# Patient Record
Sex: Female | Born: 1945
Health system: Southern US, Community
[De-identification: ages and names within clinical notes are randomized; demographics above are authoritative.]

## PROBLEM LIST (undated history)

## (undated) DIAGNOSIS — D75839 Thrombocytosis, unspecified: Secondary | ICD-10-CM

## (undated) DIAGNOSIS — D473 Essential (hemorrhagic) thrombocythemia: Secondary | ICD-10-CM

## (undated) DIAGNOSIS — F419 Anxiety disorder, unspecified: Secondary | ICD-10-CM

## (undated) DIAGNOSIS — G40909 Epilepsy, unspecified, not intractable, without status epilepticus: Secondary | ICD-10-CM

## (undated) DIAGNOSIS — I341 Nonrheumatic mitral (valve) prolapse: Secondary | ICD-10-CM

## (undated) DIAGNOSIS — M810 Age-related osteoporosis without current pathological fracture: Secondary | ICD-10-CM

## (undated) DIAGNOSIS — D126 Benign neoplasm of colon, unspecified: Secondary | ICD-10-CM

## (undated) DIAGNOSIS — D509 Iron deficiency anemia, unspecified: Secondary | ICD-10-CM

## (undated) DIAGNOSIS — K219 Gastro-esophageal reflux disease without esophagitis: Secondary | ICD-10-CM

## (undated) DIAGNOSIS — D472 Monoclonal gammopathy: Secondary | ICD-10-CM

## (undated) DIAGNOSIS — IMO0002 Reserved for concepts with insufficient information to code with codable children: Secondary | ICD-10-CM

## (undated) DIAGNOSIS — I10 Essential (primary) hypertension: Secondary | ICD-10-CM

## (undated) DIAGNOSIS — G40B09 Juvenile myoclonic epilepsy, not intractable, without status epilepticus: Secondary | ICD-10-CM

## (undated) DIAGNOSIS — C9000 Multiple myeloma not having achieved remission: Secondary | ICD-10-CM

## (undated) HISTORY — DX: Juvenile myoclonic epilepsy, not intractable, without status epilepticus: G40.B09

## (undated) HISTORY — DX: Gastro-esophageal reflux disease without esophagitis: K21.9

## (undated) HISTORY — DX: Thrombocytosis, unspecified: D75.839

## (undated) HISTORY — DX: Essential (primary) hypertension: I10

## (undated) HISTORY — DX: Reserved for concepts with insufficient information to code with codable children: IMO0002

## (undated) HISTORY — DX: Nonrheumatic mitral (valve) prolapse: I34.1

## (undated) HISTORY — DX: Multiple myeloma not having achieved remission: C90.00

## (undated) HISTORY — DX: Iron deficiency anemia, unspecified: D50.9

## (undated) HISTORY — DX: Benign neoplasm of colon, unspecified: D12.6

## (undated) HISTORY — DX: Age-related osteoporosis without current pathological fracture: M81.0

## (undated) HISTORY — DX: Anxiety disorder, unspecified: F41.9

## (undated) HISTORY — DX: Epilepsy, unspecified, not intractable, without status epilepticus: G40.909

## (undated) HISTORY — DX: Essential (hemorrhagic) thrombocythemia: D47.3

## (undated) HISTORY — DX: Monoclonal gammopathy: D47.2

---
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
## 1990-11-01 HISTORY — PX: BREAST BIOPSY: SHX20

## 1999-07-23 ENCOUNTER — Other Ambulatory Visit: Admission: RE | Admit: 1999-07-23 | Discharge: 1999-07-23 | Payer: Self-pay | Admitting: Obstetrics and Gynecology

## 2000-06-30 ENCOUNTER — Encounter: Payer: Self-pay | Admitting: Obstetrics and Gynecology

## 2000-06-30 ENCOUNTER — Encounter: Admission: RE | Admit: 2000-06-30 | Discharge: 2000-06-30 | Payer: Self-pay | Admitting: Obstetrics and Gynecology

## 2000-08-03 ENCOUNTER — Other Ambulatory Visit: Admission: RE | Admit: 2000-08-03 | Discharge: 2000-08-03 | Payer: Self-pay | Admitting: Obstetrics and Gynecology

## 2001-07-18 ENCOUNTER — Encounter: Admission: RE | Admit: 2001-07-18 | Discharge: 2001-07-18 | Payer: Self-pay | Admitting: Obstetrics and Gynecology

## 2001-07-18 ENCOUNTER — Encounter: Payer: Self-pay | Admitting: Obstetrics and Gynecology

## 2001-09-05 ENCOUNTER — Other Ambulatory Visit: Admission: RE | Admit: 2001-09-05 | Discharge: 2001-09-05 | Payer: Self-pay | Admitting: Obstetrics and Gynecology

## 2002-07-19 ENCOUNTER — Encounter: Payer: Self-pay | Admitting: Obstetrics and Gynecology

## 2002-07-19 ENCOUNTER — Encounter: Admission: RE | Admit: 2002-07-19 | Discharge: 2002-07-19 | Payer: Self-pay | Admitting: Obstetrics and Gynecology

## 2002-09-17 ENCOUNTER — Encounter: Payer: Self-pay | Admitting: Family Medicine

## 2002-09-17 ENCOUNTER — Ambulatory Visit (HOSPITAL_COMMUNITY): Admission: RE | Admit: 2002-09-17 | Discharge: 2002-09-17 | Payer: Self-pay | Admitting: Family Medicine

## 2002-11-07 ENCOUNTER — Other Ambulatory Visit: Admission: RE | Admit: 2002-11-07 | Discharge: 2002-11-07 | Payer: Self-pay | Admitting: Obstetrics and Gynecology

## 2003-01-15 ENCOUNTER — Ambulatory Visit (HOSPITAL_COMMUNITY)
Admission: RE | Admit: 2003-01-15 | Discharge: 2003-01-15 | Payer: Self-pay | Admitting: Thoracic Surgery (Cardiothoracic Vascular Surgery)

## 2003-01-15 ENCOUNTER — Encounter: Payer: Self-pay | Admitting: Thoracic Surgery (Cardiothoracic Vascular Surgery)

## 2003-01-15 ENCOUNTER — Encounter (INDEPENDENT_AMBULATORY_CARE_PROVIDER_SITE_OTHER): Payer: Self-pay | Admitting: *Deleted

## 2003-07-22 ENCOUNTER — Encounter: Payer: Self-pay | Admitting: Obstetrics and Gynecology

## 2003-07-22 ENCOUNTER — Encounter: Admission: RE | Admit: 2003-07-22 | Discharge: 2003-07-22 | Payer: Self-pay | Admitting: Obstetrics and Gynecology

## 2003-12-18 ENCOUNTER — Other Ambulatory Visit: Admission: RE | Admit: 2003-12-18 | Discharge: 2003-12-18 | Payer: Self-pay | Admitting: Obstetrics and Gynecology

## 2004-07-27 ENCOUNTER — Encounter: Admission: RE | Admit: 2004-07-27 | Discharge: 2004-07-27 | Payer: Self-pay | Admitting: Obstetrics and Gynecology

## 2004-12-14 ENCOUNTER — Ambulatory Visit (HOSPITAL_COMMUNITY): Admission: RE | Admit: 2004-12-14 | Discharge: 2004-12-14 | Payer: Self-pay | Admitting: Family Medicine

## 2005-03-10 ENCOUNTER — Ambulatory Visit: Payer: Self-pay | Admitting: Oncology

## 2005-07-14 ENCOUNTER — Other Ambulatory Visit: Admission: RE | Admit: 2005-07-14 | Discharge: 2005-07-14 | Payer: Self-pay | Admitting: Obstetrics and Gynecology

## 2005-08-05 ENCOUNTER — Encounter: Admission: RE | Admit: 2005-08-05 | Discharge: 2005-08-05 | Payer: Self-pay | Admitting: Obstetrics and Gynecology

## 2005-12-28 ENCOUNTER — Encounter: Admission: RE | Admit: 2005-12-28 | Discharge: 2005-12-28 | Payer: Self-pay | Admitting: Neurosurgery

## 2005-12-29 ENCOUNTER — Encounter: Admission: RE | Admit: 2005-12-29 | Discharge: 2005-12-29 | Payer: Self-pay | Admitting: Neurosurgery

## 2006-08-08 ENCOUNTER — Encounter: Admission: RE | Admit: 2006-08-08 | Discharge: 2006-08-08 | Payer: Self-pay | Admitting: Obstetrics and Gynecology

## 2007-08-10 ENCOUNTER — Encounter: Admission: RE | Admit: 2007-08-10 | Discharge: 2007-08-10 | Payer: Self-pay | Admitting: Obstetrics and Gynecology

## 2008-08-12 ENCOUNTER — Encounter: Admission: RE | Admit: 2008-08-12 | Discharge: 2008-08-12 | Payer: Self-pay | Admitting: Obstetrics and Gynecology

## 2008-11-25 ENCOUNTER — Ambulatory Visit: Payer: Self-pay | Admitting: Internal Medicine

## 2008-12-06 ENCOUNTER — Encounter: Payer: Self-pay | Admitting: Internal Medicine

## 2008-12-06 ENCOUNTER — Ambulatory Visit: Payer: Self-pay | Admitting: Internal Medicine

## 2008-12-09 ENCOUNTER — Encounter: Payer: Self-pay | Admitting: Internal Medicine

## 2009-08-11 ENCOUNTER — Encounter: Admission: RE | Admit: 2009-08-11 | Discharge: 2009-08-11 | Payer: Self-pay | Admitting: Neurosurgery

## 2009-08-13 ENCOUNTER — Encounter: Admission: RE | Admit: 2009-08-13 | Discharge: 2009-08-13 | Payer: Self-pay | Admitting: Obstetrics and Gynecology

## 2009-09-02 ENCOUNTER — Ambulatory Visit: Payer: Self-pay | Admitting: Oncology

## 2009-09-11 LAB — IRON AND TIBC
%SAT: 51 % (ref 20–55)
Iron: 161 ug/dL — ABNORMAL HIGH (ref 42–145)

## 2009-09-11 LAB — CBC WITH DIFFERENTIAL/PLATELET
BASO%: 0.9 % (ref 0.0–2.0)
Eosinophils Absolute: 0.1 10*3/uL (ref 0.0–0.5)
LYMPH%: 51 % — ABNORMAL HIGH (ref 14.0–49.7)
MCHC: 33.5 g/dL (ref 31.5–36.0)
MCV: 86.7 fL (ref 79.5–101.0)
MONO%: 9.7 % (ref 0.0–14.0)
Platelets: 107 10*3/uL — ABNORMAL LOW (ref 145–400)
RBC: 3.61 10*6/uL — ABNORMAL LOW (ref 3.70–5.45)
nRBC: 0 % (ref 0–0)

## 2009-09-11 LAB — RETICULOCYTES
Immature Retic Fract: 1.8 % (ref 0.00–10.70)
RBC: 3.63 10*6/uL — ABNORMAL LOW (ref 3.70–5.45)
Retic %: 0.95 % (ref 0.50–1.50)

## 2009-09-11 LAB — MORPHOLOGY: PLT EST: DECREASED

## 2009-09-16 LAB — COMPREHENSIVE METABOLIC PANEL
ALT: 32 U/L (ref 0–35)
BUN: 23 mg/dL (ref 6–23)
CO2: 21 mEq/L (ref 19–32)
Creatinine, Ser: 1.03 mg/dL (ref 0.40–1.20)
Total Bilirubin: 0.4 mg/dL (ref 0.3–1.2)

## 2009-09-16 LAB — SEDIMENTATION RATE: Sed Rate: 10 mm/hr (ref 0–22)

## 2009-09-16 LAB — JAK2 GENOTYPR: JAK2 GenotypR: NOT DETECTED

## 2009-09-16 LAB — LACTATE DEHYDROGENASE: LDH: 146 U/L (ref 94–250)

## 2009-09-17 LAB — CBC WITH DIFFERENTIAL/PLATELET
BASO%: 0.7 % (ref 0.0–2.0)
Eosinophils Absolute: 0.1 10*3/uL (ref 0.0–0.5)
MCHC: 33.3 g/dL (ref 31.5–36.0)
MONO#: 0.5 10*3/uL (ref 0.1–0.9)
NEUT#: 1.7 10*3/uL (ref 1.5–6.5)
RBC: 3.08 10*6/uL — ABNORMAL LOW (ref 3.70–5.45)
WBC: 4.9 10*3/uL (ref 3.9–10.3)
lymph#: 2.6 10*3/uL (ref 0.9–3.3)

## 2009-09-24 LAB — CBC WITH DIFFERENTIAL/PLATELET
BASO%: 1.1 % (ref 0.0–2.0)
Basophils Absolute: 0 10*3/uL (ref 0.0–0.1)
Eosinophils Absolute: 0.2 10*3/uL (ref 0.0–0.5)
HCT: 28.8 % — ABNORMAL LOW (ref 34.8–46.6)
HGB: 9.6 g/dL — ABNORMAL LOW (ref 11.6–15.9)
MONO#: 0.3 10*3/uL (ref 0.1–0.9)
NEUT#: 2.1 10*3/uL (ref 1.5–6.5)
NEUT%: 47.1 % (ref 38.4–76.8)
Platelets: 397 10*3/uL (ref 145–400)
WBC: 4.5 10*3/uL (ref 3.9–10.3)
lymph#: 1.8 10*3/uL (ref 0.9–3.3)

## 2009-10-01 LAB — CBC WITH DIFFERENTIAL/PLATELET
Basophils Absolute: 0 10*3/uL (ref 0.0–0.1)
HCT: 28.7 % — ABNORMAL LOW (ref 34.8–46.6)
HGB: 9.9 g/dL — ABNORMAL LOW (ref 11.6–15.9)
MONO#: 0.4 10*3/uL (ref 0.1–0.9)
NEUT#: 2.1 10*3/uL (ref 1.5–6.5)
NEUT%: 52.9 % (ref 38.4–76.8)
RDW: 15 % — ABNORMAL HIGH (ref 11.2–14.5)
WBC: 4 10*3/uL (ref 3.9–10.3)
lymph#: 1.3 10*3/uL (ref 0.9–3.3)

## 2009-10-03 ENCOUNTER — Ambulatory Visit: Payer: Self-pay | Admitting: Oncology

## 2009-10-07 LAB — COMPREHENSIVE METABOLIC PANEL
ALT: 32 U/L (ref 0–35)
Albumin: 3.2 g/dL — ABNORMAL LOW (ref 3.5–5.2)
CO2: 27 mEq/L (ref 19–32)
Calcium: 8.4 mg/dL (ref 8.4–10.5)
Chloride: 102 mEq/L (ref 96–112)
Creatinine, Ser: 1.19 mg/dL (ref 0.40–1.20)
Potassium: 4.7 mEq/L (ref 3.5–5.3)
Sodium: 135 mEq/L (ref 135–145)
Total Protein: 6 g/dL (ref 6.0–8.3)

## 2009-10-07 LAB — CBC WITH DIFFERENTIAL/PLATELET
BASO%: 0.8 % (ref 0.0–2.0)
HCT: 28.9 % — ABNORMAL LOW (ref 34.8–46.6)
MCHC: 33.9 g/dL (ref 31.5–36.0)
MONO#: 0.4 10*3/uL (ref 0.1–0.9)
NEUT%: 31.3 % — ABNORMAL LOW (ref 38.4–76.8)
RDW: 14.3 % (ref 11.2–14.5)
WBC: 4.3 10*3/uL (ref 3.9–10.3)
lymph#: 2.4 10*3/uL (ref 0.9–3.3)

## 2009-10-07 LAB — LACTATE DEHYDROGENASE: LDH: 136 U/L (ref 94–250)

## 2009-10-08 ENCOUNTER — Encounter: Admission: RE | Admit: 2009-10-08 | Discharge: 2009-10-08 | Payer: Self-pay | Admitting: Neurosurgery

## 2009-10-13 LAB — IMMUNOFIXATION ELECTROPHORESIS

## 2009-10-15 LAB — CREATININE CLEARANCE, URINE, 24 HOUR
Collection Interval-CRCL: 24 hours
Creatinine Clearance: 64 mL/min — ABNORMAL LOW (ref 75–115)
Creatinine, Urine: 47 mg/dL

## 2009-10-15 LAB — UIFE/LIGHT CHAINS/TP QN, 24-HR UR
Alpha 2, Urine: DETECTED — AB
Beta, Urine: DETECTED — AB
Free Kappa Lt Chains,Ur: 1.98 mg/dL — ABNORMAL HIGH (ref 0.04–1.51)
Free Kappa/Lambda Ratio: 6 ratio — ABNORMAL HIGH (ref 0.46–4.00)
Free Lt Chn Excr Rate: 46.53 mg/d
Total Protein, Urine: 2.8 mg/dL

## 2009-10-27 LAB — CBC WITH DIFFERENTIAL/PLATELET
BASO%: 1.4 % (ref 0.0–2.0)
MCHC: 33.1 g/dL (ref 31.5–36.0)
MONO#: 0.4 10*3/uL (ref 0.1–0.9)
RBC: 3.62 10*6/uL — ABNORMAL LOW (ref 3.70–5.45)
RDW: 13.7 % (ref 11.2–14.5)
WBC: 3.6 10*3/uL — ABNORMAL LOW (ref 3.9–10.3)
lymph#: 2.2 10*3/uL (ref 0.9–3.3)

## 2009-10-30 LAB — CBC WITH DIFFERENTIAL/PLATELET
BASO%: 0.9 % (ref 0.0–2.0)
Basophils Absolute: 0 10*3/uL (ref 0.0–0.1)
HCT: 30 % — ABNORMAL LOW (ref 34.8–46.6)
HGB: 10.2 g/dL — ABNORMAL LOW (ref 11.6–15.9)
MONO#: 0.4 10*3/uL (ref 0.1–0.9)
NEUT%: 42.2 % (ref 38.4–76.8)
WBC: 4.5 10*3/uL (ref 3.9–10.3)
lymph#: 2 10*3/uL (ref 0.9–3.3)

## 2009-10-30 LAB — MORPHOLOGY: PLT EST: ADEQUATE

## 2009-11-04 ENCOUNTER — Ambulatory Visit: Payer: Self-pay | Admitting: Oncology

## 2009-11-04 ENCOUNTER — Ambulatory Visit (HOSPITAL_COMMUNITY): Admission: RE | Admit: 2009-11-04 | Discharge: 2009-11-04 | Payer: Self-pay | Admitting: Oncology

## 2009-11-06 ENCOUNTER — Ambulatory Visit: Payer: Self-pay | Admitting: Oncology

## 2009-11-10 LAB — COMPREHENSIVE METABOLIC PANEL
ALT: 37 U/L — ABNORMAL HIGH (ref 0–35)
AST: 24 U/L (ref 0–37)
Calcium: 8.5 mg/dL (ref 8.4–10.5)
Chloride: 102 mEq/L (ref 96–112)
Creatinine, Ser: 1 mg/dL (ref 0.40–1.20)
Sodium: 134 mEq/L — ABNORMAL LOW (ref 135–145)

## 2009-11-10 LAB — CBC WITH DIFFERENTIAL/PLATELET
BASO%: 0.8 % (ref 0.0–2.0)
EOS%: 2.2 % (ref 0.0–7.0)
HCT: 31 % — ABNORMAL LOW (ref 34.8–46.6)
MCH: 31 pg (ref 25.1–34.0)
MCHC: 34 g/dL (ref 31.5–36.0)
NEUT%: 44.3 % (ref 38.4–76.8)
RBC: 3.39 10*6/uL — ABNORMAL LOW (ref 3.70–5.45)
RDW: 14.3 % (ref 11.2–14.5)
lymph#: 1.4 10*3/uL (ref 0.9–3.3)

## 2009-11-18 ENCOUNTER — Ambulatory Visit (HOSPITAL_COMMUNITY): Admission: RE | Admit: 2009-11-18 | Discharge: 2009-11-18 | Payer: Self-pay | Admitting: Oncology

## 2009-12-09 ENCOUNTER — Ambulatory Visit: Payer: Self-pay | Admitting: Oncology

## 2009-12-11 LAB — CBC WITH DIFFERENTIAL/PLATELET
BASO%: 1.1 % (ref 0.0–2.0)
Basophils Absolute: 0 10*3/uL (ref 0.0–0.1)
Eosinophils Absolute: 0.1 10*3/uL (ref 0.0–0.5)
HCT: 33 % — ABNORMAL LOW (ref 34.8–46.6)
HGB: 11.3 g/dL — ABNORMAL LOW (ref 11.6–15.9)
LYMPH%: 49.8 % — ABNORMAL HIGH (ref 14.0–49.7)
MONO#: 0.4 10*3/uL (ref 0.1–0.9)
NEUT#: 1.4 10*3/uL — ABNORMAL LOW (ref 1.5–6.5)
NEUT%: 37 % — ABNORMAL LOW (ref 38.4–76.8)
Platelets: 354 10*3/uL (ref 145–400)
WBC: 3.7 10*3/uL — ABNORMAL LOW (ref 3.9–10.3)
lymph#: 1.8 10*3/uL (ref 0.9–3.3)

## 2010-01-08 ENCOUNTER — Ambulatory Visit: Payer: Self-pay | Admitting: Oncology

## 2010-01-08 LAB — CBC WITH DIFFERENTIAL/PLATELET
BASO%: 1 % (ref 0.0–2.0)
Eosinophils Absolute: 0.1 10*3/uL (ref 0.0–0.5)
LYMPH%: 58.7 % — ABNORMAL HIGH (ref 14.0–49.7)
MCHC: 34.3 g/dL (ref 31.5–36.0)
MONO#: 0.4 10*3/uL (ref 0.1–0.9)
NEUT#: 1.3 10*3/uL — ABNORMAL LOW (ref 1.5–6.5)
Platelets: 332 10*3/uL (ref 145–400)
RBC: 3.46 10*6/uL — ABNORMAL LOW (ref 3.70–5.45)
WBC: 4.6 10*3/uL (ref 3.9–10.3)
lymph#: 2.7 10*3/uL (ref 0.9–3.3)

## 2010-02-12 ENCOUNTER — Ambulatory Visit: Payer: Self-pay | Admitting: Oncology

## 2010-02-16 LAB — CBC WITH DIFFERENTIAL/PLATELET
BASO%: 1 % (ref 0.0–2.0)
Basophils Absolute: 0 10*3/uL (ref 0.0–0.1)
EOS%: 1.8 % (ref 0.0–7.0)
Eosinophils Absolute: 0.1 10*3/uL (ref 0.0–0.5)
HCT: 32.1 % — ABNORMAL LOW (ref 34.8–46.6)
HGB: 11.1 g/dL — ABNORMAL LOW (ref 11.6–15.9)
LYMPH%: 40.2 % (ref 14.0–49.7)
MCH: 31.6 pg (ref 25.1–34.0)
MCHC: 34.6 g/dL (ref 31.5–36.0)
MCV: 91.3 fL (ref 79.5–101.0)
MONO%: 10.4 % (ref 0.0–14.0)
NEUT#: 1.7 10*3/uL (ref 1.5–6.5)
NEUT%: 46.6 % (ref 38.4–76.8)
Platelets: 332 10*3/uL (ref 145–400)
RDW: 14.1 % (ref 11.2–14.5)

## 2010-02-17 LAB — PROTEIN / CREATININE RATIO, URINE
Creatinine, Urine: 147.5 mg/dL
Protein Creatinine Ratio: 0.05 (ref <0.15)

## 2010-02-25 LAB — COMPREHENSIVE METABOLIC PANEL
AST: 22 U/L (ref 0–37)
Albumin: 4.2 g/dL (ref 3.5–5.2)
Alkaline Phosphatase: 108 U/L (ref 39–117)
BUN: 33 mg/dL — ABNORMAL HIGH (ref 6–23)
Calcium: 8.7 mg/dL (ref 8.4–10.5)
Creatinine, Ser: 1.16 mg/dL (ref 0.40–1.20)
Glucose, Bld: 111 mg/dL — ABNORMAL HIGH (ref 70–99)

## 2010-02-25 LAB — IRON AND TIBC
TIBC: 315 ug/dL (ref 250–470)
UIBC: 149 ug/dL

## 2010-02-25 LAB — IGG, IGA, IGM
IgA: 100 mg/dL (ref 68–378)
IgG (Immunoglobin G), Serum: 629 mg/dL — ABNORMAL LOW (ref 694–1618)
IgM, Serum: 47 mg/dL — ABNORMAL LOW (ref 60–263)

## 2010-02-25 LAB — TRANSFERRIN RECEPTOR, SOLUABLE: Transferrin Receptor, Soluble: 8.9 nmol/L

## 2010-03-18 ENCOUNTER — Ambulatory Visit: Payer: Self-pay | Admitting: Oncology

## 2010-03-19 LAB — CBC WITH DIFFERENTIAL/PLATELET
Basophils Absolute: 0 10*3/uL (ref 0.0–0.1)
EOS%: 2.3 % (ref 0.0–7.0)
Eosinophils Absolute: 0.1 10*3/uL (ref 0.0–0.5)
HCT: 32.5 % — ABNORMAL LOW (ref 34.8–46.6)
HGB: 11.3 g/dL — ABNORMAL LOW (ref 11.6–15.9)
MCH: 31.6 pg (ref 25.1–34.0)
MONO#: 0.6 10*3/uL (ref 0.1–0.9)
NEUT#: 1.6 10*3/uL (ref 1.5–6.5)
NEUT%: 33.1 % — ABNORMAL LOW (ref 38.4–76.8)
lymph#: 2.6 10*3/uL (ref 0.9–3.3)

## 2010-04-16 LAB — CBC WITH DIFFERENTIAL/PLATELET
BASO%: 0.8 % (ref 0.0–2.0)
HCT: 32.1 % — ABNORMAL LOW (ref 34.8–46.6)
HGB: 11.2 g/dL — ABNORMAL LOW (ref 11.6–15.9)
MCHC: 34.9 g/dL (ref 31.5–36.0)
MONO#: 0.5 10*3/uL (ref 0.1–0.9)
NEUT%: 37.1 % — ABNORMAL LOW (ref 38.4–76.8)
RDW: 13.8 % (ref 11.2–14.5)
WBC: 4.6 10*3/uL (ref 3.9–10.3)
lymph#: 2.2 10*3/uL (ref 0.9–3.3)

## 2010-05-19 ENCOUNTER — Ambulatory Visit: Payer: Self-pay | Admitting: Oncology

## 2010-05-21 LAB — CBC WITH DIFFERENTIAL/PLATELET
Basophils Absolute: 0 10*3/uL (ref 0.0–0.1)
Eosinophils Absolute: 0.1 10*3/uL (ref 0.0–0.5)
HGB: 11.3 g/dL — ABNORMAL LOW (ref 11.6–15.9)
LYMPH%: 51.9 % — ABNORMAL HIGH (ref 14.0–49.7)
MCV: 90.7 fL (ref 79.5–101.0)
MONO%: 14.2 % — ABNORMAL HIGH (ref 0.0–14.0)
NEUT#: 1.2 10*3/uL — ABNORMAL LOW (ref 1.5–6.5)
Platelets: 388 10*3/uL (ref 145–400)
RBC: 3.64 10*6/uL — ABNORMAL LOW (ref 3.70–5.45)

## 2010-05-22 LAB — COMPREHENSIVE METABOLIC PANEL
Alkaline Phosphatase: 105 U/L (ref 39–117)
BUN: 29 mg/dL — ABNORMAL HIGH (ref 6–23)
Creatinine, Ser: 1.09 mg/dL (ref 0.40–1.20)
Glucose, Bld: 111 mg/dL — ABNORMAL HIGH (ref 70–99)
Total Bilirubin: 0.3 mg/dL (ref 0.3–1.2)

## 2010-05-22 LAB — IRON AND TIBC: Iron: 127 ug/dL (ref 42–145)

## 2010-05-22 LAB — FERRITIN: Ferritin: 129 ng/mL (ref 10–291)

## 2010-05-22 LAB — PROTEIN / CREATININE RATIO, URINE: Protein Creatinine Ratio: 0.06 (ref <0.15)

## 2010-05-22 LAB — IGG, IGA, IGM
IgA: 91 mg/dL (ref 68–378)
IgG (Immunoglobin G), Serum: 650 mg/dL — ABNORMAL LOW (ref 694–1618)

## 2010-05-22 LAB — TRANSFERRIN RECEPTOR, SOLUABLE: Transferrin Receptor, Soluble: 7.8 nmol/L

## 2010-08-18 ENCOUNTER — Encounter: Admission: RE | Admit: 2010-08-18 | Discharge: 2010-08-18 | Payer: Self-pay | Admitting: Obstetrics and Gynecology

## 2010-09-17 ENCOUNTER — Ambulatory Visit: Payer: Self-pay | Admitting: Oncology

## 2010-09-21 LAB — PROTEIN / CREATININE RATIO, URINE
Creatinine, Urine: 83.9 mg/dL
Protein Creatinine Ratio: 0.07 (ref <0.15)

## 2010-09-21 LAB — CBC WITH DIFFERENTIAL/PLATELET
Basophils Absolute: 0 10*3/uL (ref 0.0–0.1)
Eosinophils Absolute: 0.1 10*3/uL (ref 0.0–0.5)
HGB: 11.5 g/dL — ABNORMAL LOW (ref 11.6–15.9)
MCV: 90.7 fL (ref 79.5–101.0)
MONO#: 0.4 10*3/uL (ref 0.1–0.9)
MONO%: 12.4 % (ref 0.0–14.0)
NEUT#: 1.1 10*3/uL — ABNORMAL LOW (ref 1.5–6.5)
RDW: 14.2 % (ref 11.2–14.5)
WBC: 3.2 10*3/uL — ABNORMAL LOW (ref 3.9–10.3)
lymph#: 1.6 10*3/uL (ref 0.9–3.3)

## 2010-09-22 LAB — IGG, IGA, IGM
IgA: 95 mg/dL (ref 68–378)
IgG (Immunoglobin G), Serum: 625 mg/dL — ABNORMAL LOW (ref 694–1618)

## 2010-09-22 LAB — COMPREHENSIVE METABOLIC PANEL
ALT: 30 U/L (ref 0–35)
Albumin: 3.9 g/dL (ref 3.5–5.2)
CO2: 23 mEq/L (ref 19–32)
Calcium: 8.9 mg/dL (ref 8.4–10.5)
Chloride: 98 mEq/L (ref 96–112)
Glucose, Bld: 142 mg/dL — ABNORMAL HIGH (ref 70–99)
Potassium: 4.5 mEq/L (ref 3.5–5.3)
Sodium: 131 mEq/L — ABNORMAL LOW (ref 135–145)
Total Protein: 6.3 g/dL (ref 6.0–8.3)

## 2010-09-22 LAB — IRON AND TIBC: %SAT: 54 % (ref 20–55)

## 2010-09-22 LAB — TRANSFERRIN RECEPTOR, SOLUABLE: Transferrin Receptor, Soluble: 8 nmol/L

## 2010-10-19 ENCOUNTER — Ambulatory Visit: Payer: Self-pay | Admitting: Oncology

## 2010-10-19 LAB — CBC WITH DIFFERENTIAL/PLATELET
Basophils Absolute: 0 10*3/uL (ref 0.0–0.1)
EOS%: 2.8 % (ref 0.0–7.0)
HCT: 33.5 % — ABNORMAL LOW (ref 34.8–46.6)
HGB: 11.4 g/dL — ABNORMAL LOW (ref 11.6–15.9)
LYMPH%: 57.6 % — ABNORMAL HIGH (ref 14.0–49.7)
MCH: 30.9 pg (ref 25.1–34.0)
MCHC: 34.2 g/dL (ref 31.5–36.0)
MCV: 90.6 fL (ref 79.5–101.0)
NEUT%: 29.5 % — ABNORMAL LOW (ref 38.4–76.8)
Platelets: 390 10*3/uL (ref 145–400)
lymph#: 2.9 10*3/uL (ref 0.9–3.3)

## 2011-01-17 LAB — CBC
MCHC: 33.1 g/dL (ref 30.0–36.0)
RBC: 3.48 MIL/uL — ABNORMAL LOW (ref 3.87–5.11)

## 2011-01-17 LAB — BONE MARROW EXAM: Bone Marrow Exam: 2

## 2011-01-17 LAB — DIFFERENTIAL
Basophils Absolute: 0 10*3/uL (ref 0.0–0.1)
Lymphs Abs: 2.1 10*3/uL (ref 0.7–4.0)
Monocytes Absolute: 0.3 10*3/uL (ref 0.1–1.0)
Monocytes Relative: 9 % (ref 3–12)
Neutrophils Relative %: 24 % — ABNORMAL LOW (ref 43–77)

## 2011-01-17 LAB — TISSUE HYBRIDIZATION (BONE MARROW)-NCBH

## 2011-01-17 LAB — CHROMOSOME ANALYSIS, BONE MARROW

## 2011-01-22 ENCOUNTER — Other Ambulatory Visit (HOSPITAL_COMMUNITY): Payer: Self-pay | Admitting: Oncology

## 2011-01-22 ENCOUNTER — Encounter (HOSPITAL_BASED_OUTPATIENT_CLINIC_OR_DEPARTMENT_OTHER): Payer: Managed Care, Other (non HMO) | Admitting: Oncology

## 2011-01-22 DIAGNOSIS — D473 Essential (hemorrhagic) thrombocythemia: Secondary | ICD-10-CM

## 2011-01-22 DIAGNOSIS — D649 Anemia, unspecified: Secondary | ICD-10-CM

## 2011-01-22 DIAGNOSIS — D472 Monoclonal gammopathy: Secondary | ICD-10-CM

## 2011-01-22 LAB — IRON AND TIBC
%SAT: 55 % (ref 20–55)
Iron: 156 ug/dL — ABNORMAL HIGH (ref 42–145)
TIBC: 285 ug/dL (ref 250–470)

## 2011-01-22 LAB — CBC WITH DIFFERENTIAL/PLATELET
BASO%: 0.8 % (ref 0.0–2.0)
LYMPH%: 50.8 % — ABNORMAL HIGH (ref 14.0–49.7)
MCHC: 34.1 g/dL (ref 31.5–36.0)
MCV: 91.9 fL (ref 79.5–101.0)
MONO%: 9.9 % (ref 0.0–14.0)
Platelets: 329 10*3/uL (ref 145–400)
RBC: 3.49 10*6/uL — ABNORMAL LOW (ref 3.70–5.45)
WBC: 4.4 10*3/uL (ref 3.9–10.3)

## 2011-01-22 LAB — FERRITIN: Ferritin: 199 ng/mL (ref 10–291)

## 2011-01-22 LAB — COMPREHENSIVE METABOLIC PANEL
ALT: 41 U/L — ABNORMAL HIGH (ref 0–35)
AST: 27 U/L (ref 0–37)
Alkaline Phosphatase: 105 U/L (ref 39–117)
Sodium: 131 mEq/L — ABNORMAL LOW (ref 135–145)
Total Bilirubin: 0.6 mg/dL (ref 0.3–1.2)
Total Protein: 6.1 g/dL (ref 6.0–8.3)

## 2011-06-04 ENCOUNTER — Other Ambulatory Visit (HOSPITAL_COMMUNITY): Payer: Self-pay | Admitting: Oncology

## 2011-06-04 ENCOUNTER — Encounter (HOSPITAL_BASED_OUTPATIENT_CLINIC_OR_DEPARTMENT_OTHER): Payer: Managed Care, Other (non HMO) | Admitting: Oncology

## 2011-06-04 DIAGNOSIS — D649 Anemia, unspecified: Secondary | ICD-10-CM

## 2011-06-04 DIAGNOSIS — D472 Monoclonal gammopathy: Secondary | ICD-10-CM

## 2011-06-04 DIAGNOSIS — D473 Essential (hemorrhagic) thrombocythemia: Secondary | ICD-10-CM

## 2011-06-04 LAB — CBC WITH DIFFERENTIAL/PLATELET
BASO%: 1.3 % (ref 0.0–2.0)
HCT: 35.1 % (ref 34.8–46.6)
LYMPH%: 52.2 % — ABNORMAL HIGH (ref 14.0–49.7)
MCH: 31.3 pg (ref 25.1–34.0)
MCHC: 33.9 g/dL (ref 31.5–36.0)
MONO#: 0.4 10*3/uL (ref 0.1–0.9)
NEUT%: 32.2 % — ABNORMAL LOW (ref 38.4–76.8)
Platelets: 309 10*3/uL (ref 145–400)
WBC: 3.3 10*3/uL — ABNORMAL LOW (ref 3.9–10.3)

## 2011-06-05 LAB — IRON AND TIBC: %SAT: 53 % (ref 20–55)

## 2011-06-05 LAB — COMPREHENSIVE METABOLIC PANEL
ALT: 34 U/L (ref 0–35)
Albumin: 4 g/dL (ref 3.5–5.2)
CO2: 22 mEq/L (ref 19–32)
Calcium: 8.8 mg/dL (ref 8.4–10.5)
Chloride: 103 mEq/L (ref 96–112)
Creatinine, Ser: 0.94 mg/dL (ref 0.50–1.10)
Potassium: 4.8 mEq/L (ref 3.5–5.3)
Sodium: 135 mEq/L (ref 135–145)
Total Protein: 6.3 g/dL (ref 6.0–8.3)

## 2011-06-05 LAB — IGG, IGA, IGM: IgG (Immunoglobin G), Serum: 616 mg/dL — ABNORMAL LOW (ref 690–1700)

## 2011-06-05 LAB — LACTATE DEHYDROGENASE: LDH: 141 U/L (ref 94–250)

## 2011-07-22 ENCOUNTER — Other Ambulatory Visit: Payer: Self-pay | Admitting: Obstetrics and Gynecology

## 2011-07-22 DIAGNOSIS — Z1231 Encounter for screening mammogram for malignant neoplasm of breast: Secondary | ICD-10-CM

## 2011-08-20 ENCOUNTER — Ambulatory Visit
Admission: RE | Admit: 2011-08-20 | Discharge: 2011-08-20 | Disposition: A | Discharge status: Home / Self Care | Payer: Managed Care, Other (non HMO) | Source: Ambulatory Visit | Attending: Obstetrics and Gynecology | Admitting: Obstetrics and Gynecology

## 2011-08-20 DIAGNOSIS — Z1231 Encounter for screening mammogram for malignant neoplasm of breast: Secondary | ICD-10-CM

## 2011-08-25 ENCOUNTER — Other Ambulatory Visit: Payer: Self-pay | Admitting: Obstetrics and Gynecology

## 2011-08-25 DIAGNOSIS — R928 Other abnormal and inconclusive findings on diagnostic imaging of breast: Secondary | ICD-10-CM

## 2011-09-07 ENCOUNTER — Ambulatory Visit
Admission: RE | Admit: 2011-09-07 | Discharge: 2011-09-07 | Disposition: A | Discharge status: Home / Self Care | Payer: Managed Care, Other (non HMO) | Source: Ambulatory Visit | Attending: Obstetrics and Gynecology | Admitting: Obstetrics and Gynecology

## 2011-09-07 DIAGNOSIS — R928 Other abnormal and inconclusive findings on diagnostic imaging of breast: Secondary | ICD-10-CM

## 2011-10-13 ENCOUNTER — Telehealth: Payer: Self-pay | Admitting: Oncology

## 2011-10-13 NOTE — Telephone Encounter (Signed)
S/w pt, gave appt 12/14/11 @ 8.30am r/s'd from 12/4 appt cx'd due to epic.

## 2011-12-14 ENCOUNTER — Telehealth: Payer: Self-pay | Admitting: Oncology

## 2011-12-14 ENCOUNTER — Encounter: Payer: Self-pay | Admitting: Oncology

## 2011-12-14 ENCOUNTER — Other Ambulatory Visit (HOSPITAL_BASED_OUTPATIENT_CLINIC_OR_DEPARTMENT_OTHER): Payer: Managed Care, Other (non HMO) | Admitting: Lab

## 2011-12-14 ENCOUNTER — Ambulatory Visit (HOSPITAL_BASED_OUTPATIENT_CLINIC_OR_DEPARTMENT_OTHER): Payer: Managed Care, Other (non HMO) | Admitting: Oncology

## 2011-12-14 DIAGNOSIS — D473 Essential (hemorrhagic) thrombocythemia: Secondary | ICD-10-CM

## 2011-12-14 DIAGNOSIS — D72819 Decreased white blood cell count, unspecified: Secondary | ICD-10-CM

## 2011-12-14 DIAGNOSIS — D509 Iron deficiency anemia, unspecified: Secondary | ICD-10-CM

## 2011-12-14 DIAGNOSIS — D649 Anemia, unspecified: Secondary | ICD-10-CM

## 2011-12-14 DIAGNOSIS — D472 Monoclonal gammopathy: Secondary | ICD-10-CM

## 2011-12-14 HISTORY — DX: Monoclonal gammopathy: D47.2

## 2011-12-14 HISTORY — DX: Iron deficiency anemia, unspecified: D50.9

## 2011-12-14 HISTORY — DX: Decreased white blood cell count, unspecified: D72.819

## 2011-12-14 LAB — CBC WITH DIFFERENTIAL/PLATELET
BASO%: 1 % (ref 0.0–2.0)
EOS%: 3.9 % (ref 0.0–7.0)
Eosinophils Absolute: 0.2 10*3/uL (ref 0.0–0.5)
LYMPH%: 52.3 % — ABNORMAL HIGH (ref 14.0–49.7)
MCHC: 34.1 g/dL (ref 31.5–36.0)
MCV: 92.7 fL (ref 79.5–101.0)
MONO%: 10.6 % (ref 0.0–14.0)
NEUT#: 1.4 10*3/uL — ABNORMAL LOW (ref 1.5–6.5)
RBC: 3.79 10*6/uL (ref 3.70–5.45)
RDW: 14.1 % (ref 11.2–14.5)
WBC: 4.2 10*3/uL (ref 3.9–10.3)

## 2011-12-14 LAB — PROTEIN / CREATININE RATIO, URINE: Creatinine, Urine: 110 mg/dL

## 2011-12-14 NOTE — Progress Notes (Signed)
This office note has been dictated.  #[id number]

## 2011-12-14 NOTE — Telephone Encounter (Signed)
gv pt appt schedule for aug °

## 2011-12-14 NOTE — Progress Notes (Signed)
CC:   Dario Guardian, M.D.  PROBLEM LIST: 1. Mild leukopenia and neutropenia dating back, at least, to early     2011. 2. History of the iron-deficiency anemia. 3. History of thrombocytosis dating back to 1999, JAK2 mutation     negative. 4. IgG lambda monoclonal gammopathy.  The bone marrow on 11/04/2009     showed 13% plasma cells.  Metastatic bone survey in January 2011     was negative. 5. Hypertension. 6. History of epilepsy.  The patient has not had a seizure since 1987. 7. Degenerative disk disease with herniated disk at L5-S1. 8. Osteoporosis. 9. History of mild hyponatremia. 10.Tubular adenoma of the colon on 12/06/2008. 11.History of anxiety.  MEDICATIONS: 1. Caltrate 600 plus D 1 twice a day. 2. Vitamin D 1,000 units daily. 3. Lexapro 20 mg daily. 4. Ferrous sulfate 325 mg daily. 5. Multivitamins 1 tab daily. 6. Benicar 20 mg daily. 7. Dilantin 200 mg in the morning and 100 mg in the evening.  HISTORY:  Catherine Bullock is here today with her husband.  She is now 66 years old.  She was last seen by [place] on 06/04/2011.  She continues to do well with no significant problems.  She is having some neck discomfort, which she attributes to her degenerative disk disease and osteoporosis. She has had no infections or fever.  No significant medical changes other than having vitamin D added to her medicines a few months ago because of a bone density scan carried out by Dr. Marcelle Overlie showing osteoporosis, particularly in neck region.  The patient had a Pap test and mammogram in the last few months, all of which were negative.  She is without other complaints today.  PHYSICAL EXAMINATION:  General Appearance:  She is thin.  She looks well.  Weight is stable at 126 pounds and 6.4 ounces.  Height is 5 feet and 2-1/2 inches.  Body surface area is 1.59 m2.  Vital Signs:  Blood pressure 146/80.  Other vital signs are normal.  HEENT:  There is no scleral icterus.  She wears glasses.   Mouth and pharynx are benign. There is no peripheral adenopathy palpable.  Lungs:  Clear to percussion and auscultation.  Cardiac Exam:  I believe she has a very soft systolic ejection murmur, which we have heard in the past.  Breasts:  Not examined.  Abdomen:  Soft, scaphoid, and nontender with no organomegaly or masses palpable.  Extremities:  No peripheral edema or clubbing. Neurologic Exam:  Grossly normal.  Skin:  Without lesions.  LABORATORY DATA TODAY:  White count 4.2, ANC 1.4, hemoglobin 12, hematocrit 35.1, platelets 341,000.  There are 32% neutrophils, 52% lymphocytes.  Chemistries, iron studies, quantitative immunoglobulins, and urine protein-to-creatinine ratio are pending.  Chemistries from 06/04/2011 were normal, except for a glucose of 132.  Sodium, which has been low in the past, was 135,.  Iron saturation 53%, ferritin 174.  IgG level 616, which is slightly low with normal being 690-1,700.  IgA level 98, which is normal.  IgM 40 with normal being 52-322.  IMAGING STUDIES: 1. Metastatic bone survey on 11/18/2009 was negative. 2. Bilateral screening mammograms from 08/20/2011 showed a possible     mass in right breast.  Additional evaluation was indicated. 3. Digital diagnostic right limited mammogram with CAD on 09/07/2011     showed no persistent worrisome abnormality upon additional imaging     of the right breast. 4. The patient had a bone density scan in the office  of Dr. Marcelle Overlie.     The patient will try to obtain that report for [place].  IMPRESSION AND PLAN:  [name] continues to do well.  She has some minor abnormalities of her blood work, as noted above, none of which seems to be clinically significant.  The patient was reassured.  Of note is the fact that the patient had a tubular adenoma of the colon on 12/06/2008.  I suggested she may want to check with Dr. Juanda Chance as to see whether she needs another colonoscopy since it has been 3 years, apparently, since  her prior colonoscopy.  I have asked Mrs. Munyan to return in 6 months at which time we will check CBC, chemistries, iron studies, and quantitative immunoglobulins.    ______________________________ Samul Dada, M.D. DSM/MEDQ  D:  12/14/2011  T:  12/14/2011  Job:  [id number]

## 2011-12-15 LAB — FERRITIN: Ferritin: 187 ng/mL (ref 10–291)

## 2011-12-15 LAB — IGG, IGA, IGM: IgG (Immunoglobin G), Serum: 578 mg/dL — ABNORMAL LOW (ref 690–1700)

## 2011-12-15 LAB — IRON AND TIBC
%SAT: 61 % — ABNORMAL HIGH (ref 20–55)
Iron: 175 ug/dL — ABNORMAL HIGH (ref 42–145)
TIBC: 289 ug/dL (ref 250–470)
UIBC: 114 ug/dL — ABNORMAL LOW (ref 125–400)

## 2011-12-15 LAB — COMPREHENSIVE METABOLIC PANEL
BUN: 23 mg/dL (ref 6–23)
CO2: 23 mEq/L (ref 19–32)
Calcium: 9 mg/dL (ref 8.4–10.5)
Chloride: 103 mEq/L (ref 96–112)
Creatinine, Ser: 1.05 mg/dL (ref 0.50–1.10)
Glucose, Bld: 105 mg/dL — ABNORMAL HIGH (ref 70–99)
Total Bilirubin: 0.3 mg/dL (ref 0.3–1.2)

## 2011-12-15 LAB — LACTATE DEHYDROGENASE: LDH: 146 U/L (ref 94–250)

## 2012-05-15 ENCOUNTER — Telehealth: Payer: Self-pay | Admitting: Oncology

## 2012-05-15 NOTE — Telephone Encounter (Signed)
called pt with appts,r/s  aom

## 2012-06-12 ENCOUNTER — Other Ambulatory Visit: Payer: Managed Care, Other (non HMO) | Admitting: Lab

## 2012-06-12 ENCOUNTER — Ambulatory Visit: Payer: Managed Care, Other (non HMO) | Admitting: Family

## 2012-06-20 ENCOUNTER — Encounter: Payer: Self-pay | Admitting: Family

## 2012-06-20 ENCOUNTER — Telehealth: Payer: Self-pay | Admitting: Oncology

## 2012-06-20 ENCOUNTER — Ambulatory Visit (HOSPITAL_BASED_OUTPATIENT_CLINIC_OR_DEPARTMENT_OTHER): Payer: Managed Care, Other (non HMO) | Admitting: Family

## 2012-06-20 ENCOUNTER — Other Ambulatory Visit (HOSPITAL_BASED_OUTPATIENT_CLINIC_OR_DEPARTMENT_OTHER): Payer: Managed Care, Other (non HMO) | Admitting: Lab

## 2012-06-20 VITALS — BP 136/65 | HR 68 | Temp 97.1°F | Resp 20 | Ht 62.5 in | Wt 127.6 lb

## 2012-06-20 DIAGNOSIS — M810 Age-related osteoporosis without current pathological fracture: Secondary | ICD-10-CM

## 2012-06-20 DIAGNOSIS — D472 Monoclonal gammopathy: Secondary | ICD-10-CM

## 2012-06-20 DIAGNOSIS — D509 Iron deficiency anemia, unspecified: Secondary | ICD-10-CM

## 2012-06-20 DIAGNOSIS — D72819 Decreased white blood cell count, unspecified: Secondary | ICD-10-CM

## 2012-06-20 LAB — CBC WITH DIFFERENTIAL/PLATELET
BASO%: 1.7 % (ref 0.0–2.0)
HCT: 35 % (ref 34.8–46.6)
LYMPH%: 51.3 % — ABNORMAL HIGH (ref 14.0–49.7)
MCHC: 33.8 g/dL (ref 31.5–36.0)
MCV: 92.7 fL (ref 79.5–101.0)
MONO#: 0.4 10*3/uL (ref 0.1–0.9)
MONO%: 9.6 % (ref 0.0–14.0)
NEUT%: 33.5 % — ABNORMAL LOW (ref 38.4–76.8)
Platelets: 318 10*3/uL (ref 145–400)
RBC: 3.77 10*6/uL (ref 3.70–5.45)
WBC: 4 10*3/uL (ref 3.9–10.3)

## 2012-06-20 NOTE — Telephone Encounter (Signed)
appts made and printed for pt aom °

## 2012-06-20 NOTE — Patient Instructions (Addendum)
Patient ID: Catherine Bullock,   DOB: 1946-09-19,  MRN: [id number]   [facility] Discharge Instructions  RECOMMENDATIONS MAD BY THE CONSULTANT AND ANY TEST RESULT(S) WILL BE FORWARDED TO YOU REFERRING DOCTOR   EXAM FINDINGS BY NURSE PRACTITIONER TODAY TO REPORT TO THE CLINIC OR PRIMARY PROVIDER: N/A   Your Current Medications Are: Current Outpatient Prescriptions  Medication Sig Dispense Refill  . Calcium Carbonate-Vitamin D (CALTRATE 600+D PO) Take by mouth 2 (two) times daily.      . cholecalciferol (VITAMIN D) 1000 UNITS tablet Take 1,000 Units by mouth daily.      Marland Kitchen escitalopram (LEXAPRO) 20 MG tablet Take 20 mg by mouth daily.      . ferrous sulfate 325 (65 FE) MG tablet Take 325 mg by mouth daily with breakfast.      . Multiple Vitamin (MULTIVITAMIN) tablet Take 1 tablet by mouth daily.      Marland Kitchen olmesartan (BENICAR) 20 MG tablet Take 20 mg by mouth daily.      . phenytoin (DILANTIN) 100 MG ER capsule Take by mouth 2 (two) times daily. 2 capsules in the am and 1 capsule in the pm         INSTRUCTIONS GIVEN, DISCUSSED AND FOLLOW-UP: Colace (twice daily), Miralax (as needed), prunes (daily) should improve your constipation  I acknowledge that I have been informed and understand all the instructions given to me and have received a copy.  I do not have any further questions at this time, but I understand that I may call the [facility] at [phone] during business hours should I have any further questions or need assistance in obtaining follow-up care.   06/20/2012, 9:15 AM

## 2012-06-20 NOTE — Progress Notes (Signed)
Patient ID: Catherine Bullock, female   DOB: 1946-06-22, 66 y.o.   MRN: [id number] CSN: [id number]  CC: Dario Guardian, M.D.  Problem List: Catherine Bullock is a 66 y.o. Caucasian female with a problem list consisting of:  1. Mild leukopenia and neutropenia dating back, at least, to early 2011.  2. History of the iron-deficiency anemia.  3. History of thrombocytosis dating back to 1999, JAK2 mutation negative.  4. IgG lambda monoclonal gammopathy. The bone marrow on 11/04/2009 showed 13% plasma cells. Metastatic bone survey in January 2011 was negative.  5. Hypertension.  6. History of epilepsy. The patient has not had a seizure since 1987.  7. Degenerative disk disease with herniated disk at L5-S1.  8. Osteoporosis.  9. History of mild hyponatremia.  10.Tubular adenoma of the colon on 12/06/2008.  11.History of anxiety.  Catherine Bullock is here today with her husband.  She was last seen by [place] on 12/14/2011. She continues to do well with no significant problems. She has had no infections or fever. No significant medical changes and she is without significan complaint today.  The only complaint that the patient stated today was that of occasional constipation.  We discussed a high fiber diet, prunes, taking Colace BID and using Miralax as needed.  The patient continues to do well and denies any unusual bruising, bleeding, fatigue or any other symptomatology or concerns.  The patient is scheduled for a mammogram in October, 2013,  GYN visit in November, 2013 and she is not due for another colonoscopy until March, 2015.  The patient declines the influenza vaccination annually.   Past Medical History: Past Medical History  Diagnosis Date  . Iron deficiency anemia   . Thrombocytosis   . Hypertension   . Epilepsy   . MVP (mitral valve prolapse)   . Anxiety   . Osteoporosis   . Degenerative disk disease   . IgG monoclonal gammopathy   . Colon adenoma     Surgical History: No past surgical history  on file.  Current Medications: Current Outpatient Prescriptions  Medication Sig Dispense Refill  . Calcium Carbonate-Vitamin D (CALTRATE 600+D PO) Take by mouth 2 (two) times daily.      . cholecalciferol (VITAMIN D) 1000 UNITS tablet Take 1,000 Units by mouth daily.      Marland Kitchen escitalopram (LEXAPRO) 20 MG tablet Take 20 mg by mouth daily.      . ferrous sulfate 325 (65 FE) MG tablet Take 325 mg by mouth daily with breakfast.      . Multiple Vitamin (MULTIVITAMIN) tablet Take 1 tablet by mouth daily.      Marland Kitchen olmesartan (BENICAR) 20 MG tablet Take 20 mg by mouth daily.      . phenytoin (DILANTIN) 100 MG ER capsule Take by mouth 2 (two) times daily. 2 capsules in the am and 1 capsule in the pm        Allergies: No Known Allergies   Family History: Family History  Problem Relation Age of Onset  . Nephrolithiasis Mother   . Alzheimer's disease Mother   . Ulcers Mother   . Prostate cancer Father   . Multiple myeloma Father     Social History: History  Substance Use Topics  . Smoking status: Former Smoker    Quit date: 06/21/1979  . Smokeless tobacco: Not on file  . Alcohol Use: 1.2 oz/week    2 Glasses of wine per week    Review of Systems: 10 Point review of systems  was completed and is negative except as noted above.   Physical Exam:   Blood pressure 136/65, pulse 68, temperature 97.1 F (36.2 C), temperature source Oral, resp. rate 20, height 5' 2.5" (1.588 m), weight 127 lb 9.6 oz (57.879 kg).  General appearance: Alert, cooperative, well nourished, well developed, no apparent distress Head: Normocephalic, without obvious abnormality, atraumatic Eyes: Conjunctivae/corneas are clear, PERRLA, EOMI Nose: Nares, septum and mucosa are normal, no drainage or sinus tenderness Throat: Lips, mucosa, tongue, teeth and gums are normal Resp: Clear to auscultation bilaterally Cardio: Regular rate and rhythm, S1, S2 normal, 1/6 murmur, no click/ub/gallop GI: soft, non-tender; bowel  sounds normal; no masses,  no organomegaly Extremities: Extremities normal, atraumatic, no cyanosis or edema Lymph nodes: Cervical, supraclavicular, and axillary nodes normal Neurologic: Grossly normal   Laboratory Data: Results for orders placed in visit on 06/20/12 (from the past 48 hour(s))  CBC WITH DIFFERENTIAL     Status: Abnormal   Collection Time   06/20/12  8:33 AM      Component Value Range Comment   WBC 4.0  3.9 - 10.3 10e3/uL    NEUT# 1.3 (*) 1.5 - 6.5 10e3/uL    HGB 11.8  11.6 - 15.9 g/dL    HCT 16.1  09.6 - 04.5 %    Platelets 318  145 - 400 10e3/uL    MCV 92.7  79.5 - 101.0 fL    MCH 31.3  25.1 - 34.0 pg    MCHC 33.8  31.5 - 36.0 g/dL    RBC 4.09  8.11 - 9.14 10e6/uL    RDW 14.1  11.2 - 14.5 %    lymph# 2.0  0.9 - 3.3 10e3/uL    MONO# 0.4  0.1 - 0.9 10e3/uL    Eosinophils Absolute 0.2  0.0 - 0.5 10e3/uL    Basophils Absolute 0.1  0.0 - 0.1 10e3/uL    NEUT% 33.5 (*) 38.4 - 76.8 %    LYMPH% 51.3 (*) 14.0 - 49.7 %    MONO% 9.6  0.0 - 14.0 %    EOS% 3.9  0.0 - 7.0 %    BASO% 1.7  0.0 - 2.0 %      Imaging Studies: 1. Metastatic bone survey on 11/18/2009 was negative.  2. Bilateral screening mammograms from 08/20/2011 showed a possible mass in right breast.  Additional evaluation was indicated.  3. Digital diagnostic right limited mammogram with CAD on 09/07/2011 showed no persistent worrisome abnormality upon additional imaging of the right breast.  4. The patient had a bone density scan in the office of Dr. Marcelle Overlie. We will request the bone density records.   Impression/Plan: I have asked Catherine Bullock to return in 6 months at which time we will check CBC, chemistries, LDH,  iron studies, and quantitative immunoglobulins.   Mithcell Schumpert  NP-C 06/20/2012, 9:19 AM

## 2012-06-21 LAB — COMPREHENSIVE METABOLIC PANEL
ALT: 40 U/L — ABNORMAL HIGH (ref 0–35)
Alkaline Phosphatase: 83 U/L (ref 39–117)
CO2: 25 mEq/L (ref 19–32)
Creatinine, Ser: 0.94 mg/dL (ref 0.50–1.10)
Glucose, Bld: 98 mg/dL (ref 70–99)
Sodium: 138 mEq/L (ref 135–145)
Total Bilirubin: 0.3 mg/dL (ref 0.3–1.2)

## 2012-06-21 LAB — IRON AND TIBC
%SAT: 60 % — ABNORMAL HIGH (ref 20–55)
TIBC: 269 ug/dL (ref 250–470)
UIBC: 108 ug/dL — ABNORMAL LOW (ref 125–400)

## 2012-06-21 LAB — FERRITIN: Ferritin: 201 ng/mL (ref 10–291)

## 2012-06-21 LAB — LACTATE DEHYDROGENASE: LDH: 138 U/L (ref 94–250)

## 2012-08-08 ENCOUNTER — Other Ambulatory Visit: Payer: Self-pay | Admitting: Obstetrics and Gynecology

## 2012-08-08 DIAGNOSIS — Z1231 Encounter for screening mammogram for malignant neoplasm of breast: Secondary | ICD-10-CM

## 2012-08-24 ENCOUNTER — Ambulatory Visit: Payer: Managed Care, Other (non HMO)

## 2012-08-30 ENCOUNTER — Ambulatory Visit
Admission: RE | Admit: 2012-08-30 | Discharge: 2012-08-30 | Disposition: A | Discharge status: Home / Self Care | Payer: Managed Care, Other (non HMO) | Source: Ambulatory Visit | Attending: Obstetrics and Gynecology | Admitting: Obstetrics and Gynecology

## 2012-08-30 DIAGNOSIS — Z1231 Encounter for screening mammogram for malignant neoplasm of breast: Secondary | ICD-10-CM

## 2012-11-20 ENCOUNTER — Telehealth: Payer: Self-pay | Admitting: Oncology

## 2012-11-20 NOTE — Telephone Encounter (Signed)
called and l/m with new appt time(for chemo pt)      anne

## 2012-12-11 ENCOUNTER — Other Ambulatory Visit: Payer: Managed Care, Other (non HMO) | Admitting: Lab

## 2012-12-11 ENCOUNTER — Ambulatory Visit: Payer: Managed Care, Other (non HMO) | Admitting: Oncology

## 2012-12-25 ENCOUNTER — Ambulatory Visit (HOSPITAL_BASED_OUTPATIENT_CLINIC_OR_DEPARTMENT_OTHER): Payer: Managed Care, Other (non HMO) | Admitting: Oncology

## 2012-12-25 ENCOUNTER — Other Ambulatory Visit (HOSPITAL_BASED_OUTPATIENT_CLINIC_OR_DEPARTMENT_OTHER): Payer: Managed Care, Other (non HMO)

## 2012-12-25 ENCOUNTER — Telehealth: Payer: Self-pay | Admitting: Oncology

## 2012-12-25 ENCOUNTER — Encounter: Payer: Self-pay | Admitting: Oncology

## 2012-12-25 VITALS — BP 144/66 | HR 60 | Temp 97.0°F | Resp 18 | Wt 123.1 lb

## 2012-12-25 DIAGNOSIS — D509 Iron deficiency anemia, unspecified: Secondary | ICD-10-CM

## 2012-12-25 DIAGNOSIS — D72819 Decreased white blood cell count, unspecified: Secondary | ICD-10-CM

## 2012-12-25 LAB — CBC WITH DIFFERENTIAL/PLATELET
Basophils Absolute: 0 10*3/uL (ref 0.0–0.1)
EOS%: 1 % (ref 0.0–7.0)
HCT: 35.2 % (ref 34.8–46.6)
HGB: 12.1 g/dL (ref 11.6–15.9)
LYMPH%: 45 % (ref 14.0–49.7)
MCH: 31.5 pg (ref 25.1–34.0)
MCHC: 34.4 g/dL (ref 31.5–36.0)
MCV: 91.5 fL (ref 79.5–101.0)
MONO%: 9.4 % (ref 0.0–14.0)
NEUT%: 43.6 % (ref 38.4–76.8)

## 2012-12-25 LAB — IGG, IGA, IGM
IgA: 91 mg/dL (ref 69–380)
IgG (Immunoglobin G), Serum: 655 mg/dL — ABNORMAL LOW (ref 690–1700)
IgM, Serum: 34 mg/dL — ABNORMAL LOW (ref 52–322)

## 2012-12-25 LAB — COMPREHENSIVE METABOLIC PANEL (CC13)
ALT: 42 U/L (ref 0–55)
AST: 25 U/L (ref 5–34)
Albumin: 3.6 g/dL (ref 3.5–5.0)
Alkaline Phosphatase: 82 U/L (ref 40–150)
BUN: 17.4 mg/dL (ref 7.0–26.0)
Potassium: 4.6 mEq/L (ref 3.5–5.1)
Sodium: 133 mEq/L — ABNORMAL LOW (ref 136–145)

## 2012-12-25 LAB — IRON AND TIBC: TIBC: 296 ug/dL (ref 250–470)

## 2012-12-25 NOTE — Progress Notes (Signed)
This office note has been dictated.  #[id number]

## 2012-12-25 NOTE — Progress Notes (Signed)
CC:   Dario Guardian, M.D.  PROBLEM LIST:  1. Mild leukopenia and neutropenia dating back, at least, to early  2011.  2. History of the iron-deficiency anemia.  3. History of thrombocytosis dating back to 1999, JAK2 mutation  negative.  4. IgG lambda monoclonal gammopathy. The bone marrow on 11/04/2009  showed 13% plasma cells. Metastatic bone survey in January 2011  was negative.  5. Hypertension.  6. History of epilepsy. The patient has not had a seizure since 1987.  7. Degenerative disk disease with herniated disk at L5-S1.  8. Osteoporosis.  9. History of mild hyponatremia.  10.Tubular adenoma of the colon on 12/06/2008.  11.History of anxiety.  MEDICATIONS:  Reviewed and recorded. Current Outpatient Prescriptions  Medication Sig Dispense Refill  . Calcium Carbonate-Vitamin D (CALTRATE 600+D PO) Take by mouth 2 (two) times daily.      . cholecalciferol (VITAMIN D) 1000 UNITS tablet Take 1,000 Units by mouth daily.      Marland Kitchen escitalopram (LEXAPRO) 20 MG tablet Take 20 mg by mouth daily.      . ferrous sulfate 325 (65 FE) MG tablet Take 325 mg by mouth daily with breakfast.      . Multiple Vitamin (MULTIVITAMIN) tablet Take 1 tablet by mouth daily.      Marland Kitchen olmesartan (BENICAR) 20 MG tablet Take 20 mg by mouth daily.      . phenytoin (DILANTIN) 100 MG ER capsule Take by mouth 2 (two) times daily. 2 capsules in the am and 1 capsule in the pm       No current facility-administered medications for this visit.    IMMUNIZATIONS:  None on record.  SMOKING HISTORY:  The patient smoked a half a pack of cigarettes a day for about 20 years.  She stopped smoking in 1980.    HISTORY:  I saw Catherine Bullock today for followup of previous leukopenia, neutropenia, iron deficiency anemia, thrombocytosis and an IgG lambda monoclonal gammopathy associated with a bone marrow from 11/04/2009 showing 13% plasma cells.  Catherine Bullock was last seen by [place] on 06/20/2012 and prior to that on 12/14/2011.   She is accompanied by her husband today.  She is now 67 years old.  The patient is without any complaints, feels generally well.   PHYSICAL EXAM:  Catherine Bullock looks well.  Weight is 123 pounds 1 ounce. Height 5 feet 2-1/2 inches.  Body surface area 1.57 sq m.  Blood pressure 144/66.  Other vital signs are normal.  There was no scleral icterus.  Mouth and pharynx are benign.  There was no peripheral adenopathy palpable.  Lungs were clear to percussion and auscultation. Cardiac exam regular rhythm with soft systolic ejection murmur.  Breasts were not examined.  No axillary adenopathy.  Abdomen was soft, nontender, with no organomegaly or masses palpable.  Extremities no peripheral edema or clubbing.  Neurologic exam was normal.  LABORATORY DATA:  Today, white count 4.5, ANC 1.9, hemoglobin 12.1, hematocrit 35.2, platelets 310,000.  Chemistries today are pending. Chemistries from 06/20/2012 were notable for an ALT of 40 with normal being 0-35, otherwise normal.  Iron studies and quantitative immunoglobulins are pending.  Ferritin was 201 and iron saturation 60% on 06/20/2012.  On 06/20/2012 IgG level was 622 which is low, IgA 92 which is normal and IgM 38 which is low.  IMAGING STUDIES:  1. Metastatic bone survey on 11/18/2009 was negative.  2. Bilateral screening mammograms from 08/20/2011 showed a possible  mass in right breast. Additional  evaluation was indicated.  3. Digital diagnostic right limited mammogram with CAD on 09/07/2011  showed no persistent worrisome abnormality upon additional imaging  of the right breast.  4. The patient had a bone density scan in the office of Dr. Marcelle Overlie.  The patient will try to obtain that report for [place]. 5. Digital bilateral screening mammogram and digital breast tomosynthesis carried out on 08/30/2012 showed no mammographic evidence of malignancy.    IMPRESSION AND PLAN:  Catherine Bullock continues to do well.  Labs have been stable.  The patient  apparently will be due for another colonoscopy in March 2015.  Her last colonoscopy was on 12/06/2008.  She had a tubular adenoma.  Catherine Bullock will return in 6 months, at which time we will again check CBC, chemistries, iron studies and quantitative immunoglobulins.    ______________________________ Samul Dada, M.D. DSM/MEDQ  D:  12/25/2012  T:  12/25/2012  Job:  [id number]

## 2012-12-25 NOTE — Telephone Encounter (Signed)
Gave pt appt for August 2014 lab and MD °

## 2013-06-19 ENCOUNTER — Telehealth: Payer: Self-pay | Admitting: Hematology and Oncology

## 2013-06-19 NOTE — Telephone Encounter (Signed)
, °

## 2013-06-25 ENCOUNTER — Ambulatory Visit: Payer: Managed Care, Other (non HMO)

## 2013-06-25 ENCOUNTER — Other Ambulatory Visit: Payer: Managed Care, Other (non HMO) | Admitting: Lab

## 2013-07-05 ENCOUNTER — Telehealth: Payer: Self-pay | Admitting: Internal Medicine

## 2013-07-05 NOTE — Telephone Encounter (Signed)
Moved 9/10 appt for lb/CP2 to 10/13 lb/CP. S/w pt she is aware.

## 2013-07-11 ENCOUNTER — Ambulatory Visit: Payer: Managed Care, Other (non HMO)

## 2013-07-11 ENCOUNTER — Other Ambulatory Visit: Payer: Managed Care, Other (non HMO) | Admitting: Lab

## 2013-07-31 ENCOUNTER — Other Ambulatory Visit: Payer: Self-pay

## 2013-07-31 DIAGNOSIS — Z1231 Encounter for screening mammogram for malignant neoplasm of breast: Secondary | ICD-10-CM

## 2013-08-12 ENCOUNTER — Other Ambulatory Visit: Payer: Self-pay | Admitting: Internal Medicine

## 2013-08-12 DIAGNOSIS — D472 Monoclonal gammopathy: Secondary | ICD-10-CM

## 2013-08-12 DIAGNOSIS — D72819 Decreased white blood cell count, unspecified: Secondary | ICD-10-CM

## 2013-08-12 DIAGNOSIS — D509 Iron deficiency anemia, unspecified: Secondary | ICD-10-CM

## 2013-08-13 ENCOUNTER — Telehealth: Payer: Self-pay | Admitting: Internal Medicine

## 2013-08-13 ENCOUNTER — Other Ambulatory Visit (HOSPITAL_BASED_OUTPATIENT_CLINIC_OR_DEPARTMENT_OTHER): Payer: Managed Care, Other (non HMO) | Admitting: Lab

## 2013-08-13 ENCOUNTER — Encounter (INDEPENDENT_AMBULATORY_CARE_PROVIDER_SITE_OTHER): Payer: Self-pay

## 2013-08-13 ENCOUNTER — Telehealth: Payer: Self-pay | Admitting: Hematology and Oncology

## 2013-08-13 ENCOUNTER — Ambulatory Visit (HOSPITAL_BASED_OUTPATIENT_CLINIC_OR_DEPARTMENT_OTHER): Payer: Managed Care, Other (non HMO) | Admitting: Internal Medicine

## 2013-08-13 VITALS — BP 139/59 | HR 63 | Temp 97.9°F | Resp 20 | Ht 62.5 in | Wt 119.9 lb

## 2013-08-13 DIAGNOSIS — D72819 Decreased white blood cell count, unspecified: Secondary | ICD-10-CM

## 2013-08-13 DIAGNOSIS — D509 Iron deficiency anemia, unspecified: Secondary | ICD-10-CM

## 2013-08-13 DIAGNOSIS — D472 Monoclonal gammopathy: Secondary | ICD-10-CM

## 2013-08-13 LAB — CBC WITH DIFFERENTIAL/PLATELET
Basophils Absolute: 0 10*3/uL (ref 0.0–0.1)
Eosinophils Absolute: 0.1 10*3/uL (ref 0.0–0.5)
LYMPH%: 46.3 % (ref 14.0–49.7)
MCV: 88.7 fL (ref 79.5–101.0)
MONO%: 13.1 % (ref 0.0–14.0)
NEUT#: 1.3 10*3/uL — ABNORMAL LOW (ref 1.5–6.5)
Platelets: 327 10*3/uL (ref 145–400)
RBC: 3.9 10*6/uL (ref 3.70–5.45)

## 2013-08-13 LAB — IRON AND TIBC CHCC
%SAT: 71 % — ABNORMAL HIGH (ref 21–57)
Iron: 183 ug/dL — ABNORMAL HIGH (ref 41–142)
UIBC: 77 ug/dL — ABNORMAL LOW (ref 120–384)

## 2013-08-13 LAB — COMPREHENSIVE METABOLIC PANEL (CC13)
Anion Gap: 8 mEq/L (ref 3–11)
CO2: 25 mEq/L (ref 22–29)
Creatinine: 0.8 mg/dL (ref 0.6–1.1)
Glucose: 114 mg/dl (ref 70–140)
Total Bilirubin: 0.27 mg/dL (ref 0.20–1.20)

## 2013-08-13 LAB — FERRITIN CHCC: Ferritin: 178 ng/ml (ref 9–269)

## 2013-08-13 NOTE — Telephone Encounter (Signed)
Opened in error - dr Rosie Fate pt.

## 2013-08-13 NOTE — Progress Notes (Signed)
[facility] OFFICE PROGRESS NOTE  Catherine Found, MD [phone] [address], [address] [place] [address]  DIAGNOSIS: MGUS (monoclonal gammopathy of unknown significance) - Plan: CBC with Differential, Comprehensive metabolic panel, Protein / creatinine ratio, urine, Kappa/lambda light chains, IgG, IgA, IgM  Leukopenia - Plan: CBC with Differential, Comprehensive metabolic panel, Protein / creatinine ratio, urine, Kappa/lambda light chains, IgG, IgA, IgM  Anemia, iron deficiency - Plan: CBC with Differential, Comprehensive metabolic panel, Protein / creatinine ratio, urine, Kappa/lambda light chains, IgG, IgA, IgM  Chief Complaint  Patient presents with  . Leukopenia  . MGUS  . Iron deficiency anemia    CURRENT THERAPY: Observation; ferrous sulfate 325 mg daily.  INTERVAL HISTORY: Catherine Bullock 67 y.o. female with a history of IgG G. lambda monoclonal gammopathy of undetermined significance, mild leukopenia/neutropenia, and a history of iron deficiency anemia presents for followup. She was last seen by Dr. Arline Bullock on 12/25/2012.  Today, she is accompanied by her husband Catherine Bullock. She reports no recent hospitalizations or emergency room visits. Also she reports that she is still feeling overall very well. He continues to take one iron sulfate tablet daily. She awaits repeat colonoscopy in March 2015. Her last colonoscopy in February it fifth 2003 and revealed a tubular adenoma of the colon. He is also scheduled for mammogram in November of this year her last on 08/30/2012 Catherine Bullock no mammographic evidence of malignancy. She reports that her weight is stable with a good appetite. She also denies fever, chills, acute shortness of breath.  She denies bone pain or palpitations or dyspnea on exertion.  MEDICAL HISTORY: Past Medical History  Diagnosis Date  . Iron deficiency anemia   . Thrombocytosis   . Hypertension   . Epilepsy   . MVP (mitral valve prolapse)    . Anxiety   . Osteoporosis   . Degenerative disk disease   . IgG monoclonal gammopathy   . Colon adenoma     INTERIM HISTORY: has Leukopenia; MGUS (monoclonal gammopathy of unknown significance); and Anemia, iron deficiency on her problem list.    ALLERGIES:  has No Known Allergies.  MEDICATIONS: has a current medication list which includes the following prescription(s): calcium carbonate-vitamin d, cholecalciferol, escitalopram, ferrous sulfate, fish oil-omega-3 fatty acids, multivitamin, olmesartan, phenytoin, and probiotic product.  SURGICAL HISTORY: No past surgical history on file.  REVIEW OF SYSTEMS:   Constitutional: Denies fevers, chills or abnormal weight loss Eyes: Denies blurriness of vision Ears, nose, mouth, throat, and face: Denies mucositis or sore throat Respiratory: Denies cough, dyspnea or wheezes Cardiovascular: Denies palpitation, chest discomfort or lower extremity swelling Gastrointestinal:  Denies nausea, heartburn or change in bowel habits Skin: Denies abnormal skin rashes Lymphatics: Denies new lymphadenopathy or easy bruising Neurological:Denies numbness, tingling or new weaknesses Behavioral/Psych: Mood is stable, no new changes  All other systems were reviewed with the patient and are negative.  PHYSICAL EXAMINATION: ECOG PERFORMANCE STATUS: 0 - Asymptomatic  Blood pressure 139/59, pulse 63, temperature 97.9 F (36.6 C), temperature source Oral, resp. rate 20, height 5' 2.5" (1.588 m), weight 119 lb 14.4 oz (54.386 kg).  GENERAL:alert, no distress and comfortable SKIN: skin color, texture, turgor are normal, no rashes or significant lesions EYES: normal, Conjunctiva are pink and non-injected, sclera clear OROPHARYNX:no exudate, no erythema and lips, buccal mucosa, and tongue normal  NECK: supple, thyroid normal size, non-tender, without nodularity LYMPH:  no palpable lymphadenopathy in the cervical, axillary or supraclavicular LUNGS: clear to  auscultation and  percussion with normal breathing effort HEART: regular rate & rhythm and no murmurs and no lower extremity edema ABDOMEN:abdomen soft, non-tender and normal bowel sounds Musculoskeletal:no cyanosis of digits and no clubbing; arthritic changes of the hands bilaterally. NEURO: alert & oriented x 3 with fluent speech, no focal motor/sensory deficits   LABORATORY DATA: Results for orders placed in visit on 08/13/13 (from the past 48 hour(s))  CBC WITH DIFFERENTIAL     Status: Abnormal   Collection Time    08/13/13 10:46 AM      Result Value Range   WBC 3.5 (*) 3.9 - 10.3 10e3/uL   NEUT# 1.3 (*) 1.5 - 6.5 10e3/uL   HGB 12.0  11.6 - 15.9 g/dL   HCT 16.1 (*) 09.6 - 04.5 %   Platelets 327  145 - 400 10e3/uL   MCV 88.7  79.5 - 101.0 fL   MCH 30.8  25.1 - 34.0 pg   MCHC 34.7  31.5 - 36.0 g/dL   RBC 4.09  8.11 - 9.14 10e6/uL   RDW 13.7  11.2 - 14.5 %   lymph# 1.6  0.9 - 3.3 10e3/uL   MONO# 0.5  0.1 - 0.9 10e3/uL   Eosinophils Absolute 0.1  0.0 - 0.5 10e3/uL   Basophils Absolute 0.0  0.0 - 0.1 10e3/uL   NEUT% 38.3 (*) 38.4 - 76.8 %   LYMPH% 46.3  14.0 - 49.7 %   MONO% 13.1  0.0 - 14.0 %   EOS% 1.4  0.0 - 7.0 %   BASO% 0.9  0.0 - 2.0 %   nRBC 0  0 - 0 %  IRON AND TIBC [facility]     Status: Abnormal   Collection Time    08/13/13 10:46 AM      Result Value Range   Iron 183 (*) 41 - 142 ug/dL   TIBC 782  956 - 213 ug/dL   UIBC 77 (*) 086 - 578 ug/dL   %SAT 71 (*) 21 - 57 %  FERRITIN [facility]     Status: None   Collection Time    08/13/13 10:46 AM      Result Value Range   Ferritin 178  9 - 269 ng/ml  COMPREHENSIVE METABOLIC PANEL ([facility]13)     Status: Abnormal   Collection Time    08/13/13 10:46 AM      Result Value Range   Sodium 132 (*) 136 - 145 mEq/L   Potassium 4.7  3.5 - 5.1 mEq/L   Chloride 100  98 - 109 mEq/L   CO2 25  22 - 29 mEq/L   Glucose 114  70 - 140 mg/dl   BUN 46.9  7.0 - 62.9 mg/dL   Creatinine 0.8  0.6 - 1.1 mg/dL   Total Bilirubin 5.28  0.20 - 1.20  mg/dL   Alkaline Phosphatase 99  40 - 150 U/L   AST 30  5 - 34 U/L   ALT 50  0 - 55 U/L   Total Protein 6.5  6.4 - 8.3 g/dL   Albumin 3.5  3.5 - 5.0 g/dL   Calcium 8.8  8.4 - 41.3 mg/dL   Anion Gap 8  3 - 11 mEq/L    Labs:  Lab Results  Component Value Date   WBC 3.5* 08/13/2013   HGB 12.0 08/13/2013   HCT 34.6* 08/13/2013   MCV 88.7 08/13/2013   PLT 327 08/13/2013   NEUTROABS 1.3* 08/13/2013      Chemistry  Component Value Date/Time   NA 132* 08/13/2013 1046   NA 138 06/20/2012 0833   K 4.7 08/13/2013 1046   K 4.2 06/20/2012 0833   CL 99 12/25/2012 1323   CL 104 06/20/2012 0833   CO2 25 08/13/2013 1046   CO2 25 06/20/2012 0833   BUN 18.7 08/13/2013 1046   BUN 20 06/20/2012 0833   CREATININE 0.8 08/13/2013 1046   CREATININE 0.94 06/20/2012 0833   CREATININE 1.19 10/13/2009 0850      Component Value Date/Time   CALCIUM 8.8 08/13/2013 1046   CALCIUM 8.7 06/20/2012 0833   ALKPHOS 99 08/13/2013 1046   ALKPHOS 83 06/20/2012 0833   AST 30 08/13/2013 1046   AST 25 06/20/2012 0833   ALT 50 08/13/2013 1046   ALT 40* 06/20/2012 0833   BILITOT 0.27 08/13/2013 1046   BILITOT 0.3 06/20/2012 0833      Basic Metabolic Panel:  Recent Labs Lab 08/13/13 1046  NA 132*  K 4.7  CO2 25  GLUCOSE 114  BUN 18.7  CREATININE 0.8  CALCIUM 8.8   GFR Estimated Creatinine Clearance: 57.5 ml/min (by C-G formula based on Cr of 0.8). Liver Function Tests:  Recent Labs Lab 08/13/13 1046  AST 30  ALT 50  ALKPHOS 99  BILITOT 0.27  PROT 6.5  ALBUMIN 3.5   No results Bullock for this basename: LIPASE, AMYLASE,  in the last 168 hours No results Bullock for this basename: AMMONIA,  in the last 168 hours Coagulation profile No results Bullock for this basename: INR, PROTIME,  in the last 168 hours  CBC:  Recent Labs Lab 08/13/13 1046  WBC 3.5*  NEUTROABS 1.3*  HGB 12.0  HCT 34.6*  MCV 88.7  PLT 327   Anemia work up  Recent Labs  08/13/13 1046  FERRITIN 178  TIBC 260   IRON 183*   Results for KATELAND, LEISINGER (MRN [id number]) as of 08/13/2013 14:31  Ref. Range 06/20/2012 08:33 08/30/2012 08:20 12/25/2012 13:23  IgG (Immunoglobin G), Serum Latest Range: [phone] mg/dL 782 (L)  956 (L)  IgA Latest Range: 69-380 mg/dL 92  91  IgM, Serum Latest Range: 52-322 mg/dL 38 (L)  34 (L)    RADIOGRAPHIC STUDIES: No results Bullock.  ASSESSMENT: Catherine Bullock 67 y.o. female with a history of MGUS (monoclonal gammopathy of unknown significance) - Plan: CBC with Differential, Comprehensive metabolic panel, Protein / creatinine ratio, urine, Kappa/lambda light chains, IgG, IgA, IgM  Leukopenia - Plan: CBC with Differential, Comprehensive metabolic panel, Protein / creatinine ratio, urine, Kappa/lambda light chains, IgG, IgA, IgM  Anemia, iron deficiency - Plan: CBC with Differential, Comprehensive metabolic panel, Protein / creatinine ratio, urine, Kappa/lambda light chains, IgG, IgA, IgM   PLAN:  1. IgG lambda MGUS. --We reviewed with the patient extensively what the diagnosis of MGUS involved including the risks of progression to multiple myeloma at 1% per year. This also includes the workup and the current treatment recommendation is observation. As noted above her metastatic bone survey in January 2011 was negative. Her bone marrow biopsy on 11/04/2009 showed 13% plasma cells. Her IgG levels on 12/25/2012 was 655 compared to her IgG levels on 06/20/2012 of 622. We will await repeat protein electrophoresis measurements. Patient can requests continued surveillance every 6 months. We will repeat her CBC, CMP, immunoglobulins (quantitative) in 6 months.   2. History of iron deficiency anemia --Today she denies any symptoms of anemia. He will continue to take ferrous sulfate 325 mg daily. Her ferritin  level is 178, iron of 183, and TIBC of 260. We'll repeat her iron studies on her next visit.  3. History of Thrombocytosis (JAK2 negative). --Today her platelets reveal a count  of 327,000 which is within normal limits. We will continue close observation with treatment of #2 which is likely to be a factor in her elevated platelet count.  4. History of Leukopenia (early 2011). --Continues to have mild leukopenia with a white blood cell count of 3.5 and a absolute neutrophil count of 1.3. She was counseled on if she should have recurrent infections or fevers greater than 100.5 that she needs to call the M.D. on call. She denied any history of dysuria, cough, fever or chills.  5. History of tubular adenoma of the colon (12/06/2008) --She is scheduled for repeat colonoscopy in March 2015.  All questions were answered. The patient knows to call the clinic with any problems, questions or concerns. We can certainly see the patient much sooner if necessary. Patient was provided after visit summary  I spent 15 minutes counseling the patient face to face. The total time spent in the appointment was 25 minutes.    Whitfield Dulay, MD 08/13/2013 2:17 PM

## 2013-08-13 NOTE — Telephone Encounter (Signed)
gv pt appt schedule for April 2015.  °

## 2013-08-14 LAB — IGG, IGA, IGM
IgA: 79 mg/dL (ref 69–380)
IgG (Immunoglobin G), Serum: 541 mg/dL — ABNORMAL LOW (ref 690–1700)

## 2013-09-04 ENCOUNTER — Ambulatory Visit: Payer: Managed Care, Other (non HMO)

## 2013-09-21 ENCOUNTER — Ambulatory Visit
Admission: RE | Admit: 2013-09-21 | Discharge: 2013-09-21 | Disposition: A | Discharge status: Home / Self Care | Payer: Medicare Other | Source: Ambulatory Visit

## 2013-09-21 DIAGNOSIS — Z1231 Encounter for screening mammogram for malignant neoplasm of breast: Secondary | ICD-10-CM | POA: Diagnosis not present

## 2013-10-08 DIAGNOSIS — E7800 Pure hypercholesterolemia, unspecified: Secondary | ICD-10-CM | POA: Diagnosis not present

## 2013-10-08 DIAGNOSIS — M899 Disorder of bone, unspecified: Secondary | ICD-10-CM | POA: Diagnosis not present

## 2013-10-08 DIAGNOSIS — I10 Essential (primary) hypertension: Secondary | ICD-10-CM | POA: Diagnosis not present

## 2013-10-08 DIAGNOSIS — R569 Unspecified convulsions: Secondary | ICD-10-CM | POA: Diagnosis not present

## 2013-10-08 DIAGNOSIS — D473 Essential (hemorrhagic) thrombocythemia: Secondary | ICD-10-CM | POA: Diagnosis not present

## 2013-10-08 DIAGNOSIS — F411 Generalized anxiety disorder: Secondary | ICD-10-CM | POA: Diagnosis not present

## 2013-10-08 DIAGNOSIS — Z1331 Encounter for screening for depression: Secondary | ICD-10-CM | POA: Diagnosis not present

## 2013-11-07 ENCOUNTER — Encounter: Payer: Self-pay | Admitting: Internal Medicine

## 2013-11-09 DIAGNOSIS — Z124 Encounter for screening for malignant neoplasm of cervix: Secondary | ICD-10-CM | POA: Diagnosis not present

## 2013-11-09 DIAGNOSIS — Z130 Encounter for screening for diseases of the blood and blood-forming organs and certain disorders involving the immune mechanism: Secondary | ICD-10-CM | POA: Diagnosis not present

## 2013-11-09 DIAGNOSIS — M810 Age-related osteoporosis without current pathological fracture: Secondary | ICD-10-CM | POA: Diagnosis not present

## 2013-12-13 DIAGNOSIS — D239 Other benign neoplasm of skin, unspecified: Secondary | ICD-10-CM | POA: Diagnosis not present

## 2013-12-13 DIAGNOSIS — L821 Other seborrheic keratosis: Secondary | ICD-10-CM | POA: Diagnosis not present

## 2013-12-13 DIAGNOSIS — L819 Disorder of pigmentation, unspecified: Secondary | ICD-10-CM | POA: Diagnosis not present

## 2013-12-13 DIAGNOSIS — L570 Actinic keratosis: Secondary | ICD-10-CM | POA: Diagnosis not present

## 2014-02-11 ENCOUNTER — Other Ambulatory Visit (HOSPITAL_BASED_OUTPATIENT_CLINIC_OR_DEPARTMENT_OTHER): Payer: Medicare Other

## 2014-02-11 ENCOUNTER — Telehealth: Payer: Self-pay | Admitting: Internal Medicine

## 2014-02-11 ENCOUNTER — Ambulatory Visit (HOSPITAL_BASED_OUTPATIENT_CLINIC_OR_DEPARTMENT_OTHER): Payer: Medicare Other | Admitting: Internal Medicine

## 2014-02-11 VITALS — BP 144/63 | HR 60 | Temp 97.8°F | Resp 18 | Ht 62.0 in | Wt 120.5 lb

## 2014-02-11 DIAGNOSIS — D72819 Decreased white blood cell count, unspecified: Secondary | ICD-10-CM

## 2014-02-11 DIAGNOSIS — D472 Monoclonal gammopathy: Secondary | ICD-10-CM

## 2014-02-11 DIAGNOSIS — D509 Iron deficiency anemia, unspecified: Secondary | ICD-10-CM

## 2014-02-11 DIAGNOSIS — D72829 Elevated white blood cell count, unspecified: Secondary | ICD-10-CM | POA: Diagnosis not present

## 2014-02-11 DIAGNOSIS — D649 Anemia, unspecified: Secondary | ICD-10-CM | POA: Diagnosis not present

## 2014-02-11 LAB — CBC WITH DIFFERENTIAL/PLATELET
BASO%: 1.9 % (ref 0.0–2.0)
Basophils Absolute: 0.1 10*3/uL (ref 0.0–0.1)
EOS%: 1.9 % (ref 0.0–7.0)
Eosinophils Absolute: 0.1 10*3/uL (ref 0.0–0.5)
HEMATOCRIT: 37.2 % (ref 34.8–46.6)
HGB: 12.3 g/dL (ref 11.6–15.9)
LYMPH#: 1.7 10*3/uL (ref 0.9–3.3)
LYMPH%: 43.1 % (ref 14.0–49.7)
MCH: 31 pg (ref 25.1–34.0)
MCHC: 33.1 g/dL (ref 31.5–36.0)
MCV: 93.7 fL (ref 79.5–101.0)
MONO#: 0.5 10*3/uL (ref 0.1–0.9)
MONO%: 11.9 % (ref 0.0–14.0)
NEUT#: 1.6 10*3/uL (ref 1.5–6.5)
NEUT%: 41.2 % (ref 38.4–76.8)
Platelets: 364 10*3/uL (ref 145–400)
RBC: 3.97 10*6/uL (ref 3.70–5.45)
RDW: 14.6 % — ABNORMAL HIGH (ref 11.2–14.5)
WBC: 3.9 10*3/uL (ref 3.9–10.3)

## 2014-02-11 LAB — PROTEIN / CREATININE RATIO, URINE
Creatinine, Urine: 72.6 mg/dL
PROTEIN CREATININE RATIO: 0.11 (ref <0.15)
TOTAL PROTEIN, URINE: 8 mg/dL

## 2014-02-11 LAB — COMPREHENSIVE METABOLIC PANEL (CC13)
ALK PHOS: 98 U/L (ref 40–150)
ALT: 41 U/L (ref 0–55)
AST: 31 U/L (ref 5–34)
Albumin: 3.4 g/dL — ABNORMAL LOW (ref 3.5–5.0)
Anion Gap: 8 mEq/L (ref 3–11)
BILIRUBIN TOTAL: 0.25 mg/dL (ref 0.20–1.20)
BUN: 16.8 mg/dL (ref 7.0–26.0)
CO2: 25 mEq/L (ref 22–29)
CREATININE: 0.9 mg/dL (ref 0.6–1.1)
Calcium: 9 mg/dL (ref 8.4–10.4)
Chloride: 104 mEq/L (ref 98–109)
Glucose: 114 mg/dl (ref 70–140)
Potassium: 4.5 mEq/L (ref 3.5–5.1)
Sodium: 137 mEq/L (ref 136–145)
Total Protein: 6.5 g/dL (ref 6.4–8.3)

## 2014-02-11 NOTE — Patient Instructions (Signed)
  Multiple myeloma is the most common cancer of bone. It is caused by the uncontrolled multiplication of a type of white blood cell in the marrow. This white blood cell is called a plasma cell. This means the bone marrow is overworking producing plasma cells. Soon these overproduced cells begin to take up room in the marrow that is needed by other cells. This means that there are soon not enough red or white blood cells or platelets. Not enough red cells mean that the person is anemic. There are not enough red blood cells to carry oxygen around the body. There are not enough white blood cells to fight disease. This causes the person with multiple myeloma to not feel well. There is also bone pain through much of the body. SYMPTOMS  Anemia causes fatigue (tiredness) and weakness.  Back pain is common. This is from fractures (break in bones) caused by damage to the bones of the back.  Lack of white blood cells makes infection more likely.  Bleeding is a common problem from lack of the cells (platelets). Platelets help blood clots form. This may show up as bleeding from any place. Commonly this shows up as bleeding from the nose or gums.  Fractures (bone breaks) are more common anywhere. The back and ribs are the most commonly fractured areas. DIAGNOSIS  This tumor is often suggested by blood tests. Often doing a bone marrow sample makes the diagnosis (learning what is wrong). This is a test performed by taking a small sample of bone with a small needle. This bone often comes from the sternum (breast bone). This sample is sent to a pathologist (a specialist in looking at tissue under a microscope). After looking at the sample under the microscope, the pathologist is able to make a diagnosis of the problem. X-rays may also show boney changes. TREATMENT   Occasionally, anti-cancer medications may be used with multiple myeloma. Your caregiver can discuss this with you.  Medications can also be given to  help with the bone pain.  There is no cure for multiple myeloma. Lifestyle changes can add years of quality living. HOME CARE INSTRUCTIONS  Often there is no specific treatment for multiple myeloma. Most of the treatment consists of adjustments in dietary and living activities. Some of these changes include:  Your dietitian or caregiver helping you with your dietary questions.  Taking iron and vitamins as prescribed by your caregiver.  Eating a well balanced diet.  Staying active, but follow restrictions suggested by your caregiver. Avoiding heavy lifting (more than 10 pounds) and activities that cause increased pain.  Drinking plenty of water.  Using back braces and a cane may help with some of the boney pain. SEEK IMMEDIATE MEDICAL CARE IF:  You develop severe, uncontrolled boney pain.  You or your family notices confusion, problems with decision-making or inability to stay awake.  You notice increased urination or constipation.  You notice problems holding your water or stool.  You have numbness or loss of control of your extremities (arms/hands or legs/feet). Document Released: 07/13/2001 Document Revised: 01/10/2012 Document Reviewed: 10/13/2008 [facility] Patient Information 2014 [employer].

## 2014-02-11 NOTE — Telephone Encounter (Signed)
[facility] PT APPT SCHEDULE FOR OCT °

## 2014-02-11 NOTE — Progress Notes (Signed)
[place], MD [place], [place] [address]  DIAGNOSIS: Smothering MM/ MGUS (monoclonal gammopathy of unknown significance) - Plan: SPEP & IFE with QIG, CBC with Differential, Comprehensive metabolic panel (Cmet) - [facility], Kappa/lambda light chains  Leukopenia  Anemia, iron deficiency  Chief Complaint  Patient presents with  . MGUS (monoclonal gammopathy of unknown significance)    CURRENT THERAPY: Observation; ferrous sulfate 325 mg daily.  INTERVAL HISTORY: Catherine Bullock 68 y.o. female with a history of IgG lambda monoclonal gammopathy of undetermined significance/ Smothering MM, mild leukopenia/neutropenia, and a history of iron deficiency anemia presents for followup. She was last seen by me on 08/13/2013.  Today, she is accompanied by her husband Mr. [employer]. She reports no recent hospitalizations or emergency room visits. Also she reports that she is still feeling overall very well. He continues to take one iron sulfate tablet daily. Repeat colonoscopy is scheduled for May 2015. Her last colonoscopy in February 2010 and revealed a tubular adenoma of the colon. He is also scheduled for mammogram in November without mammographic evidence of malignancy. Bone density test done with a little bit worst but observing for now.  This is being followed by Dr. Matthew Saras, her Ob/Gyn.  She is scheduled to see Dr. Tamala Julian in May for her bi-annual exam. She reports that her weight is stable with a good appetite. She also denies fever, chills, acute shortness of breath.  She denies bone pain or palpitations or dyspnea on exertion.  She is preparing for a 5 K walk for her church.   MEDICAL HISTORY: Past Medical History  Diagnosis Date  . Iron deficiency anemia   . Thrombocytosis   . Hypertension   . Epilepsy   . MVP (mitral valve prolapse)   . Anxiety   . Osteoporosis   . Degenerative disk disease   . IgG  monoclonal gammopathy   . Colon adenoma     INTERIM HISTORY: has Leukopenia; MGUS (monoclonal gammopathy of unknown significance); and Anemia, iron deficiency on her problem list.    ALLERGIES:  has No Known Allergies.  MEDICATIONS: has a current medication list which includes the following prescription(s): escitalopram, ferrous sulfate, fish oil-omega-3 fatty acids, multivitamin, phenytoin, probiotic product, and valsartan.  SURGICAL HISTORY: No past surgical history on file.  REVIEW OF SYSTEMS:   Constitutional: Denies fevers, chills or abnormal weight loss Eyes: Denies blurriness of vision Ears, nose, mouth, throat, and face: Denies mucositis or sore throat Respiratory: Denies cough, dyspnea or wheezes Cardiovascular: Denies palpitation, chest discomfort or lower extremity swelling Gastrointestinal:  Denies nausea, heartburn or change in bowel habits Skin: Denies abnormal skin rashes Lymphatics: Denies new lymphadenopathy or easy bruising Neurological:Denies numbness, tingling or new weaknesses Behavioral/Psych: Mood is stable, no new changes  All other systems were reviewed with the patient and are negative.  PHYSICAL EXAMINATION: ECOG PERFORMANCE STATUS: 0 - Asymptomatic  Blood pressure 144/63, pulse 60, temperature 97.8 F (36.6 C), temperature source Oral, resp. rate 18, height 5' 2"  (1.575 m), weight 120 lb 8 oz (54.658 kg), SpO2 100.00%.  GENERAL:alert, no distress and comfortable; well developed and well nourished.  SKIN: skin color, texture, turgor are normal, no rashes or significant lesions EYES: normal, Conjunctiva are pink and non-injected, sclera clear OROPHARYNX:no exudate, no erythema and lips, buccal mucosa, and tongue normal  NECK: supple, thyroid normal size, non-tender, without nodularity LYMPH:  no palpable lymphadenopathy in the cervical, axillary or supraclavicular LUNGS: clear  to auscultation and percussion with normal breathing effort HEART: regular  rate & rhythm and no murmurs and no lower extremity edema ABDOMEN:abdomen soft, non-tender and normal bowel sounds Musculoskeletal:no cyanosis of digits and no clubbing; arthritic changes of the hands bilaterally. NEURO: alert & oriented x 3 with fluent speech, no focal motor/sensory deficits  LABORATORY DATA: Results for orders placed in visit on 02/11/14 (from the past 48 hour(s))  CBC WITH DIFFERENTIAL     Status: Abnormal   Collection Time    02/11/14 10:24 AM      Result Value Ref Range   WBC 3.9  3.9 - 10.3 10e3/uL   NEUT# 1.6  1.5 - 6.5 10e3/uL   HGB 12.3  11.6 - 15.9 g/dL   HCT 37.2  34.8 - 46.6 %   Platelets 364  145 - 400 10e3/uL   MCV 93.7  79.5 - 101.0 fL   MCH 31.0  25.1 - 34.0 pg   MCHC 33.1  31.5 - 36.0 g/dL   RBC 3.97  3.70 - 5.45 10e6/uL   RDW 14.6 (*) 11.2 - 14.5 %   lymph# 1.7  0.9 - 3.3 10e3/uL   MONO# 0.5  0.1 - 0.9 10e3/uL   Eosinophils Absolute 0.1  0.0 - 0.5 10e3/uL   Basophils Absolute 0.1  0.0 - 0.1 10e3/uL   NEUT% 41.2  38.4 - 76.8 %   LYMPH% 43.1  14.0 - 49.7 %   MONO% 11.9  0.0 - 14.0 %   EOS% 1.9  0.0 - 7.0 %   BASO% 1.9  0.0 - 2.0 %  COMPREHENSIVE METABOLIC PANEL ([facility])     Status: Abnormal   Collection Time    02/11/14 10:24 AM      Result Value Ref Range   Sodium 137  136 - 145 mEq/L   Potassium 4.5  3.5 - 5.1 mEq/L   Chloride 104  98 - 109 mEq/L   CO2 25  22 - 29 mEq/L   Glucose 114  70 - 140 mg/dl   BUN 16.8  7.0 - 26.0 mg/dL   Creatinine 0.9  0.6 - 1.1 mg/dL   Total Bilirubin 0.25  0.20 - 1.20 mg/dL   Alkaline Phosphatase 98  40 - 150 U/L   AST 31  5 - 34 U/L   ALT 41  0 - 55 U/L   Total Protein 6.5  6.4 - 8.3 g/dL   Albumin 3.4 (*) 3.5 - 5.0 g/dL   Calcium 9.0  8.4 - 10.4 mg/dL   Anion Gap 8  3 - 11 mEq/L    Labs:  Lab Results  Component Value Date   WBC 3.9 02/11/2014   HGB 12.3 02/11/2014   HCT 37.2 02/11/2014   MCV 93.7 02/11/2014   PLT 364 02/11/2014   NEUTROABS 1.6 02/11/2014      Chemistry      Component Value  Date/Time   NA 137 02/11/2014 1024   NA 138 06/20/2012 0833   K 4.5 02/11/2014 1024   K 4.2 06/20/2012 0833   CL 99 12/25/2012 1323   CL 104 06/20/2012 0833   CO2 25 02/11/2014 1024   CO2 25 06/20/2012 0833   BUN 16.8 02/11/2014 1024   BUN 20 06/20/2012 0833   CREATININE 0.9 02/11/2014 1024   CREATININE 0.94 06/20/2012 0833   CREATININE 1.19 10/13/2009 0850      Component Value Date/Time   CALCIUM 9.0 02/11/2014 1024   CALCIUM 8.7 06/20/2012 0833   ALKPHOS 98  02/11/2014 1024   ALKPHOS 83 06/20/2012 0833   AST 31 02/11/2014 1024   AST 25 06/20/2012 0833   ALT 41 02/11/2014 1024   ALT 40* 06/20/2012 0833   BILITOT 0.25 02/11/2014 1024   BILITOT 0.3 06/20/2012 0833     Iron/TIBC/Ferritin    Component Value Date/Time   IRON 183* 08/13/2013 1046   IRON 178* 12/25/2012 1323   TIBC 260 08/13/2013 1046   TIBC 296 12/25/2012 1323   FERRITIN 178 08/13/2013 1046   FERRITIN 193 12/25/2012 1323   CBC:  Recent Labs Lab 02/11/14 1024  WBC 3.9  NEUTROABS 1.6  HGB 12.3  HCT 37.2  MCV 93.7  PLT 364    RADIOGRAPHIC STUDIES: METASTATIC BONE SURVEY 11/18/2009 Comparison: None  Findings: Lateral skull view is negative. There are few small oval  lucencies of the skull which may be due to venous lakes. No  cervical spine lytic lesions. Spondylotic changes are noted.  Minimal disc space narrowing at C5-6. C5-6 and C6-7 osteophytic  formation. Facet joint degenerative changes at multiple levels.  Moderate degenerative spondylotic changes of the T-spine. Small  osteophytes are noted. Lumbar spine is unremarkable. Bony pelvis  and hips unremarkable for bony abnormality as well. Calcification  adjacent to the left greater trochanter may be due to calcific  bursitis. Negative humeri and femurs. Degenerative changes of the  AC joints with osteophytic formation.  IMPRESSION:  Unremarkable metastatic bone survey. See above comments.   ASSESSMENT: Catherine Bullock 68 y.o. female with a history of MGUS  (monoclonal gammopathy of unknown significance) - Plan: SPEP & IFE with QIG, CBC with Differential, Comprehensive metabolic panel (Cmet) - [facility], Kappa/lambda light chains  Leukopenia  Anemia, iron deficiency   PLAN:  1. IgG lambda MGUS / Smothering MM. --We reviewed with the patient extensively what the diagnosis of Smothering MM involved including the risks of progression to multiple myeloma at 5% per year for first 10 years. This also includes the workup and the current treatment recommendation is observation. As noted above her metastatic bone survey in January 2011 was negative. Her bone marrow biopsy on 11/04/2009 showed 13% plasma cells. Her IgG levels on 08/13/2013 was 541, on 12/25/2012 was 655 compared to her IgG levels on 06/20/2012 of 622. We will await repeat protein electrophoresis measurements. Patient can requests continued surveillance every 6 months. We will repeat her CBC, CMP, immunoglobulins (quantitative) in 6 months.   2. History of iron deficiency anemia --Today she denies any symptoms of anemia. He will continue to take ferrous sulfate 325 mg daily.She is scheduled for colonoscopy in May of this year.   3. History of Thrombocytosis (JAK2 negative), resolved. --Today her platelets reveal a count of 364,000 which is within normal limits.   4. History of Leukopenia (early 2011). --Her WBC are 3.9 (up from 3.5) today.  ANC of 1.6 (up from 1.3). This is low normal today.  She was counseled on if she should have recurrent infections or fevers greater than 100.5 that she needs to call the M.D. on call. She denied any history of dysuria, cough, fever or chills.  5. History of tubular adenoma of the colon (12/06/2008) --She is scheduled for repeat colonoscopy in May 2015.  All questions were answered. The patient knows to call the clinic with any problems, questions or concerns. We can certainly see the patient much sooner if necessary. Patient was provided after visit  summary  I spent 15 minutes counseling the patient face to face. The  total time spent in the appointment was 25 minutes.    Concha Norway, MD 02/11/2014 11:20 AM

## 2014-02-12 LAB — KAPPA/LAMBDA LIGHT CHAINS
KAPPA FREE LGHT CHN: 0.58 mg/dL (ref 0.33–1.94)
KAPPA LAMBDA RATIO: 0.01 — AB (ref 0.26–1.65)
LAMBDA FREE LGHT CHN: 99 mg/dL — AB (ref 0.57–2.63)

## 2014-02-12 LAB — IGG, IGA, IGM
IGG (IMMUNOGLOBIN G), SERUM: 577 mg/dL — AB (ref 690–1700)
IgA: 73 mg/dL (ref 69–380)
IgM, Serum: 28 mg/dL — ABNORMAL LOW (ref 52–322)

## 2014-03-12 ENCOUNTER — Other Ambulatory Visit: Payer: Self-pay | Admitting: Internal Medicine

## 2014-03-12 ENCOUNTER — Other Ambulatory Visit: Payer: Self-pay | Admitting: Medical Oncology

## 2014-03-12 DIAGNOSIS — D472 Monoclonal gammopathy: Secondary | ICD-10-CM

## 2014-03-13 ENCOUNTER — Telehealth: Payer: Self-pay | Admitting: Internal Medicine

## 2014-03-13 NOTE — Telephone Encounter (Signed)
xray already on schedule 10/9. central to contact pt. no other orders per 5/12 pof.

## 2014-05-01 DIAGNOSIS — B9789 Other viral agents as the cause of diseases classified elsewhere: Secondary | ICD-10-CM | POA: Diagnosis not present

## 2014-05-16 ENCOUNTER — Encounter: Payer: Self-pay | Admitting: Internal Medicine

## 2014-07-01 ENCOUNTER — Encounter: Payer: Self-pay | Admitting: Internal Medicine

## 2014-07-18 DIAGNOSIS — R569 Unspecified convulsions: Secondary | ICD-10-CM | POA: Diagnosis not present

## 2014-07-18 DIAGNOSIS — M899 Disorder of bone, unspecified: Secondary | ICD-10-CM | POA: Diagnosis not present

## 2014-07-18 DIAGNOSIS — F411 Generalized anxiety disorder: Secondary | ICD-10-CM | POA: Diagnosis not present

## 2014-07-18 DIAGNOSIS — I10 Essential (primary) hypertension: Secondary | ICD-10-CM | POA: Diagnosis not present

## 2014-07-18 DIAGNOSIS — M949 Disorder of cartilage, unspecified: Secondary | ICD-10-CM | POA: Diagnosis not present

## 2014-07-25 DIAGNOSIS — Z23 Encounter for immunization: Secondary | ICD-10-CM | POA: Diagnosis not present

## 2014-07-25 DIAGNOSIS — S6390XA Sprain of unspecified part of unspecified wrist and hand, initial encounter: Secondary | ICD-10-CM | POA: Diagnosis not present

## 2014-07-25 DIAGNOSIS — W4909XA Other specified item causing external constriction, initial encounter: Secondary | ICD-10-CM | POA: Diagnosis not present

## 2014-08-08 ENCOUNTER — Ambulatory Visit (AMBULATORY_SURGERY_CENTER): Payer: Self-pay | Admitting: *Deleted

## 2014-08-08 VITALS — Ht 63.0 in | Wt 120.2 lb

## 2014-08-08 DIAGNOSIS — Z86010 Personal history of colonic polyps: Secondary | ICD-10-CM

## 2014-08-08 MED ORDER — MOVIPREP 100 G PO SOLR: ORAL | Status: DC

## 2014-08-08 NOTE — Progress Notes (Signed)
No allergies to eggs or soy. No problems with anesthesia.  Pt given Emmi instructions for colonoscopy  No oxygen use  No diet drug use  

## 2014-08-09 ENCOUNTER — Ambulatory Visit (HOSPITAL_COMMUNITY)
Admission: RE | Admit: 2014-08-09 | Discharge: 2014-08-09 | Disposition: A | Discharge status: Home / Self Care | Payer: Medicare Other | Source: Ambulatory Visit | Attending: Internal Medicine | Admitting: Internal Medicine

## 2014-08-09 DIAGNOSIS — D472 Monoclonal gammopathy: Secondary | ICD-10-CM | POA: Insufficient documentation

## 2014-08-12 ENCOUNTER — Ambulatory Visit (HOSPITAL_BASED_OUTPATIENT_CLINIC_OR_DEPARTMENT_OTHER): Payer: Medicare Other | Admitting: Hematology

## 2014-08-12 ENCOUNTER — Encounter: Payer: Self-pay | Admitting: Hematology

## 2014-08-12 ENCOUNTER — Telehealth: Payer: Self-pay | Admitting: Hematology

## 2014-08-12 ENCOUNTER — Other Ambulatory Visit (HOSPITAL_BASED_OUTPATIENT_CLINIC_OR_DEPARTMENT_OTHER): Payer: Medicare Other

## 2014-08-12 VITALS — BP 141/77 | HR 62 | Temp 98.3°F | Resp 18 | Ht 63.0 in | Wt 121.1 lb

## 2014-08-12 DIAGNOSIS — D72819 Decreased white blood cell count, unspecified: Secondary | ICD-10-CM | POA: Diagnosis not present

## 2014-08-12 DIAGNOSIS — D509 Iron deficiency anemia, unspecified: Secondary | ICD-10-CM | POA: Diagnosis not present

## 2014-08-12 DIAGNOSIS — D472 Monoclonal gammopathy: Secondary | ICD-10-CM

## 2014-08-12 DIAGNOSIS — D126 Benign neoplasm of colon, unspecified: Secondary | ICD-10-CM

## 2014-08-12 LAB — COMPREHENSIVE METABOLIC PANEL (CC13)
ALK PHOS: 108 U/L (ref 40–150)
ALT: 42 U/L (ref 0–55)
AST: 25 U/L (ref 5–34)
Albumin: 3.4 g/dL — ABNORMAL LOW (ref 3.5–5.0)
Anion Gap: 8 mEq/L (ref 3–11)
BILIRUBIN TOTAL: 0.23 mg/dL (ref 0.20–1.20)
BUN: 14.8 mg/dL (ref 7.0–26.0)
CALCIUM: 9.3 mg/dL (ref 8.4–10.4)
CHLORIDE: 103 meq/L (ref 98–109)
CO2: 24 mEq/L (ref 22–29)
CREATININE: 0.9 mg/dL (ref 0.6–1.1)
Glucose: 111 mg/dl (ref 70–140)
Potassium: 4.6 mEq/L (ref 3.5–5.1)
Sodium: 135 mEq/L — ABNORMAL LOW (ref 136–145)
Total Protein: 6.5 g/dL (ref 6.4–8.3)

## 2014-08-12 LAB — CBC WITH DIFFERENTIAL/PLATELET
BASO%: 2 % (ref 0.0–2.0)
Basophils Absolute: 0.1 10*3/uL (ref 0.0–0.1)
EOS%: 2.9 % (ref 0.0–7.0)
Eosinophils Absolute: 0.1 10*3/uL (ref 0.0–0.5)
HCT: 37.9 % (ref 34.8–46.6)
HGB: 12.5 g/dL (ref 11.6–15.9)
LYMPH#: 1.9 10*3/uL (ref 0.9–3.3)
LYMPH%: 49.2 % (ref 14.0–49.7)
MCH: 30.6 pg (ref 25.1–34.0)
MCHC: 33.1 g/dL (ref 31.5–36.0)
MCV: 92.4 fL (ref 79.5–101.0)
MONO#: 0.5 10*3/uL (ref 0.1–0.9)
MONO%: 13.2 % (ref 0.0–14.0)
NEUT#: 1.3 10*3/uL — ABNORMAL LOW (ref 1.5–6.5)
NEUT%: 32.7 % — AB (ref 38.4–76.8)
Platelets: 372 10*3/uL (ref 145–400)
RBC: 4.1 10*6/uL (ref 3.70–5.45)
RDW: 14.8 % — ABNORMAL HIGH (ref 11.2–14.5)
WBC: 3.9 10*3/uL (ref 3.9–10.3)

## 2014-08-12 NOTE — Progress Notes (Signed)
[facility] PROGRESS NOTE DATE OF VISIT: 08/12/2014  Reginia Naas, MD [place], [place] [address]  DIAGNOSIS: Smothering MM/ MGUS (monoclonal gammopathy of unknown significance) - Plan: CBC with Differential, Comprehensive metabolic panel (Cmet) - [facility], SPEP & IFE with QIG, Kappa/lambda light chains  Chief Complaint  Patient presents with  . Follow-up    CURRENT THERAPY: Observation; ferrous sulfate 325 mg daily.  INTERVAL HISTORY:  Catherine Bullock 68 y.o. female with a history of IgG lambda monoclonal gammopathy of undetermined significance/ Smothering MM, mild leukopenia/neutropenia, and a history of iron deficiency anemia presents for followup. She was last seen by Dr Juliann Mule on 02/11/2014.   Today, she is accompanied by her husband Mr. [employer]. She reports no recent hospitalizations or emergency room visits. Also she reports that she is still feeling overall very well. She continues to take one iron sulfate tablet daily. Repeat colonoscopy is scheduled for 08/23/2014. Her last colonoscopy in February 2010 and revealed a tubular adenoma of the colon. She is also scheduled for mammogram in November without mammographic evidence of malignancy. Bone density test done with a little bit worst but observing for now.  This is being followed by Dr. Matthew Saras, her Ob/Gyn.  She is scheduled to see Dr. Tamala Julian in January 2016 for her bi-annual exam. She reports that her weight is stable with a good appetite. She also denies fever, chills, acute shortness of breath.  She denies bone pain or palpitations or dyspnea on exertion.  She did get a flu and a pneumonia shot this year. She did have a bone survey done 08/09/2014 which showed no suspicious bone lesions. I reviewed that. Her white count and hemoglobin is better today.  MEDICAL HISTORY: Past Medical History  Diagnosis Date  . Iron deficiency anemia   . Thrombocytosis   . Hypertension    . MVP (mitral valve prolapse)   . Anxiety   . Osteoporosis   . Degenerative disk disease   . IgG monoclonal gammopathy   . Colon adenoma   . GERD (gastroesophageal reflux disease)   . Epilepsy     last seizure 1987    INTERIM HISTORY: has Leukopenia; MGUS (monoclonal gammopathy of unknown significance); and Anemia, iron deficiency on her problem list.    ALLERGIES:  has No Known Allergies.  MEDICATIONS: has a current medication list which includes the following prescription(s): calcium-vitamin d-vitamin k, coenzyme q10 liposomal, docusate sodium, escitalopram, ferrous sulfate, fish oil-omega-3 fatty acids, omeprazole magnesium, phenytoin, probiotic product, valsartan, moviprep, and multivitamin.  SURGICAL HISTORY:  Past Surgical History  Procedure Laterality Date  . Breast biopsy  1992    benign  . Tonsillectomy  1961    REVIEW OF SYSTEMS:   Constitutional: Denies fevers, chills or abnormal weight loss Eyes: Denies blurriness of vision Ears, nose, mouth, throat, and face: Denies mucositis or sore throat Respiratory: Denies cough, dyspnea or wheezes Cardiovascular: Denies palpitation, chest discomfort or lower extremity swelling Gastrointestinal:  Denies nausea, heartburn or change in bowel habits Skin: Denies abnormal skin rashes Lymphatics: Denies new lymphadenopathy or easy bruising Neurological:Denies numbness, tingling or new weaknesses Behavioral/Psych: Mood is stable, no new changes  All other systems were reviewed with the patient and are negative.  PHYSICAL EXAMINATION: ECOG PERFORMANCE STATUS: 0  Blood pressure 141/77, pulse 62, temperature 98.3 F (36.8 C), temperature source Oral, resp. rate 18, height 5' 3"  (1.6 m), weight 121 lb 1.6 oz (54.931 kg).  GENERAL:alert, no distress and comfortable;  well developed and well nourished.  SKIN: skin color, texture, turgor are normal, no rashes or significant lesions EYES: normal, Conjunctiva are pink and  non-injected, sclera clear OROPHARYNX:no exudate, no erythema and lips, buccal mucosa, and tongue normal  NECK: supple, thyroid normal size, non-tender, without nodularity LYMPH:  no palpable lymphadenopathy in the cervical, axillary or supraclavicular LUNGS: clear to auscultation and percussion with normal breathing effort HEART: regular rate & rhythm and no murmurs and no lower extremity edema ABDOMEN:abdomen soft, non-tender and normal bowel sounds Musculoskeletal:no cyanosis of digits and no clubbing; arthritic changes of the hands bilaterally. NEURO: alert & oriented x 3 with fluent speech, no focal motor/sensory deficits  LABORATORY DATA: Results for orders placed in visit on 08/12/14 (from the past 48 hour(s))  CBC WITH DIFFERENTIAL     Status: Abnormal   Collection Time    08/12/14  8:27 AM      Result Value Ref Range   WBC 3.9  3.9 - 10.3 10e3/uL   NEUT# 1.3 (*) 1.5 - 6.5 10e3/uL   HGB 12.5  11.6 - 15.9 g/dL   HCT 37.9  34.8 - 46.6 %   Platelets 372  145 - 400 10e3/uL   MCV 92.4  79.5 - 101.0 fL   MCH 30.6  25.1 - 34.0 pg   MCHC 33.1  31.5 - 36.0 g/dL   RBC 4.10  3.70 - 5.45 10e6/uL   RDW 14.8 (*) 11.2 - 14.5 %   lymph# 1.9  0.9 - 3.3 10e3/uL   MONO# 0.5  0.1 - 0.9 10e3/uL   Eosinophils Absolute 0.1  0.0 - 0.5 10e3/uL   Basophils Absolute 0.1  0.0 - 0.1 10e3/uL   NEUT% 32.7 (*) 38.4 - 76.8 %   LYMPH% 49.2  14.0 - 49.7 %   MONO% 13.2  0.0 - 14.0 %   EOS% 2.9  0.0 - 7.0 %   BASO% 2.0  0.0 - 2.0 %  COMPREHENSIVE METABOLIC PANEL (FW26)     Status: Abnormal   Collection Time    08/12/14  8:27 AM      Result Value Ref Range   Sodium 135 (*) 136 - 145 mEq/L   Potassium 4.6  3.5 - 5.1 mEq/L   Chloride 103  98 - 109 mEq/L   CO2 24  22 - 29 mEq/L   Glucose 111  70 - 140 mg/dl   BUN 14.8  7.0 - 26.0 mg/dL   Creatinine 0.9  0.6 - 1.1 mg/dL   Total Bilirubin 0.23  0.20 - 1.20 mg/dL   Alkaline Phosphatase 108  40 - 150 U/L   AST 25  5 - 34 U/L   ALT 42  0 - 55 U/L   Total  Protein 6.5  6.4 - 8.3 g/dL   Albumin 3.4 (*) 3.5 - 5.0 g/dL   Calcium 9.3  8.4 - 10.4 mg/dL   Anion Gap 8  3 - 11 mEq/L    Iron/TIBC/Ferritin    Component Value Date/Time   IRON 183* 08/13/2013 1046   IRON 178* 12/25/2012 1323   TIBC 260 08/13/2013 1046   TIBC 296 12/25/2012 1323   FERRITIN 178 08/13/2013 1046   FERRITIN 193 12/25/2012 1323    RADIOGRAPHIC STUDIES: METASTATIC BONE SURVEY 08/09/2014  EXAM:  METASTATIC BONE SURVEY  COMPARISON: 11/18/2009  FINDINGS:  No lytic or blastic osseous lesions are identified throughout the  imaged portion of the skeleton. Mild-to-moderate spondylosis is  again seen at C5-6 and C6-7. Cervical facet  arthrosis is again  noted. Multilevel osteophytosis is present in the thoracic and  lumbar spine. Cardiomediastinal silhouette is within normal limits.  The lungs are clear. Small, linear soft tissue calcifications are  noted at the ulnar aspect of the left wrist and may reflect  hydroxyapatite deposition.  IMPRESSION:  No suspicious bone lesions identified.      ASSESSMENT: Catherine Bullock 69 y.o. female with a history of MGUS (monoclonal gammopathy of unknown significance) - Plan: CBC with Differential, Comprehensive metabolic panel (Cmet) - [facility], SPEP & IFE with QIG, Kappa/lambda light chains   PLAN:   1. IgG lambda MGUS / Smothering MM. --We reviewed with the patient extensively what the diagnosis of Smothering MM involved including the risks of progression to multiple myeloma at 5% per year for first 10 years. This also includes the workup and the current treatment recommendation is observation. As noted above her metastatic bone survey in January 2011 was negative and a repeat one was negative also on few days ago . Her bone marrow biopsy on 11/04/2009 showed 13% plasma cells. Her IgG levels on 08/13/2013 was 541, on 12/25/2012 was 655 compared to her IgG levels on 06/20/2012 of 622. We will await repeat protein electrophoresis  measurements. Patient can requests continued surveillance every 6 months. We will repeat her CBC, CMP, immunoglobulins (quantitative) in 6 months. Her myeloma markers are pending and we will call her with results.   2. History of iron deficiency anemia --Today she denies any symptoms of anemia. He will continue to take ferrous sulfate 325 mg daily.She is scheduled for colonoscopy later this month.    3. History of Thrombocytosis (JAK2 negative), resolved. --Today her platelets reveal a count of 364,000 which is within normal limits. Could be from iron deficiency.  4. History of Leukopenia (early 2011). --Her WBC are 3.9 (up from 3.5) today.  ANC of 1.6 (up from 1.3). This is low normal today.  She was counseled on if she should have recurrent infections or fevers greater than 100.5 that she needs to call the M.D. on call. She denied any history of dysuria, cough, fever or chills.  5. History of tubular adenoma of the colon (12/06/2008) --She is scheduled for repeat colonoscopy in 08/23/2014 2015.  All questions were answered. The patient knows to call the clinic with any problems, questions or concerns. We can certainly see the patient much sooner if necessary. Patient was provided after visit summary  I spent 15 minutes counseling the patient face to face. The total time spent in the appointment was 25 minutes.    Bernadene Bell, MD Medical Hematologist/Oncologist [place] Pager: [phone] Office No: [phone]

## 2014-08-12 NOTE — Telephone Encounter (Signed)
gv pt appt schedule for april 2016.  °

## 2014-08-15 LAB — SPEP & IFE WITH QIG
Albumin ELP: 64.2 % (ref 55.8–66.1)
Alpha-1-Globulin: 4.5 % (ref 2.9–4.9)
Alpha-2-Globulin: 12.1 % — ABNORMAL HIGH (ref 7.1–11.8)
BETA 2: 2.9 % — AB (ref 3.2–6.5)
Beta Globulin: 5.9 % (ref 4.7–7.2)
Gamma Globulin: 10.4 % — ABNORMAL LOW (ref 11.1–18.8)
IGA: 73 mg/dL (ref 69–380)
IGM, SERUM: 44 mg/dL — AB (ref 52–322)
IgG (Immunoglobin G), Serum: 531 mg/dL — ABNORMAL LOW (ref 690–1700)
M-Spike, %: 0.24 g/dL
Total Protein, Serum Electrophoresis: 6.2 g/dL (ref 6.0–8.3)

## 2014-08-15 LAB — KAPPA/LAMBDA LIGHT CHAINS
KAPPA FREE LGHT CHN: 1.42 mg/dL (ref 0.33–1.94)
Kappa:Lambda Ratio: 0.02 — ABNORMAL LOW (ref 0.26–1.65)
LAMBDA FREE LGHT CHN: 91.6 mg/dL — AB (ref 0.57–2.63)

## 2014-08-23 ENCOUNTER — Other Ambulatory Visit: Payer: Self-pay

## 2014-08-23 ENCOUNTER — Ambulatory Visit (AMBULATORY_SURGERY_CENTER): Payer: Medicare Other | Admitting: Internal Medicine

## 2014-08-23 ENCOUNTER — Encounter: Payer: Self-pay | Admitting: Internal Medicine

## 2014-08-23 VITALS — BP 107/57 | HR 56 | Temp 96.4°F | Resp 22 | Ht 63.0 in | Wt 120.0 lb

## 2014-08-23 DIAGNOSIS — D509 Iron deficiency anemia, unspecified: Secondary | ICD-10-CM | POA: Diagnosis not present

## 2014-08-23 DIAGNOSIS — D120 Benign neoplasm of cecum: Secondary | ICD-10-CM | POA: Diagnosis not present

## 2014-08-23 DIAGNOSIS — Z86010 Personal history of colonic polyps: Secondary | ICD-10-CM | POA: Diagnosis not present

## 2014-08-23 DIAGNOSIS — D122 Benign neoplasm of ascending colon: Secondary | ICD-10-CM

## 2014-08-23 DIAGNOSIS — I10 Essential (primary) hypertension: Secondary | ICD-10-CM | POA: Diagnosis not present

## 2014-08-23 DIAGNOSIS — Z1231 Encounter for screening mammogram for malignant neoplasm of breast: Secondary | ICD-10-CM

## 2014-08-23 MED ORDER — SODIUM CHLORIDE 0.9 % IV SOLN: 500.0000 mL | INTRAVENOUS | Status: DC

## 2014-08-23 NOTE — Progress Notes (Signed)
Called to room to assist during endoscopic procedure.  Patient ID and intended procedure confirmed with present staff. Received instructions for my participation in the procedure from the performing physician.  

## 2014-08-23 NOTE — Patient Instructions (Signed)
YOU HAD AN ENDOSCOPIC PROCEDURE TODAY AT THE [facility]: Refer to the procedure report that was given to you for any specific questions about what was found during the examination.  If the procedure report does not answer your questions, please call your gastroenterologist to clarify.  If you requested that your care partner not be given the details of your procedure findings, then the procedure report has been included in a sealed envelope for you to review at your convenience later.  YOU SHOULD EXPECT: Some feelings of bloating in the abdomen. Passage of more gas than usual.  Walking can help get rid of the air that was put into your GI tract during the procedure and reduce the bloating. If you had a lower endoscopy (such as a colonoscopy or flexible sigmoidoscopy) you may notice spotting of blood in your stool or on the toilet paper. If you underwent a bowel prep for your procedure, then you may not have a normal bowel movement for a few days.  DIET: Your first meal following the procedure should be a light meal and then it is ok to progress to your normal diet.  A half-sandwich or bowl of soup is an example of a good first meal.  Heavy or fried foods are harder to digest and may make you feel nauseous or bloated.  Likewise meals heavy in dairy and vegetables can cause extra gas to form and this can also increase the bloating.  Drink plenty of fluids but you should avoid alcoholic beverages for 24 hours.  ACTIVITY: Your care partner should take you home directly after the procedure.  You should plan to take it easy, moving slowly for the rest of the day.  You can resume normal activity the day after the procedure however you should NOT DRIVE or use heavy machinery for 24 hours (because of the sedation medicines used during the test).    SYMPTOMS TO REPORT IMMEDIATELY: A gastroenterologist can be reached at any hour.  During normal business hours, 8:30 AM to 5:00 PM Monday through Friday,  call [phone].  After hours and on weekends, please call the GI answering service at [phone] who will take a message and have the physician on call contact you.   Following lower endoscopy (colonoscopy or flexible sigmoidoscopy):  Excessive amounts of blood in the stool  Significant tenderness or worsening of abdominal pains  Swelling of the abdomen that is new, acute  Fever of 100F or higher    FOLLOW UP: If any biopsies were taken you will be contacted by phone or by letter within the next 1-3 weeks.  Call your gastroenterologist if you have not heard about the biopsies in 3 weeks.  Our staff will call the home number listed on your records the next business day following your procedure to check on you and address any questions or concerns that you may have at that time regarding the information given to you following your procedure. This is a courtesy call and so if there is no answer at the home number and we have not heard from you through the emergency physician on call, we will assume that you have returned to your regular daily activities without incident.  SIGNATURES/CONFIDENTIALITY: You and/or your care partner have signed paperwork which will be entered into your electronic medical record.  These signatures attest to the fact that that the information above on your After Visit Summary has been reviewed and is understood.  Full responsibility of the confidentiality   of this discharge information lies with you and/or your care-partner.  Polyp and high fiber diet information given.

## 2014-08-23 NOTE — Op Note (Signed)
[facility]  [address] [place], [address]   COLONOSCOPY PROCEDURE REPORT  PATIENT: Catherine Bullock, Catherine Bullock  MR#: [id number] BIRTHDATE: 30-Dec-1945 , 67  yrs. old GENDER: female ENDOSCOPIST: Lafayette Dragon, MD REFERRED ZM:[id number] Tamala Julian, M.D. PROCEDURE DATE:  08/23/2014 PROCEDURE:   Colonoscopy with cold biopsy polypectomy First Screening Colonoscopy - Avg.  risk and is 50 yrs.  old or older - No.  Prior Negative Screening - Now for repeat screening. N/A  History of Adenoma - Now for follow-up colonoscopy & has been > or = to 3 yrs.  Yes hx of adenoma.  Has been 3 or more years since last colonoscopy.  Polyps Removed Today? Yes. ASA CLASS:   Class II INDICATIONS:adenomatous polyp removed on colonoscopy in February 2010. MEDICATIONS: Monitored anesthesia care and Propofol 250 mg IV  DESCRIPTION OF PROCEDURE:   After the risks benefits and alternatives of the procedure were thoroughly explained, informed consent was obtained.  The digital rectal exam revealed no abnormalities of the rectum.   The LB [id number]  endoscope was introduced through the anus and advanced to the cecum, which was identified by both the appendix and ileocecal valve. No adverse events experienced.   The quality of the prep was good, using MoviPrep  The instrument was then slowly withdrawn as the colon was fully examined.      COLON FINDINGS: Two sessile polyps measuring 5 mm in size were found in the ascending colon and at the cecum.  A polypectomy was performed with cold forceps.  The resection was complete, the polyp tissue was completely retrieved and sent to histology.   Mild melanosis coli.  Retroflexed views revealed no abnormalities. The time to cecum=8 minutes 31 seconds.  Withdrawal time=9 minutes 48 seconds.  The scope was withdrawn and the procedure completed. COMPLICATIONS: There were no immediate complications.  ENDOSCOPIC IMPRESSION: 1.   Two sessile polyps were found in  the ascending colon and at the cecum; polypectomy was performed with cold forceps 2.   Mild melanosis coli  RECOMMENDATIONS: 1.  Await pathology results 2.  High fiber diet recall colonoscopy pending path report  eSigned:  Lafayette Dragon, MD 08/23/2014 8:35 AM   cc:   PATIENT NAME:  [name], [name] MR#: [id number]

## 2014-08-23 NOTE — Progress Notes (Signed)
Procedure ends, to recovery, report given and VSS. 

## 2014-08-26 ENCOUNTER — Telehealth: Payer: Self-pay

## 2014-08-26 NOTE — Telephone Encounter (Signed)
  Follow up Call-  Call back number 08/23/2014  Post procedure Call Back phone  # [phone]  Permission to leave phone message Yes     Patient questions:  Do you have a fever, pain , or abdominal swelling? No. Pain Score  0 *  Have you tolerated food without any problems? Yes.    Have you been able to return to your normal activities? Yes.    Do you have any questions about your discharge instructions: Diet   No. Medications  No. Follow up visit  No.  Do you have questions or concerns about your Care? No.  Actions: * If pain score is 4 or above: No action needed, pain <4.   No problems per the pt. maw

## 2014-08-27 ENCOUNTER — Encounter: Payer: Self-pay | Admitting: Internal Medicine

## 2014-08-31 DIAGNOSIS — S0181XA Laceration without foreign body of other part of head, initial encounter: Secondary | ICD-10-CM | POA: Diagnosis not present

## 2014-09-03 DIAGNOSIS — H43811 Vitreous degeneration, right eye: Secondary | ICD-10-CM | POA: Diagnosis not present

## 2014-09-03 DIAGNOSIS — H2513 Age-related nuclear cataract, bilateral: Secondary | ICD-10-CM | POA: Diagnosis not present

## 2014-09-24 ENCOUNTER — Ambulatory Visit
Admission: RE | Admit: 2014-09-24 | Discharge: 2014-09-24 | Disposition: A | Discharge status: Home / Self Care | Payer: Medicare Other | Source: Ambulatory Visit

## 2014-09-24 DIAGNOSIS — Z1231 Encounter for screening mammogram for malignant neoplasm of breast: Secondary | ICD-10-CM | POA: Diagnosis not present

## 2014-11-11 ENCOUNTER — Other Ambulatory Visit: Payer: Self-pay | Admitting: Obstetrics and Gynecology

## 2014-11-11 DIAGNOSIS — Z124 Encounter for screening for malignant neoplasm of cervix: Secondary | ICD-10-CM | POA: Diagnosis not present

## 2014-11-12 LAB — CYTOLOGY - PAP

## 2014-12-13 DIAGNOSIS — L570 Actinic keratosis: Secondary | ICD-10-CM | POA: Diagnosis not present

## 2014-12-13 DIAGNOSIS — L814 Other melanin hyperpigmentation: Secondary | ICD-10-CM | POA: Diagnosis not present

## 2014-12-13 DIAGNOSIS — L821 Other seborrheic keratosis: Secondary | ICD-10-CM | POA: Diagnosis not present

## 2014-12-13 DIAGNOSIS — D225 Melanocytic nevi of trunk: Secondary | ICD-10-CM | POA: Diagnosis not present

## 2015-01-01 ENCOUNTER — Telehealth: Payer: Self-pay | Admitting: Internal Medicine

## 2015-01-01 NOTE — Telephone Encounter (Signed)
Called and lm with new dr Catherine Bullock appt  Catherine Bullock

## 2015-02-19 ENCOUNTER — Other Ambulatory Visit: Payer: Medicare Other

## 2015-02-19 ENCOUNTER — Other Ambulatory Visit (HOSPITAL_BASED_OUTPATIENT_CLINIC_OR_DEPARTMENT_OTHER): Payer: Medicare Other

## 2015-02-19 ENCOUNTER — Telehealth: Payer: Self-pay | Admitting: Internal Medicine

## 2015-02-19 ENCOUNTER — Other Ambulatory Visit: Payer: Self-pay | Admitting: *Deleted

## 2015-02-19 ENCOUNTER — Encounter: Payer: Self-pay | Admitting: Internal Medicine

## 2015-02-19 ENCOUNTER — Ambulatory Visit (HOSPITAL_BASED_OUTPATIENT_CLINIC_OR_DEPARTMENT_OTHER): Payer: Medicare Other | Admitting: Internal Medicine

## 2015-02-19 ENCOUNTER — Ambulatory Visit: Payer: Medicare Other

## 2015-02-19 VITALS — BP 146/82 | HR 55 | Temp 98.4°F | Resp 18 | Ht 63.0 in | Wt 122.8 lb

## 2015-02-19 DIAGNOSIS — E8809 Other disorders of plasma-protein metabolism, not elsewhere classified: Secondary | ICD-10-CM | POA: Diagnosis not present

## 2015-02-19 DIAGNOSIS — D473 Essential (hemorrhagic) thrombocythemia: Secondary | ICD-10-CM | POA: Diagnosis not present

## 2015-02-19 DIAGNOSIS — D72819 Decreased white blood cell count, unspecified: Secondary | ICD-10-CM

## 2015-02-19 DIAGNOSIS — D472 Monoclonal gammopathy: Secondary | ICD-10-CM | POA: Diagnosis not present

## 2015-02-19 DIAGNOSIS — D509 Iron deficiency anemia, unspecified: Secondary | ICD-10-CM

## 2015-02-19 LAB — CBC WITH DIFFERENTIAL/PLATELET
BASO%: 2.2 % — ABNORMAL HIGH (ref 0.0–2.0)
Basophils Absolute: 0.1 10*3/uL (ref 0.0–0.1)
EOS ABS: 0.1 10*3/uL (ref 0.0–0.5)
EOS%: 1.8 % (ref 0.0–7.0)
HCT: 37.8 % (ref 34.8–46.6)
HEMOGLOBIN: 12.7 g/dL (ref 11.6–15.9)
LYMPH#: 1.9 10*3/uL (ref 0.9–3.3)
LYMPH%: 46.5 % (ref 14.0–49.7)
MCH: 30.5 pg (ref 25.1–34.0)
MCHC: 33.7 g/dL (ref 31.5–36.0)
MCV: 90.6 fL (ref 79.5–101.0)
MONO#: 0.5 10*3/uL (ref 0.1–0.9)
MONO%: 12 % (ref 0.0–14.0)
NEUT%: 37.5 % — ABNORMAL LOW (ref 38.4–76.8)
NEUTROS ABS: 1.6 10*3/uL (ref 1.5–6.5)
Platelets: 422 10*3/uL — ABNORMAL HIGH (ref 145–400)
RBC: 4.17 10*6/uL (ref 3.70–5.45)
RDW: 14.8 % — AB (ref 11.2–14.5)
WBC: 4.2 10*3/uL (ref 3.9–10.3)

## 2015-02-19 LAB — COMPREHENSIVE METABOLIC PANEL (CC13)
ALBUMIN: 3.5 g/dL (ref 3.5–5.0)
ALT: 40 U/L (ref 0–55)
AST: 25 U/L (ref 5–34)
Alkaline Phosphatase: 84 U/L (ref 40–150)
Anion Gap: 13 mEq/L — ABNORMAL HIGH (ref 3–11)
BUN: 16.3 mg/dL (ref 7.0–26.0)
CALCIUM: 9.1 mg/dL (ref 8.4–10.4)
CHLORIDE: 102 meq/L (ref 98–109)
CO2: 22 mEq/L (ref 22–29)
CREATININE: 0.9 mg/dL (ref 0.6–1.1)
EGFR: 66 mL/min/{1.73_m2} — ABNORMAL LOW (ref >90)
Glucose: 113 mg/dl (ref 70–140)
POTASSIUM: 4.9 meq/L (ref 3.5–5.1)
Sodium: 137 mEq/L (ref 136–145)
Total Bilirubin: <0.2 mg/dL (ref 0.20–1.20)
Total Protein: 6.3 g/dL — ABNORMAL LOW (ref 6.4–8.3)

## 2015-02-19 NOTE — Progress Notes (Signed)
[facility] Telephone:([phone]   Fax:(336) [place], [place] [address]  DIAGNOSIS:  1) plasma cell dyscrasia diagnosed in January 2011. 2) history of essential thrombocythemia  PRIOR THERAPY: The patient was treated in the past with anagrelide for essential thrombocythemia under the care of Dr. Jana Hakim.  CURRENT THERAPY: Observation.  INTERVAL HISTORY: Catherine Bullock 69 y.o. female returns to the clinic today for follow-up visit accompanied by her husband. She is a former patient of Dr. Ralene Ok and Dr. Juliann Mule. She is here today to establish care with me after these physicians left the practice. The patient mentions that she was diagnosed 11 years ago with essential thrombocythemia treated with anagrelide under the care of Dr. Jana Hakim and in 2011 she was diagnosed with plasma cell dyscrasia after bone marrow biopsy and aspirate showed 13% plasma cells with the elevated monoclonal gammopathy and free lambda light chain. The patient has been followed by observation since that time. She has not received any active treatment for her condition. She is feeling fine today was no specific complaints. She denied having any significant weight loss or night sweats. She has no nausea or vomiting, no fever or chills. She denied having any significant weight loss or night sweats.  MEDICAL HISTORY: Past Medical History  Diagnosis Date  . Iron deficiency anemia   . Thrombocytosis   . Hypertension   . MVP (mitral valve prolapse)   . Anxiety   . Osteoporosis   . Degenerative disk disease   . IgG monoclonal gammopathy   . Colon adenoma   . GERD (gastroesophageal reflux disease)   . Epilepsy     last seizure 1987    ALLERGIES:  has No Known Allergies.  MEDICATIONS:  Current Outpatient Prescriptions  Medication Sig Dispense Refill  . Calcium-Vitamin D-Vitamin K 500-100-40 MG-UNT-MCG CHEW Chew  2 capsules by mouth daily.    . Coenzyme Q10 Liposomal 100 MG/ML LIQD Take by mouth daily.    Marland Kitchen escitalopram (LEXAPRO) 20 MG tablet Take 20 mg by mouth daily.    . ferrous sulfate 325 (65 FE) MG tablet Take 325 mg by mouth daily with breakfast.    . fish oil-omega-3 fatty acids 1000 MG capsule Take 1 g by mouth daily.    . Multiple Vitamin (MULTIVITAMIN) tablet Take 1 tablet by mouth daily.    . Omeprazole Magnesium (PRILOSEC OTC PO) Take by mouth daily.    . Probiotic Product (PROBIOTIC DAILY PO) Take by mouth daily.    . valsartan (DIOVAN) 160 MG tablet Take 160 mg by mouth daily.    Marland Kitchen docusate sodium (COLACE) 100 MG capsule Take 100 mg by mouth 2 (two) times daily.    . phenytoin (DILANTIN) 100 MG ER capsule Take by mouth 2 (two) times daily. 2 capsules in the am and 1 capsule in the pm     No current facility-administered medications for this visit.    SURGICAL HISTORY:  Past Surgical History  Procedure Laterality Date  . Breast biopsy  1992    benign  . Tonsillectomy  1961    REVIEW OF SYSTEMS:  Constitutional: negative Eyes: negative Ears, nose, mouth, throat, and face: negative Respiratory: negative Cardiovascular: negative Gastrointestinal: negative Genitourinary:negative Integument/breast: negative Hematologic/lymphatic: negative Musculoskeletal:negative Neurological: negative Behavioral/Psych: negative Endocrine: negative Allergic/Immunologic: negative   PHYSICAL EXAMINATION: General appearance: alert, cooperative and no distress Head: Normocephalic, without obvious abnormality, atraumatic Neck: no  adenopathy, no JVD, supple, symmetrical, trachea midline and thyroid not enlarged, symmetric, no tenderness/mass/nodules Lymph nodes: Cervical, supraclavicular, and axillary nodes normal. Resp: clear to auscultation bilaterally Back: symmetric, no curvature. ROM normal. No CVA tenderness. Cardio: regular rate and rhythm, S1, S2 normal, no murmur, click, rub or  gallop GI: soft, non-tender; bowel sounds normal; no masses,  no organomegaly Extremities: extremities normal, atraumatic, no cyanosis or edema Neurologic: Alert and oriented X 3, normal strength and tone. Normal symmetric reflexes. Normal coordination and gait  ECOG PERFORMANCE STATUS: 0 - Asymptomatic  Blood pressure 146/82, pulse 55, temperature 98.4 F (36.9 C), temperature source Oral, resp. rate 18, height 5' 3"  (1.6 m), weight 122 lb 12.8 oz (55.702 kg).  LABORATORY DATA: Lab Results  Component Value Date   WBC 4.2 02/19/2015   HGB 12.7 02/19/2015   HCT 37.8 02/19/2015   MCV 90.6 02/19/2015   PLT 422* 02/19/2015      Chemistry      Component Value Date/Time   NA 135* 08/12/2014 0827   NA 138 06/20/2012 0833   K 4.6 08/12/2014 0827   K 4.2 06/20/2012 0833   CL 99 12/25/2012 1323   CL 104 06/20/2012 0833   CO2 24 08/12/2014 0827   CO2 25 06/20/2012 0833   BUN 14.8 08/12/2014 0827   BUN 20 06/20/2012 0833   CREATININE 0.9 08/12/2014 0827   CREATININE 0.94 06/20/2012 0833   CREATININE 1.19 10/13/2009 0850      Component Value Date/Time   CALCIUM 9.3 08/12/2014 0827   CALCIUM 8.7 06/20/2012 0833   ALKPHOS 108 08/12/2014 0827   ALKPHOS 83 06/20/2012 0833   AST 25 08/12/2014 0827   AST 25 06/20/2012 0833   ALT 42 08/12/2014 0827   ALT 40* 06/20/2012 0833   BILITOT 0.23 08/12/2014 0827   BILITOT 0.3 06/20/2012 0833       RADIOGRAPHIC STUDIES: No results found.  ASSESSMENT AND PLAN:   This is a very pleasant 69 years old white female with history of plasma cell dyscrasia diagnosed in January 2011 has been on observation since that time with no active treatment.  The patient also has a history of essential thrombocythemia and currently on observation.  She is feeling fine today with no specific complaints. I recommended for the patient to have repeat myeloma panel as well as 24 hour urine protein electrophoresis today.  If no significant evidence for disease  progression, I will see the patient back for follow-up visit in 6 months with repeat myeloma panel.  For the history of essential thrombocythemia, her platelets count are acceptable today and the patient will continue on observation.  The patient was advised to call immediately if she has any concerning symptoms in the interval. The patient voices understanding of current disease status and treatment options and is in agreement with the current care plan.  All questions were answered. The patient knows to call the clinic with any problems, questions or concerns. We can certainly see the patient much sooner if necessary.  I spent 15 minutes counseling the patient face to face. The total time spent in the appointment was 25 minutes.  Disclaimer: This note was dictated with voice recognition software. Similar sounding words can inadvertently be transcribed and may not be corrected upon review.

## 2015-02-19 NOTE — Telephone Encounter (Signed)
gave and printed appt sched and avs fo rpt for OCT. °

## 2015-02-20 LAB — IGG, IGA, IGM
IgA: 75 mg/dL (ref 69–380)
IgG (Immunoglobin G), Serum: 580 mg/dL — ABNORMAL LOW (ref 690–1700)
IgM, Serum: 25 mg/dL — ABNORMAL LOW (ref 52–322)

## 2015-02-20 LAB — KAPPA/LAMBDA LIGHT CHAINS
Kappa free light chain: 0.93 mg/dL (ref 0.33–1.94)
Kappa:Lambda Ratio: 0.03 — ABNORMAL LOW (ref 0.26–1.65)
Lambda Free Lght Chn: 28.2 mg/dL — ABNORMAL HIGH (ref 0.57–2.63)

## 2015-02-21 DIAGNOSIS — D472 Monoclonal gammopathy: Secondary | ICD-10-CM | POA: Diagnosis not present

## 2015-02-25 LAB — UPEP/TP, 24-HR URINE
Collection Interval: 24 hours
TOTAL PROTEIN, URINE: 9 mg/dL
Total Protein, Urine/Day: 153 mg/d — ABNORMAL HIGH (ref 50–100)
Total Volume, Urine: 1700 mL

## 2015-03-24 DIAGNOSIS — S300XXA Contusion of lower back and pelvis, initial encounter: Secondary | ICD-10-CM | POA: Diagnosis not present

## 2015-03-24 DIAGNOSIS — S76212A Strain of adductor muscle, fascia and tendon of left thigh, initial encounter: Secondary | ICD-10-CM | POA: Diagnosis not present

## 2015-03-28 ENCOUNTER — Telehealth: Payer: Self-pay | Admitting: *Deleted

## 2015-03-28 NOTE — Telephone Encounter (Signed)
The results are better than her previous blood work. Continue observation as discussed during the visit.

## 2015-03-28 NOTE — Telephone Encounter (Signed)
TC from patient requesting results/interpretation of labs and 24 hr urine done February 19, 2015.  She has not heard from anyone about them

## 2015-04-01 NOTE — Telephone Encounter (Signed)
Pt notifed.

## 2015-04-11 DIAGNOSIS — M549 Dorsalgia, unspecified: Secondary | ICD-10-CM | POA: Diagnosis not present

## 2015-07-15 ENCOUNTER — Telehealth: Payer: Self-pay | Admitting: Internal Medicine

## 2015-07-15 NOTE — Telephone Encounter (Signed)
s.w. pt and r/s appt per pt request...pt ok and aware °

## 2015-07-16 ENCOUNTER — Telehealth: Payer: Self-pay | Admitting: Internal Medicine

## 2015-07-16 NOTE — Telephone Encounter (Signed)
Lft msg for pt confirming new D/T for MD visit due to provider schedule.... Pt should get updated schedule when she comes at next visit for labs.... KJ

## 2015-08-06 ENCOUNTER — Other Ambulatory Visit (HOSPITAL_BASED_OUTPATIENT_CLINIC_OR_DEPARTMENT_OTHER): Payer: Medicare Other

## 2015-08-06 DIAGNOSIS — D473 Essential (hemorrhagic) thrombocythemia: Secondary | ICD-10-CM | POA: Diagnosis present

## 2015-08-06 DIAGNOSIS — D472 Monoclonal gammopathy: Secondary | ICD-10-CM | POA: Diagnosis not present

## 2015-08-06 LAB — COMPREHENSIVE METABOLIC PANEL (CC13)
ALBUMIN: 3.4 g/dL — AB (ref 3.5–5.0)
ALK PHOS: 118 U/L (ref 40–150)
ALT: 45 U/L (ref 0–55)
ANION GAP: 8 meq/L (ref 3–11)
AST: 25 U/L (ref 5–34)
BUN: 21.2 mg/dL (ref 7.0–26.0)
CALCIUM: 9.4 mg/dL (ref 8.4–10.4)
CO2: 25 mEq/L (ref 22–29)
CREATININE: 0.9 mg/dL (ref 0.6–1.1)
Chloride: 107 mEq/L (ref 98–109)
EGFR: 69 mL/min/{1.73_m2} — ABNORMAL LOW (ref >90)
Glucose: 86 mg/dl (ref 70–140)
POTASSIUM: 4.4 meq/L (ref 3.5–5.1)
Sodium: 141 mEq/L (ref 136–145)
Total Bilirubin: <0.3 mg/dL (ref 0.20–1.20)
Total Protein: 6.6 g/dL (ref 6.4–8.3)

## 2015-08-06 LAB — CBC WITH DIFFERENTIAL/PLATELET
BASO%: 2.4 % — ABNORMAL HIGH (ref 0.0–2.0)
Basophils Absolute: 0.1 10*3/uL (ref 0.0–0.1)
EOS ABS: 0.2 10*3/uL (ref 0.0–0.5)
EOS%: 3.8 % (ref 0.0–7.0)
HCT: 39 % (ref 34.8–46.6)
HGB: 13.2 g/dL (ref 11.6–15.9)
LYMPH%: 42.8 % (ref 14.0–49.7)
MCH: 31.3 pg (ref 25.1–34.0)
MCHC: 33.8 g/dL (ref 31.5–36.0)
MCV: 92.7 fL (ref 79.5–101.0)
MONO#: 0.6 10*3/uL (ref 0.1–0.9)
MONO%: 14.6 % — ABNORMAL HIGH (ref 0.0–14.0)
NEUT%: 36.4 % — ABNORMAL LOW (ref 38.4–76.8)
NEUTROS ABS: 1.5 10*3/uL (ref 1.5–6.5)
Platelets: 402 10*3/uL — ABNORMAL HIGH (ref 145–400)
RBC: 4.21 10*6/uL (ref 3.70–5.45)
RDW: 14.5 % (ref 11.2–14.5)
WBC: 4.2 10*3/uL (ref 3.9–10.3)
lymph#: 1.8 10*3/uL (ref 0.9–3.3)

## 2015-08-06 LAB — LACTATE DEHYDROGENASE (CC13): LDH: 176 U/L (ref 125–245)

## 2015-08-08 LAB — BETA 2 MICROGLOBULIN, SERUM: Beta-2 Microglobulin: 2.73 mg/L — ABNORMAL HIGH (ref <=2.51)

## 2015-08-08 LAB — KAPPA/LAMBDA LIGHT CHAINS
KAPPA LAMBDA RATIO: 0.03 — AB (ref 0.26–1.65)
Kappa free light chain: 1.12 mg/dL (ref 0.33–1.94)
Lambda Free Lght Chn: 42.9 mg/dL — ABNORMAL HIGH (ref 0.57–2.63)

## 2015-08-08 LAB — IGG, IGA, IGM
IgA: 75 mg/dL (ref 69–380)
IgG (Immunoglobin G), Serum: 509 mg/dL — ABNORMAL LOW (ref 690–1700)
IgM, Serum: 18 mg/dL — ABNORMAL LOW (ref 52–322)

## 2015-08-13 ENCOUNTER — Ambulatory Visit: Payer: Medicare Other | Admitting: Internal Medicine

## 2015-08-13 ENCOUNTER — Other Ambulatory Visit: Payer: Medicare Other

## 2015-08-19 ENCOUNTER — Ambulatory Visit: Payer: Medicare Other | Admitting: Internal Medicine

## 2015-08-21 ENCOUNTER — Ambulatory Visit (HOSPITAL_BASED_OUTPATIENT_CLINIC_OR_DEPARTMENT_OTHER): Payer: Medicare Other | Admitting: Internal Medicine

## 2015-08-21 ENCOUNTER — Encounter: Payer: Self-pay | Admitting: Internal Medicine

## 2015-08-21 ENCOUNTER — Other Ambulatory Visit: Payer: Self-pay

## 2015-08-21 ENCOUNTER — Telehealth: Payer: Self-pay | Admitting: Internal Medicine

## 2015-08-21 VITALS — BP 126/64 | HR 74 | Temp 98.2°F | Resp 18 | Ht 63.0 in | Wt 118.6 lb

## 2015-08-21 DIAGNOSIS — D473 Essential (hemorrhagic) thrombocythemia: Secondary | ICD-10-CM

## 2015-08-21 DIAGNOSIS — D509 Iron deficiency anemia, unspecified: Secondary | ICD-10-CM

## 2015-08-21 DIAGNOSIS — E8809 Other disorders of plasma-protein metabolism, not elsewhere classified: Secondary | ICD-10-CM

## 2015-08-21 DIAGNOSIS — D472 Monoclonal gammopathy: Secondary | ICD-10-CM

## 2015-08-21 DIAGNOSIS — Z1231 Encounter for screening mammogram for malignant neoplasm of breast: Secondary | ICD-10-CM

## 2015-08-21 NOTE — Telephone Encounter (Signed)
per pof to sch pt appt-gave pt copy of avs °

## 2015-08-21 NOTE — Progress Notes (Signed)
[facility] Telephone:([phone]   Fax:(336) [place], [place] [address]  DIAGNOSIS:  1) plasma cell dyscrasia diagnosed in January 2011. 2) history of essential thrombocythemia  PRIOR THERAPY: The patient was treated in the past with anagrelide for essential thrombocythemia under the care of Dr. Jana Hakim.  CURRENT THERAPY: Observation.  INTERVAL HISTORY: Catherine Bullock 69 y.o. female returns to the clinic today for follow-up visit.  The patient is doing fine today with no specific complaints. She is very active and exercises at regular basis. She denied having any significant weight loss or night sweats. She has no nausea or vomiting, no fever or chills. She denied having any significant weight loss or night sweats. She had repeat myeloma panel performed recently and she is here for evaluation and discussion of her lab results.  MEDICAL HISTORY: Past Medical History  Diagnosis Date  . Iron deficiency anemia   . Thrombocytosis ([facility])   . Hypertension   . MVP (mitral valve prolapse)   . Anxiety   . Osteoporosis   . Degenerative disk disease   . IgG monoclonal gammopathy   . Colon adenoma   . GERD (gastroesophageal reflux disease)   . Epilepsy ([place])     last seizure 1987    ALLERGIES:  has No Known Allergies.  MEDICATIONS:  Current Outpatient Prescriptions  Medication Sig Dispense Refill  . Calcium-Vitamin D-Vitamin K 500-100-40 MG-UNT-MCG CHEW Chew 2 capsules by mouth daily.    Marland Kitchen docusate sodium (COLACE) 100 MG capsule Take 100 mg by mouth 2 (two) times daily.    Marland Kitchen escitalopram (LEXAPRO) 20 MG tablet Take 20 mg by mouth daily.    . ferrous sulfate 325 (65 FE) MG tablet Take 325 mg by mouth daily with breakfast.    . fish oil-omega-3 fatty acids 1000 MG capsule Take 1 g by mouth daily.    . Multiple Vitamin (MULTIVITAMIN) tablet Take 1 tablet by mouth daily.    .  Omeprazole Magnesium (PRILOSEC OTC PO) Take by mouth daily.    . phenytoin (DILANTIN) 100 MG ER capsule Take by mouth 2 (two) times daily. 2 capsules in the am and 1 capsule in the pm    . Probiotic Product (PROBIOTIC DAILY PO) Take by mouth daily.    . valsartan (DIOVAN) 160 MG tablet Take 160 mg by mouth daily.    . Coenzyme Q10 Liposomal 100 MG/ML LIQD Take by mouth daily.     No current facility-administered medications for this visit.    SURGICAL HISTORY:  Past Surgical History  Procedure Laterality Date  . Breast biopsy  1992    benign  . Tonsillectomy  1961    REVIEW OF SYSTEMS:  A comprehensive review of systems was negative.   PHYSICAL EXAMINATION: General appearance: alert, cooperative and no distress Head: Normocephalic, without obvious abnormality, atraumatic Neck: no adenopathy, no JVD, supple, symmetrical, trachea midline and thyroid not enlarged, symmetric, no tenderness/mass/nodules Lymph nodes: Cervical, supraclavicular, and axillary nodes normal. Resp: clear to auscultation bilaterally Back: symmetric, no curvature. ROM normal. No CVA tenderness. Cardio: regular rate and rhythm, S1, S2 normal, no murmur, click, rub or gallop GI: soft, non-tender; bowel sounds normal; no masses,  no organomegaly Extremities: extremities normal, atraumatic, no cyanosis or edema Neurologic: Alert and oriented X 3, normal strength and tone. Normal symmetric reflexes. Normal coordination and gait  ECOG PERFORMANCE STATUS: 0 - Asymptomatic  Blood pressure 126/64, pulse 74, temperature 98.2 F (36.8 C), temperature source Oral, resp. rate 18, height 5\' 3"  (1.6 m), weight 118 lb 9.6 oz (53.797 kg), SpO2 100 %.  LABORATORY DATA: Lab Results  Component Value Date   WBC 4.2 08/06/2015   HGB 13.2 08/06/2015   HCT 39.0 08/06/2015   MCV 92.7 08/06/2015   PLT 402* 08/06/2015      Chemistry      Component Value Date/Time   NA 141 08/06/2015 0758   NA 138 06/20/2012 0833   K 4.4  08/06/2015 0758   K 4.2 06/20/2012 0833   CL 99 12/25/2012 1323   CL 104 06/20/2012 0833   CO2 25 08/06/2015 0758   CO2 25 06/20/2012 0833   BUN 21.2 08/06/2015 0758   BUN 20 06/20/2012 0833   CREATININE 0.9 08/06/2015 0758   CREATININE 0.94 06/20/2012 0833   CREATININE 1.19 10/13/2009 0850      Component Value Date/Time   CALCIUM 9.4 08/06/2015 0758   CALCIUM 8.7 06/20/2012 0833   ALKPHOS 118 08/06/2015 0758   ALKPHOS 83 06/20/2012 0833   AST 25 08/06/2015 0758   AST 25 06/20/2012 0833   ALT 45 08/06/2015 0758   ALT 40* 06/20/2012 0833   BILITOT <0.30 08/06/2015 0758   BILITOT 0.3 06/20/2012 0833       RADIOGRAPHIC STUDIES: No results found.  ASSESSMENT AND PLAN:   This is a very pleasant 69 years old white female with history of plasma cell dyscrasia diagnosed in January 2011 has been on observation since that time with no active treatment.  The patient also has a history of essential thrombocythemia and currently on observation.  She is feeling fine today with no specific complaints.  Her myeloma panel showed increase in the free lambda light chain but the patient is still asymptomatic and no significant anemia or renal insufficiency. I recommended for her to continue on observation. I will see the patient back for follow-up visit in 6 months with repeat myeloma panel.  For the history of essential thrombocythemia, the patient will continue on observation.  The patient was advised to call immediately if she has any concerning symptoms in the interval. The patient voices understanding of current disease status and treatment options and is in agreement with the current care plan.  All questions were answered. The patient knows to call the clinic with any problems, questions or concerns. We can certainly see the patient much sooner if necessary.  Disclaimer: This note was dictated with voice recognition software. Similar sounding words can inadvertently be transcribed and may  not be corrected upon review.

## 2015-10-08 ENCOUNTER — Ambulatory Visit
Admission: RE | Admit: 2015-10-08 | Discharge: 2015-10-08 | Disposition: A | Discharge status: Home / Self Care | Payer: Medicare Other | Source: Ambulatory Visit

## 2015-10-08 DIAGNOSIS — Z1231 Encounter for screening mammogram for malignant neoplasm of breast: Secondary | ICD-10-CM | POA: Diagnosis not present

## 2015-11-14 DIAGNOSIS — Z13820 Encounter for screening for osteoporosis: Secondary | ICD-10-CM | POA: Diagnosis not present

## 2015-11-14 DIAGNOSIS — M816 Localized osteoporosis [Lequesne]: Secondary | ICD-10-CM | POA: Diagnosis not present

## 2015-11-14 DIAGNOSIS — Z124 Encounter for screening for malignant neoplasm of cervix: Secondary | ICD-10-CM | POA: Diagnosis not present

## 2015-11-14 DIAGNOSIS — N958 Other specified menopausal and perimenopausal disorders: Secondary | ICD-10-CM | POA: Diagnosis not present

## 2015-11-14 DIAGNOSIS — Z6821 Body mass index (BMI) 21.0-21.9, adult: Secondary | ICD-10-CM | POA: Diagnosis not present

## 2015-12-15 DIAGNOSIS — L821 Other seborrheic keratosis: Secondary | ICD-10-CM | POA: Diagnosis not present

## 2015-12-15 DIAGNOSIS — D225 Melanocytic nevi of trunk: Secondary | ICD-10-CM | POA: Diagnosis not present

## 2015-12-15 DIAGNOSIS — D2262 Melanocytic nevi of left upper limb, including shoulder: Secondary | ICD-10-CM | POA: Diagnosis not present

## 2015-12-15 DIAGNOSIS — L814 Other melanin hyperpigmentation: Secondary | ICD-10-CM | POA: Diagnosis not present

## 2015-12-15 DIAGNOSIS — D2271 Melanocytic nevi of right lower limb, including hip: Secondary | ICD-10-CM | POA: Diagnosis not present

## 2015-12-15 DIAGNOSIS — D2261 Melanocytic nevi of right upper limb, including shoulder: Secondary | ICD-10-CM | POA: Diagnosis not present

## 2015-12-25 DIAGNOSIS — I10 Essential (primary) hypertension: Secondary | ICD-10-CM | POA: Diagnosis not present

## 2015-12-25 DIAGNOSIS — F419 Anxiety disorder, unspecified: Secondary | ICD-10-CM | POA: Diagnosis not present

## 2015-12-25 DIAGNOSIS — G40909 Epilepsy, unspecified, not intractable, without status epilepticus: Secondary | ICD-10-CM | POA: Diagnosis not present

## 2015-12-25 DIAGNOSIS — D696 Thrombocytopenia, unspecified: Secondary | ICD-10-CM | POA: Diagnosis not present

## 2015-12-25 DIAGNOSIS — M810 Age-related osteoporosis without current pathological fracture: Secondary | ICD-10-CM | POA: Diagnosis not present

## 2015-12-25 DIAGNOSIS — E7800 Pure hypercholesterolemia, unspecified: Secondary | ICD-10-CM | POA: Diagnosis not present

## 2016-01-14 DIAGNOSIS — M810 Age-related osteoporosis without current pathological fracture: Secondary | ICD-10-CM | POA: Diagnosis not present

## 2016-02-13 ENCOUNTER — Other Ambulatory Visit (HOSPITAL_BASED_OUTPATIENT_CLINIC_OR_DEPARTMENT_OTHER): Payer: Medicare Other

## 2016-02-13 DIAGNOSIS — D509 Iron deficiency anemia, unspecified: Secondary | ICD-10-CM | POA: Diagnosis not present

## 2016-02-13 DIAGNOSIS — D472 Monoclonal gammopathy: Secondary | ICD-10-CM

## 2016-02-13 DIAGNOSIS — D473 Essential (hemorrhagic) thrombocythemia: Secondary | ICD-10-CM | POA: Diagnosis present

## 2016-02-13 LAB — CBC WITH DIFFERENTIAL/PLATELET
BASO%: 1.4 % (ref 0.0–2.0)
Basophils Absolute: 0.1 10*3/uL (ref 0.0–0.1)
EOS%: 3.8 % (ref 0.0–7.0)
Eosinophils Absolute: 0.2 10*3/uL (ref 0.0–0.5)
HEMATOCRIT: 36.2 % (ref 34.8–46.6)
HGB: 12 g/dL (ref 11.6–15.9)
LYMPH#: 1.9 10*3/uL (ref 0.9–3.3)
LYMPH%: 35.9 % (ref 14.0–49.7)
MCH: 30.8 pg (ref 25.1–34.0)
MCHC: 33.2 g/dL (ref 31.5–36.0)
MCV: 92.9 fL (ref 79.5–101.0)
MONO#: 0.7 10*3/uL (ref 0.1–0.9)
MONO%: 13 % (ref 0.0–14.0)
NEUT#: 2.4 10*3/uL (ref 1.5–6.5)
NEUT%: 45.9 % (ref 38.4–76.8)
Platelets: 413 10*3/uL — ABNORMAL HIGH (ref 145–400)
RBC: 3.9 10*6/uL (ref 3.70–5.45)
RDW: 14.9 % — ABNORMAL HIGH (ref 11.2–14.5)
WBC: 5.3 10*3/uL (ref 3.9–10.3)

## 2016-02-13 LAB — COMPREHENSIVE METABOLIC PANEL
ALBUMIN: 3.2 g/dL — AB (ref 3.5–5.0)
ALT: 46 U/L (ref 0–55)
AST: 24 U/L (ref 5–34)
Alkaline Phosphatase: 110 U/L (ref 40–150)
Anion Gap: 9 mEq/L (ref 3–11)
BUN: 30 mg/dL — AB (ref 7.0–26.0)
CALCIUM: 9 mg/dL (ref 8.4–10.4)
CO2: 25 mEq/L (ref 22–29)
Chloride: 105 mEq/L (ref 98–109)
Creatinine: 0.9 mg/dL (ref 0.6–1.1)
EGFR: 70 mL/min/{1.73_m2} — ABNORMAL LOW (ref >90)
GLUCOSE: 103 mg/dL (ref 70–140)
Potassium: 4.6 mEq/L (ref 3.5–5.1)
SODIUM: 139 meq/L (ref 136–145)
TOTAL PROTEIN: 6.3 g/dL — AB (ref 6.4–8.3)

## 2016-02-13 LAB — LACTATE DEHYDROGENASE: LDH: 156 U/L (ref 125–245)

## 2016-02-14 LAB — IGG, IGA, IGM
IGA/IMMUNOGLOBULIN A, SERUM: 67 mg/dL — AB (ref 87–352)
IGG (IMMUNOGLOBIN G), SERUM: 477 mg/dL — AB (ref 700–1600)
IGM (IMMUNOGLOBIN M), SRM: 17 mg/dL — AB (ref 26–217)

## 2016-02-14 LAB — BETA 2 MICROGLOBULIN, SERUM: BETA 2: 2.2 mg/L (ref 0.6–2.4)

## 2016-02-16 LAB — KAPPA/LAMBDA LIGHT CHAINS
Ig Kappa Free Light Chain: 10.51 mg/L (ref 3.30–19.40)
Ig Lambda Free Light Chain: 605.99 mg/L — ABNORMAL HIGH (ref 5.71–26.30)
Kappa/Lambda FluidC Ratio: 0.02 — ABNORMAL LOW (ref 0.26–1.65)

## 2016-02-19 ENCOUNTER — Ambulatory Visit (HOSPITAL_BASED_OUTPATIENT_CLINIC_OR_DEPARTMENT_OTHER): Payer: Medicare Other | Admitting: Internal Medicine

## 2016-02-19 ENCOUNTER — Encounter: Payer: Self-pay | Admitting: Internal Medicine

## 2016-02-19 ENCOUNTER — Telehealth: Payer: Self-pay | Admitting: Internal Medicine

## 2016-02-19 VITALS — BP 145/67 | HR 57 | Temp 98.1°F | Resp 18 | Ht 63.0 in | Wt 120.2 lb

## 2016-02-19 DIAGNOSIS — E8809 Other disorders of plasma-protein metabolism, not elsewhere classified: Secondary | ICD-10-CM

## 2016-02-19 DIAGNOSIS — D473 Essential (hemorrhagic) thrombocythemia: Secondary | ICD-10-CM

## 2016-02-19 DIAGNOSIS — D472 Monoclonal gammopathy: Secondary | ICD-10-CM

## 2016-02-19 NOTE — Progress Notes (Signed)
[facility] Telephone:([phone]   Fax:(336) [place], [place] [address]  DIAGNOSIS:  1) plasma cell dyscrasia diagnosed in January 2011. 2) history of essential thrombocythemia  PRIOR THERAPY: The patient was treated in the past with anagrelide for essential thrombocythemia under the care of Dr. Jana Hakim.  CURRENT THERAPY: Observation.  INTERVAL HISTORY: Catherine Bullock 70 y.o. female returns to the clinic today for 6 months follow-up visit accompanied by her husband.  The patient is doing fine today with no specific complaints. She just came back from a 10 days [facility]. She enjoyed her time. She denied having any significant weight loss or night sweats. She has no nausea or vomiting, no fever or chills. She denied having any significant weight loss or night sweats. She had a recent bone density assays that showed decrease in the bone density and she is expected to start annual treatment with Zometa by her primary care physician. She had repeat myeloma panel performed recently and she is here for evaluation and discussion of her lab results.  MEDICAL HISTORY: Past Medical History  Diagnosis Date  . Iron deficiency anemia   . Thrombocytosis ([facility])   . Hypertension   . MVP (mitral valve prolapse)   . Anxiety   . Osteoporosis   . Degenerative disk disease   . IgG monoclonal gammopathy   . Colon adenoma   . GERD (gastroesophageal reflux disease)   . Epilepsy ([facility])     last seizure 1987    ALLERGIES:  has No Known Allergies.  MEDICATIONS:  Current Outpatient Prescriptions  Medication Sig Dispense Refill  . Calcium-Vitamin D-Vitamin K 500-100-40 MG-UNT-MCG CHEW Chew 2 capsules by mouth daily.    . Coenzyme Q10 Liposomal 100 MG/ML LIQD Take by mouth daily.    Marland Kitchen docusate sodium (COLACE) 100 MG capsule Take 100 mg by mouth 2 (two) times daily.    Marland Kitchen escitalopram  (LEXAPRO) 20 MG tablet Take 20 mg by mouth daily.    . ferrous sulfate 325 (65 FE) MG tablet Take 325 mg by mouth daily with breakfast.    . fish oil-omega-3 fatty acids 1000 MG capsule Take 1 g by mouth daily.    . Multiple Vitamin (MULTIVITAMIN) tablet Take 1 tablet by mouth daily.    . Omeprazole Magnesium (PRILOSEC OTC PO) Take by mouth daily.    . phenytoin (DILANTIN) 100 MG ER capsule Take by mouth 2 (two) times daily. 2 capsules in the am and 1 capsule in the pm    . Probiotic Product (PROBIOTIC DAILY PO) Take by mouth daily.    . valsartan (DIOVAN) 160 MG tablet Take 160 mg by mouth daily.     No current facility-administered medications for this visit.    SURGICAL HISTORY:  Past Surgical History  Procedure Laterality Date  . Breast biopsy  1992    benign  . Tonsillectomy  1961    REVIEW OF SYSTEMS:  A comprehensive review of systems was negative.   PHYSICAL EXAMINATION: General appearance: alert, cooperative and no distress Head: Normocephalic, without obvious abnormality, atraumatic Neck: no adenopathy, no JVD, supple, symmetrical, trachea midline and thyroid not enlarged, symmetric, no tenderness/mass/nodules Lymph nodes: Cervical, supraclavicular, and axillary nodes normal. Resp: clear to auscultation bilaterally Back: symmetric, no curvature. ROM normal. No CVA tenderness. Cardio: regular rate and rhythm, S1, S2 normal, no murmur, click, rub or gallop GI: soft, non-tender;  bowel sounds normal; no masses,  no organomegaly Extremities: extremities normal, atraumatic, no cyanosis or edema Neurologic: Alert and oriented X 3, normal strength and tone. Normal symmetric reflexes. Normal coordination and gait  ECOG PERFORMANCE STATUS: 0 - Asymptomatic  There were no vitals taken for this visit.  LABORATORY DATA: Lab Results  Component Value Date   WBC 5.3 02/13/2016   HGB 12.0 02/13/2016   HCT 36.2 02/13/2016   MCV 92.9 02/13/2016   PLT 413* 02/13/2016       Chemistry      Component Value Date/Time   NA 139 02/13/2016 0820   NA 138 06/20/2012 0833   K 4.6 02/13/2016 0820   K 4.2 06/20/2012 0833   CL 99 12/25/2012 1323   CL 104 06/20/2012 0833   CO2 25 02/13/2016 0820   CO2 25 06/20/2012 0833   BUN 30.0* 02/13/2016 0820   BUN 20 06/20/2012 0833   CREATININE 0.9 02/13/2016 0820   CREATININE 0.94 06/20/2012 0833   CREATININE 1.19 10/13/2009 0850      Component Value Date/Time   CALCIUM 9.0 02/13/2016 0820   CALCIUM 8.7 06/20/2012 0833   ALKPHOS 110 02/13/2016 0820   ALKPHOS 83 06/20/2012 0833   AST 24 02/13/2016 0820   AST 25 06/20/2012 0833   ALT 46 02/13/2016 0820   ALT 40* 06/20/2012 0833   BILITOT <0.30 02/13/2016 0820   BILITOT 0.3 06/20/2012 0833     Myeloma Panel: Beta-2 microglobulin 2.2, free Kappa light chain 10.51, free lambda light chain 605.99,Kappa/lambda ratio 0.02. IgG 477, IgA 67 and IgM 17.  RADIOGRAPHIC STUDIES: No results found.  ASSESSMENT AND PLAN:   This is a very pleasant 70 years old white female with history of plasma cell dyscrasia diagnosed in January 2011 and has been on observation since that time with no active treatment.  The patient also has a history of essential thrombocythemia and currently on observation.  She is feeling fine today with no specific complaints.  Her myeloma panel showed further increase in the free lambda light chain but the patient is still asymptomatic and no significant anemia or renal insufficiency. I recommended for her to continue on observation. I will see the patient back for follow-up visit in 6 months with repeat myeloma panel.  For the history of essential thrombocythemia, the patient will continue on observation.  The patient was advised to call immediately if she has any concerning symptoms in the interval. The patient voices understanding of current disease status and treatment options and is in agreement with the current care plan.  All questions were answered.  The patient knows to call the clinic with any problems, questions or concerns. We can certainly see the patient much sooner if necessary.  Disclaimer: This note was dictated with voice recognition software. Similar sounding words can inadvertently be transcribed and may not be corrected upon review.

## 2016-02-19 NOTE — Telephone Encounter (Signed)
Gave and printed appt sched and avs fo rpt for OCT °

## 2016-03-11 ENCOUNTER — Other Ambulatory Visit (HOSPITAL_COMMUNITY): Payer: Self-pay | Admitting: *Deleted

## 2016-03-12 ENCOUNTER — Ambulatory Visit (HOSPITAL_COMMUNITY)
Admission: RE | Admit: 2016-03-12 | Discharge: 2016-03-12 | Disposition: A | Discharge status: Home / Self Care | Payer: Medicare Other | Source: Ambulatory Visit | Attending: Obstetrics and Gynecology | Admitting: Obstetrics and Gynecology

## 2016-03-12 DIAGNOSIS — M810 Age-related osteoporosis without current pathological fracture: Secondary | ICD-10-CM | POA: Insufficient documentation

## 2016-03-12 MED ORDER — ZOLEDRONIC ACID 5 MG/100ML IV SOLN
5.0000 mg | Freq: Once | INTRAVENOUS | Status: AC
  Administered 2016-03-12: 5 mg via INTRAVENOUS

## 2016-03-12 MED ORDER — ZOLEDRONIC ACID 5 MG/100ML IV SOLN
INTRAVENOUS | Status: AC
  Filled 2016-03-12: qty 100

## 2016-03-12 NOTE — Discharge Instructions (Signed)

## 2016-03-18 ENCOUNTER — Encounter (HOSPITAL_COMMUNITY): Payer: Medicare Other

## 2016-04-06 DIAGNOSIS — M76892 Other specified enthesopathies of left lower limb, excluding foot: Secondary | ICD-10-CM | POA: Diagnosis not present

## 2016-04-06 DIAGNOSIS — M25551 Pain in right hip: Secondary | ICD-10-CM | POA: Diagnosis not present

## 2016-04-23 DIAGNOSIS — M25551 Pain in right hip: Secondary | ICD-10-CM | POA: Diagnosis not present

## 2016-04-23 DIAGNOSIS — M25561 Pain in right knee: Secondary | ICD-10-CM | POA: Diagnosis not present

## 2016-04-23 DIAGNOSIS — R531 Weakness: Secondary | ICD-10-CM | POA: Diagnosis not present

## 2016-04-23 DIAGNOSIS — M25562 Pain in left knee: Secondary | ICD-10-CM | POA: Diagnosis not present

## 2016-04-26 DIAGNOSIS — M25551 Pain in right hip: Secondary | ICD-10-CM | POA: Diagnosis not present

## 2016-04-26 DIAGNOSIS — M25562 Pain in left knee: Secondary | ICD-10-CM | POA: Diagnosis not present

## 2016-04-26 DIAGNOSIS — M25561 Pain in right knee: Secondary | ICD-10-CM | POA: Diagnosis not present

## 2016-04-26 DIAGNOSIS — R531 Weakness: Secondary | ICD-10-CM | POA: Diagnosis not present

## 2016-05-10 DIAGNOSIS — M25562 Pain in left knee: Secondary | ICD-10-CM | POA: Diagnosis not present

## 2016-05-10 DIAGNOSIS — M25561 Pain in right knee: Secondary | ICD-10-CM | POA: Diagnosis not present

## 2016-05-10 DIAGNOSIS — M25551 Pain in right hip: Secondary | ICD-10-CM | POA: Diagnosis not present

## 2016-05-10 DIAGNOSIS — R531 Weakness: Secondary | ICD-10-CM | POA: Diagnosis not present

## 2016-05-17 DIAGNOSIS — R531 Weakness: Secondary | ICD-10-CM | POA: Diagnosis not present

## 2016-05-17 DIAGNOSIS — M25561 Pain in right knee: Secondary | ICD-10-CM | POA: Diagnosis not present

## 2016-05-17 DIAGNOSIS — M25562 Pain in left knee: Secondary | ICD-10-CM | POA: Diagnosis not present

## 2016-05-17 DIAGNOSIS — M25551 Pain in right hip: Secondary | ICD-10-CM | POA: Diagnosis not present

## 2016-06-01 DIAGNOSIS — M25561 Pain in right knee: Secondary | ICD-10-CM | POA: Diagnosis not present

## 2016-06-01 DIAGNOSIS — M25551 Pain in right hip: Secondary | ICD-10-CM | POA: Diagnosis not present

## 2016-06-01 DIAGNOSIS — R531 Weakness: Secondary | ICD-10-CM | POA: Diagnosis not present

## 2016-06-01 DIAGNOSIS — M25562 Pain in left knee: Secondary | ICD-10-CM | POA: Diagnosis not present

## 2016-06-14 DIAGNOSIS — M25562 Pain in left knee: Secondary | ICD-10-CM | POA: Diagnosis not present

## 2016-06-14 DIAGNOSIS — M25561 Pain in right knee: Secondary | ICD-10-CM | POA: Diagnosis not present

## 2016-06-14 DIAGNOSIS — R531 Weakness: Secondary | ICD-10-CM | POA: Diagnosis not present

## 2016-06-14 DIAGNOSIS — M25551 Pain in right hip: Secondary | ICD-10-CM | POA: Diagnosis not present

## 2016-06-23 ENCOUNTER — Other Ambulatory Visit: Payer: Self-pay | Admitting: Family Medicine

## 2016-06-23 ENCOUNTER — Ambulatory Visit
Admission: RE | Admit: 2016-06-23 | Discharge: 2016-06-23 | Disposition: A | Discharge status: Home / Self Care | Payer: Medicare Other | Source: Ambulatory Visit | Attending: Family Medicine | Admitting: Family Medicine

## 2016-06-23 DIAGNOSIS — M7052 Other bursitis of knee, left knee: Secondary | ICD-10-CM | POA: Diagnosis not present

## 2016-06-23 DIAGNOSIS — M47816 Spondylosis without myelopathy or radiculopathy, lumbar region: Secondary | ICD-10-CM | POA: Diagnosis not present

## 2016-06-23 DIAGNOSIS — M545 Low back pain, unspecified: Secondary | ICD-10-CM

## 2016-06-28 DIAGNOSIS — M25551 Pain in right hip: Secondary | ICD-10-CM | POA: Diagnosis not present

## 2016-06-28 DIAGNOSIS — M25562 Pain in left knee: Secondary | ICD-10-CM | POA: Diagnosis not present

## 2016-06-28 DIAGNOSIS — M25561 Pain in right knee: Secondary | ICD-10-CM | POA: Diagnosis not present

## 2016-06-28 DIAGNOSIS — R531 Weakness: Secondary | ICD-10-CM | POA: Diagnosis not present

## 2016-07-02 DIAGNOSIS — R531 Weakness: Secondary | ICD-10-CM | POA: Diagnosis not present

## 2016-07-02 DIAGNOSIS — M25551 Pain in right hip: Secondary | ICD-10-CM | POA: Diagnosis not present

## 2016-07-02 DIAGNOSIS — M25562 Pain in left knee: Secondary | ICD-10-CM | POA: Diagnosis not present

## 2016-07-02 DIAGNOSIS — M25561 Pain in right knee: Secondary | ICD-10-CM | POA: Diagnosis not present

## 2016-07-07 DIAGNOSIS — M25561 Pain in right knee: Secondary | ICD-10-CM | POA: Diagnosis not present

## 2016-07-07 DIAGNOSIS — M25551 Pain in right hip: Secondary | ICD-10-CM | POA: Diagnosis not present

## 2016-07-07 DIAGNOSIS — M25562 Pain in left knee: Secondary | ICD-10-CM | POA: Diagnosis not present

## 2016-07-07 DIAGNOSIS — R531 Weakness: Secondary | ICD-10-CM | POA: Diagnosis not present

## 2016-07-09 DIAGNOSIS — M25562 Pain in left knee: Secondary | ICD-10-CM | POA: Diagnosis not present

## 2016-07-09 DIAGNOSIS — M25551 Pain in right hip: Secondary | ICD-10-CM | POA: Diagnosis not present

## 2016-07-09 DIAGNOSIS — M25561 Pain in right knee: Secondary | ICD-10-CM | POA: Diagnosis not present

## 2016-07-09 DIAGNOSIS — R531 Weakness: Secondary | ICD-10-CM | POA: Diagnosis not present

## 2016-07-12 DIAGNOSIS — M25551 Pain in right hip: Secondary | ICD-10-CM | POA: Diagnosis not present

## 2016-07-12 DIAGNOSIS — M25562 Pain in left knee: Secondary | ICD-10-CM | POA: Diagnosis not present

## 2016-07-12 DIAGNOSIS — R531 Weakness: Secondary | ICD-10-CM | POA: Diagnosis not present

## 2016-07-12 DIAGNOSIS — M25561 Pain in right knee: Secondary | ICD-10-CM | POA: Diagnosis not present

## 2016-07-15 DIAGNOSIS — M25562 Pain in left knee: Secondary | ICD-10-CM | POA: Diagnosis not present

## 2016-07-15 DIAGNOSIS — R531 Weakness: Secondary | ICD-10-CM | POA: Diagnosis not present

## 2016-07-15 DIAGNOSIS — M25561 Pain in right knee: Secondary | ICD-10-CM | POA: Diagnosis not present

## 2016-07-15 DIAGNOSIS — M25551 Pain in right hip: Secondary | ICD-10-CM | POA: Diagnosis not present

## 2016-07-19 DIAGNOSIS — R531 Weakness: Secondary | ICD-10-CM | POA: Diagnosis not present

## 2016-07-19 DIAGNOSIS — M25561 Pain in right knee: Secondary | ICD-10-CM | POA: Diagnosis not present

## 2016-07-19 DIAGNOSIS — M25551 Pain in right hip: Secondary | ICD-10-CM | POA: Diagnosis not present

## 2016-07-19 DIAGNOSIS — M25562 Pain in left knee: Secondary | ICD-10-CM | POA: Diagnosis not present

## 2016-07-29 DIAGNOSIS — M25562 Pain in left knee: Secondary | ICD-10-CM | POA: Diagnosis not present

## 2016-07-29 DIAGNOSIS — M25561 Pain in right knee: Secondary | ICD-10-CM | POA: Diagnosis not present

## 2016-07-29 DIAGNOSIS — M25551 Pain in right hip: Secondary | ICD-10-CM | POA: Diagnosis not present

## 2016-07-29 DIAGNOSIS — R531 Weakness: Secondary | ICD-10-CM | POA: Diagnosis not present

## 2016-08-02 ENCOUNTER — Telehealth: Payer: Self-pay | Admitting: Internal Medicine

## 2016-08-02 NOTE — Telephone Encounter (Signed)
10/19 APPOINTMENT RESCHEDULED PER PATIENT REQUEST TO 10/18. PATIENT WILL BE OUT OF TOWN 10/19-10/20.

## 2016-08-03 DIAGNOSIS — M25562 Pain in left knee: Secondary | ICD-10-CM | POA: Diagnosis not present

## 2016-08-03 DIAGNOSIS — I10 Essential (primary) hypertension: Secondary | ICD-10-CM | POA: Diagnosis not present

## 2016-08-03 DIAGNOSIS — Z23 Encounter for immunization: Secondary | ICD-10-CM | POA: Diagnosis not present

## 2016-08-04 DIAGNOSIS — M25551 Pain in right hip: Secondary | ICD-10-CM | POA: Diagnosis not present

## 2016-08-04 DIAGNOSIS — M25562 Pain in left knee: Secondary | ICD-10-CM | POA: Diagnosis not present

## 2016-08-04 DIAGNOSIS — M25561 Pain in right knee: Secondary | ICD-10-CM | POA: Diagnosis not present

## 2016-08-04 DIAGNOSIS — R531 Weakness: Secondary | ICD-10-CM | POA: Diagnosis not present

## 2016-08-09 DIAGNOSIS — M25561 Pain in right knee: Secondary | ICD-10-CM | POA: Diagnosis not present

## 2016-08-09 DIAGNOSIS — R531 Weakness: Secondary | ICD-10-CM | POA: Diagnosis not present

## 2016-08-09 DIAGNOSIS — M25551 Pain in right hip: Secondary | ICD-10-CM | POA: Diagnosis not present

## 2016-08-09 DIAGNOSIS — M25562 Pain in left knee: Secondary | ICD-10-CM | POA: Diagnosis not present

## 2016-08-11 DIAGNOSIS — J209 Acute bronchitis, unspecified: Secondary | ICD-10-CM | POA: Diagnosis not present

## 2016-08-12 ENCOUNTER — Other Ambulatory Visit (HOSPITAL_BASED_OUTPATIENT_CLINIC_OR_DEPARTMENT_OTHER): Payer: Medicare Other

## 2016-08-12 ENCOUNTER — Encounter: Payer: Self-pay | Admitting: Sports Medicine

## 2016-08-12 ENCOUNTER — Ambulatory Visit (INDEPENDENT_AMBULATORY_CARE_PROVIDER_SITE_OTHER): Payer: Medicare Other | Admitting: Sports Medicine

## 2016-08-12 ENCOUNTER — Ambulatory Visit
Admission: RE | Admit: 2016-08-12 | Discharge: 2016-08-12 | Disposition: A | Discharge status: Home / Self Care | Payer: Medicare Other | Source: Ambulatory Visit | Attending: Sports Medicine | Admitting: Sports Medicine

## 2016-08-12 ENCOUNTER — Other Ambulatory Visit: Payer: Self-pay | Admitting: *Deleted

## 2016-08-12 VITALS — BP 128/64 | HR 54 | Ht 63.0 in | Wt 121.0 lb

## 2016-08-12 DIAGNOSIS — M25562 Pain in left knee: Secondary | ICD-10-CM

## 2016-08-12 DIAGNOSIS — D472 Monoclonal gammopathy: Secondary | ICD-10-CM | POA: Diagnosis not present

## 2016-08-12 DIAGNOSIS — M5441 Lumbago with sciatica, right side: Secondary | ICD-10-CM | POA: Diagnosis not present

## 2016-08-12 DIAGNOSIS — M179 Osteoarthritis of knee, unspecified: Secondary | ICD-10-CM | POA: Diagnosis not present

## 2016-08-12 LAB — CBC WITH DIFFERENTIAL/PLATELET
BASO%: 1.4 % (ref 0.0–2.0)
Basophils Absolute: 0.1 10*3/uL (ref 0.0–0.1)
EOS%: 5.3 % (ref 0.0–7.0)
Eosinophils Absolute: 0.3 10*3/uL (ref 0.0–0.5)
HCT: 36.4 % (ref 34.8–46.6)
HEMOGLOBIN: 12.3 g/dL (ref 11.6–15.9)
LYMPH%: 41.6 % (ref 14.0–49.7)
MCH: 30.8 pg (ref 25.1–34.0)
MCHC: 33.8 g/dL (ref 31.5–36.0)
MCV: 91.2 fL (ref 79.5–101.0)
MONO#: 0.7 10*3/uL (ref 0.1–0.9)
MONO%: 14.2 % — ABNORMAL HIGH (ref 0.0–14.0)
NEUT%: 37.5 % — ABNORMAL LOW (ref 38.4–76.8)
NEUTROS ABS: 1.9 10*3/uL (ref 1.5–6.5)
PLATELETS: 347 10*3/uL (ref 145–400)
RBC: 3.99 10*6/uL (ref 3.70–5.45)
RDW: 14.4 % (ref 11.2–14.5)
WBC: 5.1 10*3/uL (ref 3.9–10.3)
lymph#: 2.1 10*3/uL (ref 0.9–3.3)

## 2016-08-12 LAB — COMPREHENSIVE METABOLIC PANEL
ALBUMIN: 3.2 g/dL — AB (ref 3.5–5.0)
ALK PHOS: 75 U/L (ref 40–150)
ALT: 56 U/L — ABNORMAL HIGH (ref 0–55)
ANION GAP: 9 meq/L (ref 3–11)
AST: 40 U/L — ABNORMAL HIGH (ref 5–34)
BILIRUBIN TOTAL: 0.27 mg/dL (ref 0.20–1.20)
BUN: 23.2 mg/dL (ref 7.0–26.0)
CO2: 25 mEq/L (ref 22–29)
Calcium: 9.2 mg/dL (ref 8.4–10.4)
Chloride: 102 mEq/L (ref 98–109)
Creatinine: 0.9 mg/dL (ref 0.6–1.1)
EGFR: 66 mL/min/{1.73_m2} — AB (ref >90)
Glucose: 107 mg/dl (ref 70–140)
Potassium: 4.4 mEq/L (ref 3.5–5.1)
Sodium: 136 mEq/L (ref 136–145)
TOTAL PROTEIN: 6.7 g/dL (ref 6.4–8.3)

## 2016-08-12 LAB — LACTATE DEHYDROGENASE: LDH: 209 U/L (ref 125–245)

## 2016-08-13 ENCOUNTER — Telehealth: Payer: Self-pay | Admitting: *Deleted

## 2016-08-13 LAB — IGG, IGA, IGM
IgA, Qn, Serum: 63 mg/dL — ABNORMAL LOW (ref 87–352)
IgG, Qn, Serum: 446 mg/dL — ABNORMAL LOW (ref 700–1600)
IgM, Qn, Serum: 29 mg/dL (ref 26–217)

## 2016-08-13 LAB — BETA 2 MICROGLOBULIN, SERUM: BETA 2: 2.9 mg/L — AB (ref 0.6–2.4)

## 2016-08-13 LAB — KAPPA/LAMBDA LIGHT CHAINS
Ig Kappa Free Light Chain: 10.8 mg/L (ref 3.3–19.4)
Ig Lambda Free Light Chain: 987.9 mg/L — ABNORMAL HIGH (ref 5.7–26.3)
KAPPA/LAMBDA FLC RATIO: 0.01 — AB (ref 0.26–1.65)

## 2016-08-13 NOTE — Progress Notes (Signed)
Subjective:    Patient ID: Catherine Bullock, female    DOB: 1946-03-18, 70 y.o.   MRN: [id number]  HPI chief complaint: Right hip and left knee pain  Very pleasant 70 year old female comes in today with a couple of different complaints. First, she is complaining of posterior right hip pain. She has been seeing Barbaraann Barthel and getting treatment for piriformis syndrome. Although this has been helpful it has not been curative. She describes pain in the posterior hip which is worse with walking as well as with laying flat on her back. Pain will occasionally radiate down the leg but this is infrequent. She does have a history of a bulging disc in her lumbar spine which was treated by Dr. Carloyn Manner with an epidural injection many years ago. This improved her symptoms at that time. Her current pain is similar in nature to what she experienced at that time but she is not getting the constant radiating pain into the right leg that she was experiencing previously . She denies any groin pain. No trauma. No numbness or tingling. She has not noticed any weakness in either leg. Second, she is complaining of medial sided left knee pain. Barbaraann Barthel has been treating this as well. He has tried some iontophoresis which has been minimally helpful. Her pain is diffuse along the medial knee. She has not noticed any swelling. Worse with activity. In addition to physical therapy she has also tried oral prednisone. She is currently trying to strengthen her knee. She denies any prior knee surgery. No locking, catching, or popping.    Review of Systems As above    Objective:   Physical Exam  Well-developed, fit appearing. No acute distress. Awake alert and oriented 3. Vital signs reviewed  Right hip: Smooth painless hip range of motion with a negative log roll. Some tenderness to palpation along the posterior hip diffusely. No tenderness to palpation over the greater trochanteric bursa. No pain with resisted hip  flexion.  Left knee: Full range of motion. No effusion. Patient is tender to palpation along the pes anserine bursa but is also tender to palpation along the medial joint line. 1+ patellofemoral crepitus. Knee is stable to valgus and varus stressing. Negative anterior drawer, negative posterior drawer. Negative Thessaly's.  Neurological exam: Patient has 4+/5 strength with resisted great toe extension on the right compared to 5/5 on the left. Remainder of her strength is 5/5 in both lower extremities. Reflexes are equal at both the Achilles and patellar tendons bilaterally.  Good dorsalis pedis and posterior tibial pulses  Patient walks without a limp.       Assessment & Plan:   Posterior right hip pain likely referred from her lumbar spine Left knee pain secondary to pes anserine bursitis versus medial compartmental DJD  Since the patient has had several months of physical therapy (a total of 16 visits), I would like to get an MRI of her lumbar spine specifically to rule out a bulging lumbar disc which may explain her ongoing posterior right hip pain. She did have x-rays done at her PCPs office which were unremarkable per her report. I would also like to get x-rays of her left knee. She will follow-up with me in the office after those studies are done to discuss those results and delineate further treatment. I may consider a diagnostic/therapeutic intra-articular injection into the left knee if her x-rays show arthritis. We may also consider a diagnostic/therapeutic lumbar ESI if her MRI shows discogenic pathology that  may be responsible for her hip pain.

## 2016-08-13 NOTE — Telephone Encounter (Signed)
Patient called about knee xray.  Gave her the results and will notify Dr. Micheline Chapman if there are any other comments. Has lumbar MRI on Oct 23rd.

## 2016-08-18 ENCOUNTER — Telehealth: Payer: Self-pay | Admitting: Internal Medicine

## 2016-08-18 ENCOUNTER — Ambulatory Visit (HOSPITAL_BASED_OUTPATIENT_CLINIC_OR_DEPARTMENT_OTHER): Payer: Medicare Other | Admitting: Internal Medicine

## 2016-08-18 ENCOUNTER — Encounter: Payer: Self-pay | Admitting: Internal Medicine

## 2016-08-18 VITALS — BP 133/82 | HR 64 | Temp 98.0°F | Resp 18 | Ht 63.0 in | Wt 121.0 lb

## 2016-08-18 DIAGNOSIS — D472 Monoclonal gammopathy: Secondary | ICD-10-CM

## 2016-08-18 DIAGNOSIS — Z8579 Personal history of other malignant neoplasms of lymphoid, hematopoietic and related tissues: Secondary | ICD-10-CM | POA: Diagnosis not present

## 2016-08-18 NOTE — Telephone Encounter (Signed)
AVS report and appointment schedule given to patient, per 08/18/16 los.

## 2016-08-18 NOTE — Progress Notes (Signed)
[facility] Telephone:([phone]   Fax:(336) [place], [place] [address]  DIAGNOSIS:  1) plasma cell dyscrasia diagnosed in January 2011. 2) history of essential thrombocythemia  PRIOR THERAPY: The patient was treated in the past with anagrelide for essential thrombocythemia under the care of Dr. Jana Hakim.  CURRENT THERAPY: Observation.  INTERVAL HISTORY: Catherine Bullock 70 y.o. female returns to the clinic today for 6 months follow-up visit accompanied by her husband. The patient is doing fine today with no specific complaints except for recent bronchitis. She also has back pain and she is expected to have MRI of the lumbar spine for evaluation of her condition soon. She enjoyed her time. She denied having any significant weight loss or night sweats. She has no nausea or vomiting, no fever or chills. She denied having any significant weight loss or night sweats. She had repeat myeloma panel performed recently and she is here for evaluation and discussion of her lab results.  MEDICAL HISTORY: Past Medical History:  Diagnosis Date  . Anxiety   . Colon adenoma   . Degenerative disk disease   . Epilepsy ([facility])    last seizure 1987  . GERD (gastroesophageal reflux disease)   . Hypertension   . IgG monoclonal gammopathy   . Iron deficiency anemia   . MVP (mitral valve prolapse)   . Osteoporosis   . Thrombocytosis ([facility])     ALLERGIES:  has No Known Allergies.  MEDICATIONS:  Current Outpatient Prescriptions  Medication Sig Dispense Refill  . ALPRAZolam (XANAX) 0.25 MG tablet Take 0.25 mg by mouth every 8 (eight) hours as needed.  1  . azithromycin (ZITHROMAX) 250 MG tablet     . Calcium-Vitamin D-Vitamin K 500-100-40 MG-UNT-MCG CHEW Chew 2 capsules by mouth daily.    . Coenzyme Q10 Liposomal 100 MG/ML LIQD Take by mouth daily.    Marland Kitchen Dexamethasone Sodium Phosphate (DECADRON) 20  MG/5ML injection USE UTD BY PHYSICAL THERAPIST  2  . docusate sodium (COLACE) 100 MG capsule Take 100 mg by mouth 2 (two) times daily.    Marland Kitchen escitalopram (LEXAPRO) 20 MG tablet Take 20 mg by mouth daily.    . ferrous sulfate 325 (65 FE) MG tablet Take 325 mg by mouth daily with breakfast.    . fish oil-omega-3 fatty acids 1000 MG capsule Take 1 g by mouth daily.    Marland Kitchen HYDROcodone-acetaminophen (NORCO) 10-325 MG tablet     . HYDROMET 5-1.5 MG/5ML syrup     . Multiple Vitamin (MULTIVITAMIN) tablet Take 1 tablet by mouth daily.    . Omeprazole Magnesium (PRILOSEC OTC PO) Take by mouth daily.    . phenytoin (DILANTIN) 100 MG ER capsule Take by mouth 2 (two) times daily. 2 capsules in the am and 1 capsule in the pm    . Probiotic Product (PROBIOTIC DAILY PO) Take by mouth daily.    Marland Kitchen tretinoin (RETIN-A) 0.1 % cream Apply 1 application topically daily.  4  . valsartan (DIOVAN) 80 MG tablet Take 80 mg by mouth daily.  1   No current facility-administered medications for this visit.     SURGICAL HISTORY:  Past Surgical History:  Procedure Laterality Date  . BREAST BIOPSY  1992   benign  . TONSILLECTOMY  1961    REVIEW OF SYSTEMS:  A comprehensive review of systems was negative except for: Respiratory: positive for cough Musculoskeletal: positive for  back pain   PHYSICAL EXAMINATION: General appearance: alert, cooperative and no distress Head: Normocephalic, without obvious abnormality, atraumatic Neck: no adenopathy, no JVD, supple, symmetrical, trachea midline and thyroid not enlarged, symmetric, no tenderness/mass/nodules Lymph nodes: Cervical, supraclavicular, and axillary nodes normal. Resp: clear to auscultation bilaterally Back: symmetric, no curvature. ROM normal. No CVA tenderness. Cardio: regular rate and rhythm, S1, S2 normal, no murmur, click, rub or gallop GI: soft, non-tender; bowel sounds normal; no masses,  no organomegaly Extremities: extremities normal, atraumatic, no  cyanosis or edema Neurologic: Alert and oriented X 3, normal strength and tone. Normal symmetric reflexes. Normal coordination and gait  ECOG PERFORMANCE STATUS: 0 - Asymptomatic  Blood pressure 133/82, pulse 64, temperature 98 F (36.7 C), temperature source Oral, resp. rate 18, height '5\' 3"'$  (1.6 m), weight 121 lb (54.9 kg), SpO2 100 %.  LABORATORY DATA: Lab Results  Component Value Date   WBC 5.1 08/12/2016   HGB 12.3 08/12/2016   HCT 36.4 08/12/2016   MCV 91.2 08/12/2016   PLT 347 08/12/2016      Chemistry      Component Value Date/Time   NA 136 08/12/2016 0825   K 4.4 08/12/2016 0825   CL 99 12/25/2012 1323   CO2 25 08/12/2016 0825   BUN 23.2 08/12/2016 0825   CREATININE 0.9 08/12/2016 0825      Component Value Date/Time   CALCIUM 9.2 08/12/2016 0825   ALKPHOS 75 08/12/2016 0825   AST 40 (H) 08/12/2016 0825   ALT 56 (H) 08/12/2016 0825   BILITOT 0.27 08/12/2016 0825     Myeloma Panel: Beta-2 microglobulin 2.9, free Kappa light chain 10.8, free lambda light chain 987.90,Kappa/lambda ratio 0.01. IgG 446, IgA 63 and IgM 29.  RADIOGRAPHIC STUDIES: Dg Knee Complete 4 Views Left  Result Date: 08/12/2016 CLINICAL DATA:  Medial left knee pain EXAM: LEFT KNEE - COMPLETE 4+ VIEW COMPARISON:  None. FINDINGS: Four views of the left knee submitted. No acute fracture or subluxation. Mild narrowing of medial joint compartment. No joint effusion. No periosteal reaction or bony erosion. Mild narrowing of patellofemoral joint space. IMPRESSION: No acute fracture or subluxation.  Mild degenerative changes. Electronically Signed   By: Lahoma Crocker M.D.   On: 08/12/2016 13:51    ASSESSMENT AND PLAN:   This is a very pleasant 70 years old white female with history of plasma cell dyscrasia diagnosed in January 2011 and has been on observation since that time with no active treatment.  The patient also has a history of essential thrombocythemia and currently on observation.  She is feeling  fine today with no specific complaints.  Her myeloma panel showed significant increase in the free lambda light chain. I recommended for the patient to have a repeat CT guided bone marrow biopsy and aspirate for further evaluation of her condition. I will see her back for follow-up visit in 3 weeks for evaluation and discussion of the biopsy results and further recommendation regarding treatment of her condition.  The patient was advised to call immediately if she has any concerning symptoms in the interval. The patient voices understanding of current disease status and treatment options and is in agreement with the current care plan.  All questions were answered. The patient knows to call the clinic with any problems, questions or concerns. We can certainly see the patient much sooner if necessary.  Disclaimer: This note was dictated with voice recognition software. Similar sounding words can inadvertently be transcribed and may not be corrected upon  review.

## 2016-08-19 ENCOUNTER — Ambulatory Visit: Payer: Medicare Other | Admitting: Internal Medicine

## 2016-08-23 ENCOUNTER — Ambulatory Visit
Admission: RE | Admit: 2016-08-23 | Discharge: 2016-08-23 | Disposition: A | Discharge status: Home / Self Care | Payer: Medicare Other | Source: Ambulatory Visit | Attending: Sports Medicine | Admitting: Sports Medicine

## 2016-08-23 DIAGNOSIS — M5126 Other intervertebral disc displacement, lumbar region: Secondary | ICD-10-CM | POA: Diagnosis not present

## 2016-08-23 DIAGNOSIS — M5441 Lumbago with sciatica, right side: Secondary | ICD-10-CM

## 2016-08-24 ENCOUNTER — Other Ambulatory Visit: Payer: Self-pay | Admitting: Radiology

## 2016-08-24 ENCOUNTER — Telehealth: Payer: Self-pay | Admitting: Sports Medicine

## 2016-08-24 NOTE — Telephone Encounter (Signed)
I spoke with Catherine Bullock on the phone today after reviewing the MRI of her lumbar spine. In comparison to the MRI done in 2007, there has been progression of a large L5-S1 right-sided paracentral disc protrusion. This results in mass effect upon the S1 nerve root. It also impinges upon the L5 nerve root. Patient and her husband are well established patients of Dr. Carloyn Manner and I think it would benefit her to see him to discuss treatment options. She has done well with epidural steroid injections in the past but based on the size of this disc protrusion she may need to ultimately have a surgical decompression.  I also discussed the results of her left knee x-ray. She has mild degenerative changes. If she continues to have pain in her left knee then I would consider a cortisone injection. She would like to think about this. She will follow-up with me as needed.

## 2016-08-25 ENCOUNTER — Other Ambulatory Visit: Payer: Self-pay | Admitting: *Deleted

## 2016-08-25 ENCOUNTER — Encounter (HOSPITAL_COMMUNITY): Payer: Self-pay

## 2016-08-25 ENCOUNTER — Encounter: Payer: Self-pay | Admitting: *Deleted

## 2016-08-25 ENCOUNTER — Ambulatory Visit (HOSPITAL_COMMUNITY)
Admission: RE | Admit: 2016-08-25 | Discharge: 2016-08-25 | Disposition: A | Discharge status: Home / Self Care | Payer: Medicare Other | Source: Ambulatory Visit | Attending: Internal Medicine | Admitting: Internal Medicine

## 2016-08-25 DIAGNOSIS — D473 Essential (hemorrhagic) thrombocythemia: Secondary | ICD-10-CM | POA: Diagnosis not present

## 2016-08-25 DIAGNOSIS — I341 Nonrheumatic mitral (valve) prolapse: Secondary | ICD-10-CM | POA: Insufficient documentation

## 2016-08-25 DIAGNOSIS — Z79899 Other long term (current) drug therapy: Secondary | ICD-10-CM | POA: Diagnosis not present

## 2016-08-25 DIAGNOSIS — Z87891 Personal history of nicotine dependence: Secondary | ICD-10-CM | POA: Diagnosis not present

## 2016-08-25 DIAGNOSIS — F419 Anxiety disorder, unspecified: Secondary | ICD-10-CM | POA: Diagnosis not present

## 2016-08-25 DIAGNOSIS — K219 Gastro-esophageal reflux disease without esophagitis: Secondary | ICD-10-CM | POA: Insufficient documentation

## 2016-08-25 DIAGNOSIS — Z9889 Other specified postprocedural states: Secondary | ICD-10-CM | POA: Diagnosis not present

## 2016-08-25 DIAGNOSIS — I10 Essential (primary) hypertension: Secondary | ICD-10-CM | POA: Insufficient documentation

## 2016-08-25 DIAGNOSIS — D509 Iron deficiency anemia, unspecified: Secondary | ICD-10-CM | POA: Diagnosis not present

## 2016-08-25 DIAGNOSIS — D7589 Other specified diseases of blood and blood-forming organs: Secondary | ICD-10-CM | POA: Diagnosis not present

## 2016-08-25 DIAGNOSIS — G40909 Epilepsy, unspecified, not intractable, without status epilepticus: Secondary | ICD-10-CM | POA: Diagnosis not present

## 2016-08-25 DIAGNOSIS — D472 Monoclonal gammopathy: Secondary | ICD-10-CM

## 2016-08-25 DIAGNOSIS — D4989 Neoplasm of unspecified behavior of other specified sites: Secondary | ICD-10-CM | POA: Diagnosis not present

## 2016-08-25 LAB — CBC
HCT: 35.6 % — ABNORMAL LOW (ref 36.0–46.0)
Hemoglobin: 12.4 g/dL (ref 12.0–15.0)
MCH: 30.8 pg (ref 26.0–34.0)
MCHC: 34.8 g/dL (ref 30.0–36.0)
MCV: 88.6 fL (ref 78.0–100.0)
PLATELETS: 481 10*3/uL — AB (ref 150–400)
RBC: 4.02 MIL/uL (ref 3.87–5.11)
RDW: 14.1 % (ref 11.5–15.5)
WBC: 5.6 10*3/uL (ref 4.0–10.5)

## 2016-08-25 LAB — PROTIME-INR
INR: 0.96
PROTHROMBIN TIME: 12.7 s (ref 11.4–15.2)

## 2016-08-25 LAB — BONE MARROW EXAM

## 2016-08-25 MED ORDER — FLUMAZENIL 0.5 MG/5ML IV SOLN
INTRAVENOUS | Status: AC
  Filled 2016-08-25: qty 5

## 2016-08-25 MED ORDER — MIDAZOLAM HCL 2 MG/2ML IJ SOLN
INTRAMUSCULAR | Status: AC
  Filled 2016-08-25: qty 4

## 2016-08-25 MED ORDER — FENTANYL CITRATE (PF) 100 MCG/2ML IJ SOLN
INTRAMUSCULAR | Status: AC | PRN
  Administered 2016-08-25: 25 ug via INTRAVENOUS
  Administered 2016-08-25: 50 ug via INTRAVENOUS
  Administered 2016-08-25: 25 ug via INTRAVENOUS

## 2016-08-25 MED ORDER — SODIUM CHLORIDE 0.9 % IV SOLN
INTRAVENOUS | Status: DC
  Administered 2016-08-25: 08:00:00 via INTRAVENOUS

## 2016-08-25 MED ORDER — FENTANYL CITRATE (PF) 100 MCG/2ML IJ SOLN
INTRAMUSCULAR | Status: AC
  Filled 2016-08-25: qty 4

## 2016-08-25 MED ORDER — MIDAZOLAM HCL 2 MG/2ML IJ SOLN
INTRAMUSCULAR | Status: AC | PRN
  Administered 2016-08-25 (×2): 1 mg via INTRAVENOUS

## 2016-08-25 MED ORDER — NALOXONE HCL 0.4 MG/ML IJ SOLN
INTRAMUSCULAR | Status: AC
  Filled 2016-08-25: qty 1

## 2016-08-25 NOTE — Consult Note (Signed)
Chief Complaint: Patient was seen in consultation today for CT guided bone marrow biopsy  Referring Physician(s): Mohamed,Mohamed  Supervising Physician: Markus Daft  Patient Status: [name] - Out-pt  History of Present Illness: Catherine Bullock is a 70 y.o. female with history of plasma cell dyscrasia diagnosed in January 2011 and currently under observation. She also has a history of essential thrombocythemia  as well as a recent myeloma panel showing significant increase in the free lambda light chain. She presents today for CT-guided bone marrow biopsy for further evaluation.  Past Medical History:  Diagnosis Date  . Anxiety   . Colon adenoma   . Degenerative disk disease   . Epilepsy ([place])    last seizure 1987  . GERD (gastroesophageal reflux disease)   . Hypertension   . IgG monoclonal gammopathy   . Iron deficiency anemia   . MVP (mitral valve prolapse)   . Osteoporosis   . Thrombocytosis ([place])     Past Surgical History:  Procedure Laterality Date  . BREAST BIOPSY  1992   benign  . TONSILLECTOMY  1961    Allergies: Review of patient's allergies indicates no known allergies.  Medications: Prior to Admission medications   Medication Sig Start Date End Date Taking? Authorizing Provider  ALPRAZolam (XANAX) 0.25 MG tablet Take 0.25 mg by mouth every 8 (eight) hours as needed. 01/23/16  Yes Historical Provider, MD  Calcium-Vitamin D-Vitamin K 500-100-40 MG-UNT-MCG CHEW Chew 2 capsules by mouth daily.   Yes Historical Provider, MD  escitalopram (LEXAPRO) 20 MG tablet Take 20 mg by mouth daily.   Yes Historical Provider, MD  ferrous sulfate 325 (65 FE) MG tablet Take 325 mg by mouth daily with breakfast.   Yes Historical Provider, MD  fish oil-omega-3 fatty acids 1000 MG capsule Take 1 g by mouth daily.   Yes Historical Provider, MD  HYDROcodone-acetaminophen [facility]) 10-325 MG tablet  08/03/16  Yes Historical Provider, MD  HYDROMET 5-1.5 MG/5ML syrup  08/11/16  Yes  Historical Provider, MD  Multiple Vitamin (MULTIVITAMIN) tablet Take 1 tablet by mouth daily.   Yes Historical Provider, MD  Omeprazole Magnesium (PRILOSEC OTC PO) Take by mouth daily.   Yes Historical Provider, MD  phenytoin (DILANTIN) 100 MG ER capsule Take by mouth 2 (two) times daily. 2 capsules in the am and 1 capsule in the pm   Yes Historical Provider, MD  Probiotic Product (PROBIOTIC DAILY PO) Take by mouth daily.   Yes Historical Provider, MD  tretinoin (RETIN-A) 0.1 % cream Apply 1 application topically daily. 12/16/15  Yes Historical Provider, MD  valsartan (DIOVAN) 80 MG tablet Take 80 mg by mouth daily. 12/25/15  Yes Historical Provider, MD  azithromycin (ZITHROMAX) 250 MG tablet  08/11/16   Historical Provider, MD  Coenzyme Q10 Liposomal 100 MG/ML LIQD Take by mouth daily.    Historical Provider, MD  Dexamethasone Sodium Phosphate (DECADRON) 20 MG/5ML injection USE UTD BY PHYSICAL THERAPIST 07/01/16   Historical Provider, MD  docusate sodium (COLACE) 100 MG capsule Take 100 mg by mouth 2 (two) times daily.    Historical Provider, MD     Family History  Problem Relation Age of Onset  . Nephrolithiasis Mother   . Alzheimer's disease Mother   . Ulcers Mother   . Prostate cancer Father   . Multiple myeloma Father   . Colon cancer Neg Hx     Social History   Social History  . Marital status: Married    Spouse name: N/A  .  Number of children: N/A  . Years of education: N/A   Social History Main Topics  . Smoking status: Former Smoker    Quit date: 06/21/1979  . Smokeless tobacco: Never Used  . Alcohol use 1.2 oz/week    2 Glasses of wine per week  . Drug use: No  . Sexual activity: Yes   Other Topics Concern  . None   Social History Narrative  . None      Review of Systems currently denies fever, headache, chest pain, dyspnea, cough, abdominal pain, nausea, vomiting or abnormal bleeding. She does have intermittent back pain.  Vital Signs: BP 136/60 (BP  Location: Right Arm)   Pulse 66   Temp 97.8 F (36.6 C) (Oral)   Resp 16   SpO2 100%   Physical Exam awake, alert. Chest clear to auscultation bilaterally. Heart with regular rate and rhythm; abdomen soft, positive bowel sounds, nontender. Lower extremities with no edema  Mallampati Score:     Imaging: Mr Lumbar Spine Wo Contrast  Result Date: 08/23/2016 CLINICAL DATA:  Acute midline low back pain with right-sided sciatica. EXAM: MRI LUMBAR SPINE WITHOUT CONTRAST TECHNIQUE: Multiplanar, multisequence MR imaging of the lumbar spine was performed. No intravenous contrast was administered. COMPARISON:  12/29/2005 FINDINGS: Segmentation:  Standard. Alignment:  Slight L5-S1 anterolisthesis. Vertebrae: Hypo intense appearance of the marrow diffusely without focal mass lesion. No acute fracture or discitis. Conus medullaris: Extends to the L1 level and appears normal. Paraspinal and other soft tissues: Negative Disc levels: T12- L1: Unremarkable. L1-L2: Unremarkable. L2-L3: Disc narrowing and bulging with right extra foraminal protrusion contacting the L2 nerve, similar to prior. L3-L4: Progressed disc narrowing and bulging. Progressed facet arthropathy with ligamentum flavum thickening. Left far-lateral disc protrusion without L3 impingement, new. New mild left subarticular recess stenosis. L4-L5: Progressed facet arthropathy with ligament hypertrophy and spurring. New mild left subarticular recess narrowing. Patent foramina L5-S1:Chronic large right paracentral disc protrusion is increased from prior, with greater mass effect on the descending S1 nerve. There is also now a foraminal component with L5 impingement. IMPRESSION: 1. L5-S1 right paracentral to foraminal disc protrusion with S1 and L5 impingement, progressed from 2007. 2. L4-5 new mild left subarticular recess stenosis. 3. L3-4 left extra foraminal protrusion that is new but noncompressive. Mild left subarticular recess also at this level. 4.  L2-3 chronic right extra foraminal disc protrusion with L2 contact. 5. Generalized increase in degenerative disc narrowing and facet spurring. 6. Unexpectedly marrow signal presumably related to patient's history of anemia and monoclonal gammopathy. No focal vertebral mass lesion. Electronically Signed   By: Monte Fantasia M.D.   On: 08/23/2016 08:40   Dg Knee Complete 4 Views Left  Result Date: 08/12/2016 CLINICAL DATA:  Medial left knee pain EXAM: LEFT KNEE - COMPLETE 4+ VIEW COMPARISON:  None. FINDINGS: Four views of the left knee submitted. No acute fracture or subluxation. Mild narrowing of medial joint compartment. No joint effusion. No periosteal reaction or bony erosion. Mild narrowing of patellofemoral joint space. IMPRESSION: No acute fracture or subluxation.  Mild degenerative changes. Electronically Signed   By: Lahoma Crocker M.D.   On: 08/12/2016 13:51    Labs:  CBC:  Recent Labs  02/13/16 0820 08/12/16 0824 08/25/16 0715  WBC 5.3 5.1 5.6  HGB 12.0 12.3 12.4  HCT 36.2 36.4 35.6*  PLT 413* 347 481*    COAGS:  Recent Labs  08/25/16 0715  INR 0.96    BMP:  Recent Labs  02/13/16 0820 08/12/16  0825  NA 139 136  K 4.6 4.4  CO2 25 25  GLUCOSE 103 107  BUN 30.0* 23.2  CALCIUM 9.0 9.2  CREATININE 0.9 0.9    LIVER FUNCTION TESTS:  Recent Labs  02/13/16 0820 08/12/16 0825  BILITOT <0.30 0.27  AST 24 40*  ALT 46 56*  ALKPHOS 110 75  PROT 6.3* 6.7  ALBUMIN 3.2* 3.2*    TUMOR MARKERS: No results for input(s): AFPTM, CEA, CA199, CHROMGRNA in the last 8760 hours.  Assessment and Plan:  70 y.o. female with history of plasma cell dyscrasia diagnosed in January 2011 and currently under observation. She also has a history of essential thrombocythemia  as well as a recent myeloma panel showing significant increase in the free lambda light chain. She presents today for CT-guided bone marrow biopsy for further evaluation.Risks and benefits discussed with the  patient/husband including, but not limited to bleeding, infection, damage to adjacent structures or low yield requiring additional tests.All of the patient's questions were answered, patient is agreeable to proceed.Consent signed and in chart.     Thank you for this interesting consult.  I greatly enjoyed meeting BEATA BEASON and look forward to participating in their care.  A copy of this report was sent to the requesting provider on this date.  Electronically Signed: D. Rowe Robert 08/25/2016, 8:31 AM   I spent a total of 20 minutes    in face to face in clinical consultation, greater than 50% of which was counseling/coordinating care for CT-guided bone marrow biopsy

## 2016-08-25 NOTE — Progress Notes (Signed)
Called Dr. Rex Kras office [phone]) to schedule pt an appt but they are booking out into Feb 2018. Pt informed of this and she will call Dr Carloyn Manner himself or look for a NS here in [place]. Currently dealing with a family issue that takes precedent over the NS appointment at this time.

## 2016-08-25 NOTE — Procedures (Signed)
CT guided bone marrow biopsy.  2 aspirates and 1 core obtained.  No immediate complication.  See full report in PACS.

## 2016-08-25 NOTE — Discharge Instructions (Signed)
Moderate Conscious Sedation, Adult, Care After Refer to this sheet in the next few weeks. These instructions provide you with information on caring for yourself after your procedure. Your health care provider may also give you more specific instructions. Your treatment has been planned according to current medical practices, but problems sometimes occur. Call your health care provider if you have any problems or questions after your procedure. WHAT TO EXPECT AFTER THE PROCEDURE  After your procedure:  You may feel sleepy, clumsy, and have poor balance for several hours.  Vomiting may occur if you eat too soon after the procedure. HOME CARE INSTRUCTIONS  Do not participate in any activities where you could become injured for at least 24 hours. Do not:  Drive.  Swim.  Ride a bicycle.  Operate heavy machinery.  Cook.  Use power tools.  Climb ladders.  Work from a high place.  Do not make important decisions or sign legal documents until you are improved.  If you vomit, drink water, juice, or soup when you can drink without vomiting. Make sure you have little or no nausea before eating solid foods.  Only take over-the-counter or prescription medicines for pain, discomfort, or fever as directed by your health care provider.  Make sure you and your family fully understand everything about the medicines given to you, including what side effects may occur.  You should not drink alcohol, take sleeping pills, or take medicines that cause drowsiness for at least 24 hours.  If you smoke, do not smoke without supervision.  If you are feeling better, you may resume normal activities 24 hours after you were sedated.  Keep all appointments with your health care provider. SEEK MEDICAL CARE IF:  Your skin is pale or bluish in color.  You continue to feel nauseous or vomit.  Your pain is getting worse and is not helped by medicine.  You have bleeding or swelling.  You are still  sleepy or feeling clumsy after 24 hours. SEEK IMMEDIATE MEDICAL CARE IF:  You develop a rash.  You have difficulty breathing.  You develop any type of allergic problem.  You have a fever. MAKE SURE YOU:  Understand these instructions.  Will watch your condition.  Will get help right away if you are not doing well or get worse.   This information is not intended to replace advice given to you by your health care provider. Make sure you discuss any questions you have with your health care provider.   Document Released: 08/08/2013 Document Revised: 11/08/2014 Document Reviewed: 08/08/2013 Elsevier Interactive Patient Education 2016 [place].   Bone Marrow Aspiration and Bone Marrow Biopsy, Care After Refer to this sheet in the next few weeks. These instructions provide you with information about caring for yourself after your procedure. Your health care provider may also give you more specific instructions. Your treatment has been planned according to current medical practices, but problems sometimes occur. Call your health care provider if you have any problems or questions after your procedure. WHAT TO EXPECT AFTER THE PROCEDURE After your procedure, it is common to have:  Soreness or tenderness around the puncture site.  Bruising. HOME CARE INSTRUCTIONS  Take medicines only as directed by your health care provider.  Follow your health care provider's instructions about:  Puncture site care.  Bandage (dressing) changes and removal.  Bathe and shower as directed by your health care provider.  Check your puncture site every day for signs of infection. Watch for:  Redness, swelling,  or pain.  Fluid, blood, or pus.  Return to your normal activities as directed by your health care provider.  Keep all follow-up visits as directed by your health care provider. This is important. SEEK MEDICAL CARE IF:  You have a fever.  You have uncontrollable bleeding.  You  have redness, swelling, or pain at the site of your puncture.  You have fluid, blood, or pus coming from your puncture site.   This information is not intended to replace advice given to you by your health care provider. Make sure you discuss any questions you have with your health care provider.   Document Released: 05/07/2005 Document Revised: 03/04/2015 Document Reviewed: 10/09/2014 Elsevier Interactive Patient Education [employer].

## 2016-08-30 ENCOUNTER — Telehealth: Payer: Self-pay | Admitting: Medical Oncology

## 2016-08-30 NOTE — Telephone Encounter (Signed)
Asking for BM bx results. Pt notified to keep appt.

## 2016-08-31 ENCOUNTER — Telehealth: Payer: Self-pay | Admitting: *Deleted

## 2016-08-31 NOTE — Telephone Encounter (Signed)
Per Julien Nordmann I told pt that bx is not urgent she has plasma cell. She requests to move up appt.

## 2016-08-31 NOTE — Telephone Encounter (Signed)
I told pt that per [facility] it is not urgent to start tx even if that is an option.

## 2016-08-31 NOTE — Telephone Encounter (Signed)
"  I'm calling for my wife's biopsy results.  They said Dr. Julien Nordmann would have results by Friday but he was off.  We called yesterday and no one has called back yet.  Cancer is critical and we need to know what is going on or where we stand in this matter."  [place] continuing to wait for total results which may take up to ten days.  Results will be discussed at next F/U."   "I'm not buying that.  The Radiologist said results should be in Friday evening at the latest for Dr. Julien Nordmann to review.  Someone from the hospital had better call to tell us where we stand so she can begin treatment  This is not reasonable to keep her waiting until the middle of next month for results.  Some one with [facility] needs to call or do I need to come there to talk with a hospital administrator.  I've lost one wife to cancer and will not loose another.  Call me at [phone]"

## 2016-08-31 NOTE — Telephone Encounter (Signed)
See 08-30-2016 phone note.

## 2016-09-02 LAB — CHROMOSOME ANALYSIS, BONE MARROW

## 2016-09-07 ENCOUNTER — Telehealth: Payer: Self-pay | Admitting: Internal Medicine

## 2016-09-07 ENCOUNTER — Ambulatory Visit (HOSPITAL_BASED_OUTPATIENT_CLINIC_OR_DEPARTMENT_OTHER): Payer: Medicare Other | Admitting: Internal Medicine

## 2016-09-07 ENCOUNTER — Encounter: Payer: Self-pay | Admitting: Internal Medicine

## 2016-09-07 VITALS — BP 132/84 | HR 68 | Temp 97.8°F | Resp 16 | Ht 63.0 in | Wt 121.0 lb

## 2016-09-07 DIAGNOSIS — D472 Monoclonal gammopathy: Secondary | ICD-10-CM | POA: Diagnosis not present

## 2016-09-07 NOTE — Telephone Encounter (Signed)
Appointments scheduled per 09/07/16 los. AVS report and appointment schedule given to patient, per 09/07/16 los. °

## 2016-09-07 NOTE — Telephone Encounter (Signed)
FAXED RECORDS TO [facility] RELEASE ID [id number]

## 2016-09-07 NOTE — Progress Notes (Signed)
[facility] Telephone:(336) [phone]   Fax:(336) [place], [place] [address]  DIAGNOSIS:  1) plasma cell dyscrasia diagnosed in January 2011. 2) history of essential thrombocythemia  PRIOR THERAPY: The patient was treated in the past with anagrelide for essential thrombocythemia under the care of Dr. Jana Hakim.  CURRENT THERAPY: Observation.  INTERVAL HISTORY: Catherine Bullock 70 y.o. female returns to the clinic today for follow-up visit accompanied by her husband. The patient is doing fine today with no specific complaints. She recently underwent CT-guided bone marrow biopsy and aspirate that showed increased of the plasma cells to 22% consistent with plasma cell neoplasm. She denied having any significant weight loss or night sweats. She has no nausea or vomiting, no fever or chills. She denied having any significant weight loss or night sweats. She is here today for evaluation and discussion of her bone marrow results and treatment options.  MEDICAL HISTORY: Past Medical History:  Diagnosis Date  . Anxiety   . Colon adenoma   . Degenerative disk disease   . Epilepsy ([facility])    last seizure 1987  . GERD (gastroesophageal reflux disease)   . Hypertension   . IgG monoclonal gammopathy   . Iron deficiency anemia   . MVP (mitral valve prolapse)   . Osteoporosis   . Thrombocytosis ([facility])     ALLERGIES:  has No Known Allergies.  MEDICATIONS:  Current Outpatient Prescriptions  Medication Sig Dispense Refill  . ALPRAZolam (XANAX) 0.25 MG tablet Take 0.25 mg by mouth every 8 (eight) hours as needed.  1  . Calcium-Vitamin D-Vitamin K 500-100-40 MG-UNT-MCG CHEW Chew 2 capsules by mouth daily.    . Coenzyme Q10 Liposomal 100 MG/ML LIQD Take by mouth daily.    Marland Kitchen docusate sodium (COLACE) 100 MG capsule Take 100 mg by mouth 2 (two) times daily.    Marland Kitchen escitalopram (LEXAPRO) 20 MG tablet Take 20  mg by mouth daily.    . ferrous sulfate 325 (65 FE) MG tablet Take 325 mg by mouth daily with breakfast.    . fish oil-omega-3 fatty acids 1000 MG capsule Take 1 g by mouth daily.    . Multiple Vitamin (MULTIVITAMIN) tablet Take 1 tablet by mouth daily.    . Omeprazole Magnesium (PRILOSEC OTC PO) Take by mouth daily.    . phenytoin (DILANTIN) 100 MG ER capsule Take by mouth 2 (two) times daily. 2 capsules in the am and 1 capsule in the pm    . Probiotic Product (PROBIOTIC DAILY PO) Take by mouth daily.    Marland Kitchen tretinoin (RETIN-A) 0.1 % cream Apply 1 application topically daily.  4  . valsartan (DIOVAN) 80 MG tablet Take 80 mg by mouth daily.  1  . HYDROcodone-acetaminophen (NORCO) 10-325 MG tablet     . HYDROMET 5-1.5 MG/5ML syrup      No current facility-administered medications for this visit.     SURGICAL HISTORY:  Past Surgical History:  Procedure Laterality Date  . BREAST BIOPSY  1992   benign  . TONSILLECTOMY  1961    REVIEW OF SYSTEMS:  Constitutional: negative Eyes: negative Ears, nose, mouth, throat, and face: negative Respiratory: negative Cardiovascular: negative Gastrointestinal: negative Genitourinary:negative Integument/breast: negative Hematologic/lymphatic: negative Musculoskeletal:negative Neurological: negative Behavioral/Psych: negative Endocrine: negative Allergic/Immunologic: negative   PHYSICAL EXAMINATION: General appearance: alert, cooperative and no distress Head: Normocephalic, without obvious abnormality, atraumatic Neck: no adenopathy, no JVD, supple,  symmetrical, trachea midline and thyroid not enlarged, symmetric, no tenderness/mass/nodules Lymph nodes: Cervical, supraclavicular, and axillary nodes normal. Resp: clear to auscultation bilaterally Back: symmetric, no curvature. ROM normal. No CVA tenderness. Cardio: regular rate and rhythm, S1, S2 normal, no murmur, click, rub or gallop GI: soft, non-tender; bowel sounds normal; no masses,  no  organomegaly Extremities: extremities normal, atraumatic, no cyanosis or edema Neurologic: Alert and oriented X 3, normal strength and tone. Normal symmetric reflexes. Normal coordination and gait  ECOG PERFORMANCE STATUS: 0 - Asymptomatic  Blood pressure 132/84, pulse 68, temperature 97.8 F (36.6 C), temperature source Oral, resp. rate 16, height 5' 3"  (1.6 m), weight 121 lb (54.9 kg), SpO2 100 %.  LABORATORY DATA: Lab Results  Component Value Date   WBC 5.6 08/25/2016   HGB 12.4 08/25/2016   HCT 35.6 (L) 08/25/2016   MCV 88.6 08/25/2016   PLT 481 (H) 08/25/2016      Chemistry      Component Value Date/Time   NA 136 08/12/2016 0825   K 4.4 08/12/2016 0825   CL 99 12/25/2012 1323   CO2 25 08/12/2016 0825   BUN 23.2 08/12/2016 0825   CREATININE 0.9 08/12/2016 0825      Component Value Date/Time   CALCIUM 9.2 08/12/2016 0825   ALKPHOS 75 08/12/2016 0825   AST 40 (H) 08/12/2016 0825   ALT 56 (H) 08/12/2016 0825   BILITOT 0.27 08/12/2016 0825     Myeloma Panel: Beta-2 microglobulin 2.9, free Kappa light chain 10.8, free lambda light chain 987.90,Kappa/lambda ratio 0.01. IgG 446, IgA 63 and IgM 29.  RADIOGRAPHIC STUDIES: Mr Lumbar Spine Wo Contrast  Result Date: 08/23/2016 CLINICAL DATA:  Acute midline low back pain with right-sided sciatica. EXAM: MRI LUMBAR SPINE WITHOUT CONTRAST TECHNIQUE: Multiplanar, multisequence MR imaging of the lumbar spine was performed. No intravenous contrast was administered. COMPARISON:  12/29/2005 FINDINGS: Segmentation:  Standard. Alignment:  Slight L5-S1 anterolisthesis. Vertebrae: Hypo intense appearance of the marrow diffusely without focal mass lesion. No acute fracture or discitis. Conus medullaris: Extends to the L1 level and appears normal. Paraspinal and other soft tissues: Negative Disc levels: T12- L1: Unremarkable. L1-L2: Unremarkable. L2-L3: Disc narrowing and bulging with right extra foraminal protrusion contacting the L2 nerve,  similar to prior. L3-L4: Progressed disc narrowing and bulging. Progressed facet arthropathy with ligamentum flavum thickening. Left far-lateral disc protrusion without L3 impingement, new. New mild left subarticular recess stenosis. L4-L5: Progressed facet arthropathy with ligament hypertrophy and spurring. New mild left subarticular recess narrowing. Patent foramina L5-S1:Chronic large right paracentral disc protrusion is increased from prior, with greater mass effect on the descending S1 nerve. There is also now a foraminal component with L5 impingement. IMPRESSION: 1. L5-S1 right paracentral to foraminal disc protrusion with S1 and L5 impingement, progressed from 2007. 2. L4-5 new mild left subarticular recess stenosis. 3. L3-4 left extra foraminal protrusion that is new but noncompressive. Mild left subarticular recess also at this level. 4. L2-3 chronic right extra foraminal disc protrusion with L2 contact. 5. Generalized increase in degenerative disc narrowing and facet spurring. 6. Unexpectedly marrow signal presumably related to patient's history of anemia and monoclonal gammopathy. No focal vertebral mass lesion. Electronically Signed   By: Monte Fantasia M.D.   On: 08/23/2016 08:40   Ct Biopsy  Result Date: 08/25/2016 INDICATION: 70 year old with monoclonal gammopathy of unknown significance. Increase in free lambda light chains. History of plasma cell dyscrasia and essential thrombocythemia. EXAM: CT GUIDED BONE MARROW ASPIRATES AND BIOPSY Physician: Quita Skye  Acquanetta Belling, MD MEDICATIONS: None. ANESTHESIA/SEDATION: Fentanyl 100 mcg IV; Versed 2.0 mg IV Moderate Sedation Time:  10 minutes The patient was continuously monitored during the procedure by the interventional radiology nurse under my direct supervision. COMPLICATIONS: None immediate. PROCEDURE: The procedure was explained to the patient. The risks and benefits of the procedure were discussed and the patient's questions were addressed. Informed  consent was obtained from the patient. The patient was placed prone on CT scan. Images of the pelvis were obtained. The right side of back was prepped and draped in sterile fashion. The skin and right posterior iliac bone were anesthetized with 1% lidocaine. 11 gauge bone needle was directed into the right iliac bone with CT guidance. Two aspirates and single core biopsy obtained. Bandage placed over the puncture site. IMPRESSION: CT guided bone marrow aspirates and core biopsy. Electronically Signed   By: Markus Daft M.D.   On: 08/25/2016 13:09   Dg Knee Complete 4 Views Left  Result Date: 08/12/2016 CLINICAL DATA:  Medial left knee pain EXAM: LEFT KNEE - COMPLETE 4+ VIEW COMPARISON:  None. FINDINGS: Four views of the left knee submitted. No acute fracture or subluxation. Mild narrowing of medial joint compartment. No joint effusion. No periosteal reaction or bony erosion. Mild narrowing of patellofemoral joint space. IMPRESSION: No acute fracture or subluxation.  Mild degenerative changes. Electronically Signed   By: Lahoma Crocker M.D.   On: 08/12/2016 13:51    ASSESSMENT AND PLAN:   This is a very pleasant 70 years old white female with history of plasma cell dyscrasia diagnosed in January 2011 and has been on observation since that time with no active treatment.  The patient also has a history of essential thrombocythemia and currently on observation.  She is feeling fine today with no specific complaints.  The recent bone marrow biopsy and aspirate showed further increase in her plasma cells up to 22%. This is now more concerning of multiple myeloma. I had a lengthy discussion with the patient and her husband today about her current condition and treatment options. I gave the patient the option of continuous observation, monitoring versus proceeding with systemic therapy with Velcade, Revlimid and Decadron. She would like to continue on observation for the next few months until after the holiday. I  will see her back for follow-up visit in late January 2018 with repeat myeloma panel. I may consider the patient for treatment at that time if she continues to have increase in her monoclonal proteins. The patient and her husband agreed to the current plan.  The patient was advised to call immediately if she has any concerning symptoms in the interval. The patient voices understanding of current disease status and treatment options and is in agreement with the current care plan.  All questions were answered. The patient knows to call the clinic with any problems, questions or concerns. We can certainly see the patient much sooner if necessary.  Disclaimer: This note was dictated with voice recognition software. Similar sounding words can inadvertently be transcribed and may not be corrected upon review.

## 2016-09-09 ENCOUNTER — Encounter (HOSPITAL_COMMUNITY): Payer: Self-pay

## 2016-09-14 ENCOUNTER — Ambulatory Visit: Payer: Medicare Other | Admitting: Internal Medicine

## 2016-09-21 DIAGNOSIS — C9000 Multiple myeloma not having achieved remission: Secondary | ICD-10-CM | POA: Diagnosis not present

## 2016-09-22 ENCOUNTER — Other Ambulatory Visit: Payer: Self-pay | Admitting: Obstetrics and Gynecology

## 2016-09-22 DIAGNOSIS — Z1231 Encounter for screening mammogram for malignant neoplasm of breast: Secondary | ICD-10-CM

## 2016-09-27 DIAGNOSIS — D126 Benign neoplasm of colon, unspecified: Secondary | ICD-10-CM | POA: Diagnosis not present

## 2016-09-27 DIAGNOSIS — D649 Anemia, unspecified: Secondary | ICD-10-CM | POA: Diagnosis not present

## 2016-09-27 DIAGNOSIS — C9000 Multiple myeloma not having achieved remission: Secondary | ICD-10-CM | POA: Diagnosis not present

## 2016-09-27 DIAGNOSIS — D473 Essential (hemorrhagic) thrombocythemia: Secondary | ICD-10-CM | POA: Diagnosis not present

## 2016-09-27 DIAGNOSIS — Z87891 Personal history of nicotine dependence: Secondary | ICD-10-CM | POA: Diagnosis not present

## 2016-09-27 DIAGNOSIS — D704 Cyclic neutropenia: Secondary | ICD-10-CM | POA: Diagnosis not present

## 2016-09-28 DIAGNOSIS — C9002 Multiple myeloma in relapse: Secondary | ICD-10-CM

## 2016-09-28 DIAGNOSIS — D473 Essential (hemorrhagic) thrombocythemia: Secondary | ICD-10-CM | POA: Insufficient documentation

## 2016-09-28 HISTORY — DX: Essential (hemorrhagic) thrombocythemia: D47.3

## 2016-09-28 HISTORY — DX: Multiple myeloma in relapse: C90.02

## 2016-10-07 DIAGNOSIS — E041 Nontoxic single thyroid nodule: Secondary | ICD-10-CM | POA: Diagnosis not present

## 2016-10-07 DIAGNOSIS — C9000 Multiple myeloma not having achieved remission: Secondary | ICD-10-CM | POA: Diagnosis not present

## 2016-10-07 DIAGNOSIS — Z006 Encounter for examination for normal comparison and control in clinical research program: Secondary | ICD-10-CM | POA: Diagnosis not present

## 2016-10-20 ENCOUNTER — Telehealth: Payer: Self-pay | Admitting: Medical Oncology

## 2016-10-20 DIAGNOSIS — D473 Essential (hemorrhagic) thrombocythemia: Secondary | ICD-10-CM | POA: Diagnosis not present

## 2016-10-20 DIAGNOSIS — Z87891 Personal history of nicotine dependence: Secondary | ICD-10-CM | POA: Diagnosis not present

## 2016-10-20 DIAGNOSIS — C9000 Multiple myeloma not having achieved remission: Secondary | ICD-10-CM | POA: Diagnosis not present

## 2016-10-20 DIAGNOSIS — Z7982 Long term (current) use of aspirin: Secondary | ICD-10-CM | POA: Diagnosis not present

## 2016-10-20 NOTE — Telephone Encounter (Signed)
Pt does not qualify for clinical trial for MM due to essential thrombocytosis. Dr Tiana Loft is sending pt back to [facility] with tx recommendations : RVD. Pt wants to start 12/28 . Note to [place].

## 2016-10-20 NOTE — Telephone Encounter (Signed)
Will be happy to see her soon depending on availability. Probably will not be able to start 12/28

## 2016-10-21 NOTE — Telephone Encounter (Signed)
Onc tx request sent  

## 2016-10-28 ENCOUNTER — Encounter: Payer: Self-pay | Admitting: *Deleted

## 2016-10-28 ENCOUNTER — Ambulatory Visit: Payer: Medicare Other

## 2016-11-08 ENCOUNTER — Encounter: Payer: Self-pay | Admitting: Internal Medicine

## 2016-11-08 ENCOUNTER — Telehealth: Payer: Self-pay | Admitting: *Deleted

## 2016-11-08 ENCOUNTER — Encounter: Payer: Self-pay | Admitting: Medical Oncology

## 2016-11-08 ENCOUNTER — Telehealth: Payer: Self-pay | Admitting: Medical Oncology

## 2016-11-08 ENCOUNTER — Other Ambulatory Visit (HOSPITAL_BASED_OUTPATIENT_CLINIC_OR_DEPARTMENT_OTHER): Payer: Medicare Other

## 2016-11-08 ENCOUNTER — Ambulatory Visit: Payer: Medicare Other

## 2016-11-08 ENCOUNTER — Other Ambulatory Visit: Payer: Medicare Other

## 2016-11-08 ENCOUNTER — Telehealth: Payer: Self-pay | Admitting: Internal Medicine

## 2016-11-08 ENCOUNTER — Ambulatory Visit (HOSPITAL_BASED_OUTPATIENT_CLINIC_OR_DEPARTMENT_OTHER): Payer: Medicare Other | Admitting: Internal Medicine

## 2016-11-08 DIAGNOSIS — R748 Abnormal levels of other serum enzymes: Secondary | ICD-10-CM

## 2016-11-08 DIAGNOSIS — F411 Generalized anxiety disorder: Secondary | ICD-10-CM | POA: Insufficient documentation

## 2016-11-08 DIAGNOSIS — F418 Other specified anxiety disorders: Secondary | ICD-10-CM | POA: Diagnosis not present

## 2016-11-08 DIAGNOSIS — D472 Monoclonal gammopathy: Secondary | ICD-10-CM

## 2016-11-08 DIAGNOSIS — F419 Anxiety disorder, unspecified: Secondary | ICD-10-CM | POA: Insufficient documentation

## 2016-11-08 DIAGNOSIS — C9000 Multiple myeloma not having achieved remission: Secondary | ICD-10-CM

## 2016-11-08 DIAGNOSIS — K7689 Other specified diseases of liver: Secondary | ICD-10-CM | POA: Insufficient documentation

## 2016-11-08 DIAGNOSIS — Z7289 Other problems related to lifestyle: Secondary | ICD-10-CM | POA: Diagnosis not present

## 2016-11-08 HISTORY — DX: Other specified diseases of liver: K76.89

## 2016-11-08 HISTORY — DX: Multiple myeloma not having achieved remission: C90.00

## 2016-11-08 HISTORY — DX: Generalized anxiety disorder: F41.1

## 2016-11-08 LAB — CBC WITH DIFFERENTIAL/PLATELET
BASO%: 2.1 % — ABNORMAL HIGH (ref 0.0–2.0)
Basophils Absolute: 0.1 10*3/uL (ref 0.0–0.1)
EOS ABS: 0.3 10*3/uL (ref 0.0–0.5)
EOS%: 6 % (ref 0.0–7.0)
HCT: 37 % (ref 34.8–46.6)
HGB: 12.5 g/dL (ref 11.6–15.9)
LYMPH%: 31.8 % (ref 14.0–49.7)
MCH: 30.9 pg (ref 25.1–34.0)
MCHC: 33.8 g/dL (ref 31.5–36.0)
MCV: 91.6 fL (ref 79.5–101.0)
MONO#: 1.1 10*3/uL — AB (ref 0.1–0.9)
MONO%: 18.7 % — ABNORMAL HIGH (ref 0.0–14.0)
NEUT#: 2.3 10*3/uL (ref 1.5–6.5)
NEUT%: 41.4 % (ref 38.4–76.8)
PLATELETS: 305 10*3/uL (ref 145–400)
RBC: 4.04 10*6/uL (ref 3.70–5.45)
RDW: 14.6 % — ABNORMAL HIGH (ref 11.2–14.5)
WBC: 5.6 10*3/uL (ref 3.9–10.3)
lymph#: 1.8 10*3/uL (ref 0.9–3.3)

## 2016-11-08 LAB — COMPREHENSIVE METABOLIC PANEL
ALT: 413 U/L (ref 0–55)
ANION GAP: 9 meq/L (ref 3–11)
AST: 216 U/L (ref 5–34)
Albumin: 3.3 g/dL — ABNORMAL LOW (ref 3.5–5.0)
Alkaline Phosphatase: 110 U/L (ref 40–150)
BILIRUBIN TOTAL: 0.28 mg/dL (ref 0.20–1.20)
BUN: 18.7 mg/dL (ref 7.0–26.0)
CHLORIDE: 106 meq/L (ref 98–109)
CO2: 26 mEq/L (ref 22–29)
Calcium: 9.4 mg/dL (ref 8.4–10.4)
Creatinine: 0.9 mg/dL (ref 0.6–1.1)
EGFR: 70 mL/min/{1.73_m2} — AB (ref >90)
Glucose: 104 mg/dl (ref 70–140)
Potassium: 5 mEq/L (ref 3.5–5.1)
Sodium: 140 mEq/L (ref 136–145)
Total Protein: 6.3 g/dL — ABNORMAL LOW (ref 6.4–8.3)

## 2016-11-08 LAB — LACTATE DEHYDROGENASE: LDH: 245 U/L (ref 125–245)

## 2016-11-08 MED ORDER — DEXAMETHASONE 4 MG PO TABS: ORAL_TABLET | ORAL | 4 refills | Status: DC

## 2016-11-08 MED ORDER — ALPRAZOLAM 0.25 MG PO TABS: 0.2500 mg | ORAL_TABLET | Freq: Three times a day (TID) | ORAL | 1 refills | Status: AC | PRN

## 2016-11-08 MED ORDER — ACYCLOVIR 200 MG PO CAPS: 200.0000 mg | ORAL_CAPSULE | Freq: Two times a day (BID) | ORAL | 2 refills | Status: DC

## 2016-11-08 NOTE — Telephone Encounter (Signed)
Per 1/8 LOS I have scheduled appts and notified the scheduler

## 2016-11-08 NOTE — Telephone Encounter (Signed)
Pt stated " I am not coming in unless I am getting a treatment, its only a 5 minute shot-I know it is a 1 hour appt., Dr Norma Fredrickson explained everything to me. I instructed pt that Dr Julien Nordmann wants to talk to her about the tx regimen  And f/u and get consent.

## 2016-11-08 NOTE — Progress Notes (Signed)
START ON PATHWAY REGIMEN - Multiple Myeloma  [id number]: VRd (Bortezomib 1.3 mg/m2 Subcut D1, 8, 15 + Lenalidomide 25 mg + Dexamethasone 40 mg) q21 Days x 4-6 Cycles Maximum Prior to Stem Cell Harvest   A cycle is every 21 days:     Bortezomib (Velcade(R)) 1.3 mg/m2 administer subcut days 1, 8, 15 Dose Mod: None     Lenalidomide (Revlimid(R)) 25 mg orally daily days 1 through 14 Dose Mod: None     Dexamethasone (Decadron(R)) 40 mg orally once a week days 1, 8, 15 Dose Mod: None Additional Orders: Herpes zoster prophylaxis recommended. Antithrombotic therapy: aspirin 81-325 mg PO daily for patients at low risk; enoxaparin 40 mg subcut daily or warfarin to an INR of 2-3 for patients at higher risk.  **Always confirm dose/schedule in your pharmacy ordering system**    Patient Characteristics: Newly Diagnosed, Transplant Eligible, Standard Risk R-ISS Staging: II Disease Classification: Newly Diagnosed Is Patient Eligible for Transplant? Transplant Eligible Risk Status: Standard Risk  Intent of Therapy: Curative Intent, Discussed with Patient

## 2016-11-08 NOTE — Telephone Encounter (Signed)
"  I received a message that my chemotherapy appointment was cancelled.  Was this an error?  Is Mychart the way someone is notified of appointment cancellations?  I called [facility] Friday who tried to call to find out why as well.  I've been waiting two weeks to start treatment.  Is there any reason for me to come in today if I am not receiving treatment?  Please call mobile or home number."

## 2016-11-08 NOTE — Progress Notes (Signed)
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 2) history of essential thrombocythemia  PRIOR THERAPY: Anagrelide for essential thrombocythemia under the care of Dr. Jana Hakim.  CURRENT THERAPY: Systemic chemotherapy with Velcade 1.3 MG/M2 subcutaneously on days 1, 8 and 15 as well as Revlimid 25 mg by mouth daily for 3 weeks and weekly Decadron 40 mg by mouth every 4 weeks cycle. First dose 11/15/2016.  INTERVAL HISTORY: Catherine Bullock 71 y.o. female returns to the clinic today for follow-up visit accompanied by her husband. The patient is feeling fine today but very anxious about her diagnosis and she wants to start treatment as soon as possible. She was seen recently by Dr. Norma Fredrickson at [facility] for second opinion regarding her condition and he agreed with the recommendation to proceed with systemic chemotherapy at this point. She denied having any weight loss or night sweats. She has no nausea, vomiting, diarrhea or constipation. She has no fever or chills. She denied having any significant chest pain, shortness of breath, cough or hemoptysis. She has no bleeding or bruises. She is here today for evaluation and discussion of her treatment options.  MEDICAL HISTORY: Past Medical History:  Diagnosis Date  . Anxiety   . Colon adenoma   . Degenerative disk disease   . Epilepsy ([place])    last seizure 1987  . GERD (gastroesophageal reflux disease)   . Hypertension   . IgG monoclonal gammopathy   . Iron deficiency anemia   . MVP (mitral valve prolapse)   . Osteoporosis   . Thrombocytosis ([facility])     ALLERGIES:  has No Known Allergies.  MEDICATIONS:  Current Outpatient Prescriptions  Medication Sig Dispense Refill  . allopurinol  (ZYLOPRIM) 300 MG tablet Take 300 mg by mouth daily.  0  . ALPRAZolam (XANAX) 0.25 MG tablet Take 0.25 mg by mouth every 8 (eight) hours as needed.  1  . Calcium-Vitamin D-Vitamin K 500-100-40 MG-UNT-MCG CHEW Chew 2 capsules by mouth daily.    Marland Kitchen docusate sodium (COLACE) 100 MG capsule Take 100 mg by mouth 2 (two) times daily.    Marland Kitchen escitalopram (LEXAPRO) 20 MG tablet Take 20 mg by mouth daily.    . ferrous sulfate 325 (65 FE) MG tablet Take 325 mg by mouth daily with breakfast.    . fish oil-omega-3 fatty acids 1000 MG capsule Take 1 g by mouth daily.    Marland Kitchen HYDROcodone-acetaminophen (NORCO) 10-325 MG tablet     . Multiple Vitamin (MULTIVITAMIN) tablet Take 1 tablet by mouth daily.    . phenytoin (DILANTIN) 100 MG ER capsule Take by mouth 2 (two) times daily. 2 capsules in the am and 1 capsule in the pm    . Probiotic Product (PROBIOTIC DAILY PO) Take by mouth daily.    Marland Kitchen tretinoin (RETIN-A) 0.1 % cream Apply 1 application topically daily.  4  . valsartan (DIOVAN) 80 MG tablet Take 80 mg by mouth daily.  1  . Omeprazole Magnesium (PRILOSEC OTC PO) Take by mouth daily.     No current facility-administered medications for this visit.     SURGICAL HISTORY:  Past Surgical History:  Procedure Laterality Date  . BREAST BIOPSY  1992   benign  . TONSILLECTOMY  1961    REVIEW  OF SYSTEMS:  Constitutional: negative Eyes: negative Ears, nose, mouth, throat, and face: negative Respiratory: negative Cardiovascular: negative Gastrointestinal: negative Genitourinary:negative Integument/breast: negative Hematologic/lymphatic: negative Musculoskeletal:negative Neurological: negative Behavioral/Psych: positive for anxiety Endocrine: negative Allergic/Immunologic: negative   PHYSICAL EXAMINATION: General appearance: alert, cooperative and no distress Head: Normocephalic, without obvious abnormality, atraumatic Neck: no adenopathy, no JVD, supple, symmetrical, trachea midline and thyroid not  enlarged, symmetric, no tenderness/mass/nodules Lymph nodes: Cervical, supraclavicular, and axillary nodes normal. Resp: clear to auscultation bilaterally Back: symmetric, no curvature. ROM normal. No CVA tenderness. Cardio: regular rate and rhythm, S1, S2 normal, no murmur, click, rub or gallop GI: soft, non-tender; bowel sounds normal; no masses,  no organomegaly Extremities: extremities normal, atraumatic, no cyanosis or edema Neurologic: Alert and oriented X 3, normal strength and tone. Normal symmetric reflexes. Normal coordination and gait  ECOG PERFORMANCE STATUS: 1 - Symptomatic but completely ambulatory  Blood pressure 122/81, pulse 62, temperature 97.9 F (36.6 C), temperature source Oral, resp. rate 18, height 5' 3"  (1.6 m), weight 124 lb (56.2 kg), SpO2 100 %.  LABORATORY DATA: Lab Results  Component Value Date   WBC 5.6 11/08/2016   HGB 12.5 11/08/2016   HCT 37.0 11/08/2016   MCV 91.6 11/08/2016   PLT 305 11/08/2016      Chemistry      Component Value Date/Time   NA 140 11/08/2016 1107   K 5.0 11/08/2016 1107   CL 99 12/25/2012 1323   CO2 26 11/08/2016 1107   BUN 18.7 11/08/2016 1107   CREATININE 0.9 11/08/2016 1107      Component Value Date/Time   CALCIUM 9.4 11/08/2016 1107   ALKPHOS 110 11/08/2016 1107   AST 216 (HH) 11/08/2016 1107   ALT 413 (HH) 11/08/2016 1107   BILITOT 0.28 11/08/2016 1107       RADIOGRAPHIC STUDIES: No results found.  ASSESSMENT AND PLAN: This is a very pleasant 71 years old white female recently diagnosed in January 2011 with smoldering myeloma. She has been observation for the last 7 years with no concerning symptoms. Most recently the patient had significant progression of her disease. I had a lengthy discussion with the patient and her husband today about her current condition and treatment options. She was again given the option of continuous observation and close monitoring versus proceeding with systemic chemotherapy. The  patient is very anxious and like to start her treatment as soon as possible. I explained to the patient that the treatment option is of palliative nature but for some patient may result complete response. I recommended for her regimen consisting of Velcade 1.3 MG/M2 on days 1, 8 and 15 every 4 weeks in addition to Revlimid 25 mg by mouth daily for 3 weeks every 4 weeks as well as weekly Decadron 40 mg by mouth. I discussed with the patient and her husband the adverse effect of this treatment including the  effect of Revlimid as well as mild alopecia, peripheral neuropathy, myelosuppression, nausea and vomiting, renal and hepatic dysfunction. She is expected to start the first dose of this treatment next week. The patient will have a chemotherapy education class before starting the first dose of her treatment. I will start the patient on acyclovir 200 mg by mouth twice a day for CMV prophylaxis.I also sent a prescription for Compazine 10 mg by mouth every 6 hours as needed for nausea. She was found today to have elevated liver enzymes. These are likely drug or alcohol induced. I strongly encouraged the patient to quit alcohol  drinking and to avoid any NSAIDs. I will repeat comprehensive metabolic panel and few days to reevaluate her liver enzymes. If she continues to have persistent elevation of the liver enzymes, I would consider the patient for either ultrasound or CT scan of the abdomen for further evaluation of her condition. For anxiety, I gave the patient Xanax 0.25 mg every 8 hours as needed for anxiety. She will come back for follow-up visit in 2 weeks for evaluation and management of any adverse effect of her treatment. She was advised to call immediately if she has any concerning symptoms in the interval. The patient and her husband had several questions and I answered them completely to their satisfaction. The patient voices understanding of current disease status and treatment options and is in  agreement with the current care plan.  All questions were answered. The patient knows to call the clinic with any problems, questions or concerns. We can certainly see the patient much sooner if necessary.  I spent 30 minutes counseling the patient face to face. The total time spent in the appointment was 40 minutes.  Disclaimer: This note was dictated with voice recognition software. Similar sounding words can inadvertently be transcribed and may not be corrected upon review.

## 2016-11-08 NOTE — Telephone Encounter (Signed)
Appointments scheduled per 1/8 LOS. Patient given AVS report and calendars with future scheduled appointments.  °

## 2016-11-09 ENCOUNTER — Telehealth: Payer: Self-pay | Admitting: Medical Oncology

## 2016-11-09 LAB — KAPPA/LAMBDA LIGHT CHAINS
IG LAMBDA FREE LIGHT CHAIN: 954 mg/L — AB (ref 5.7–26.3)
Ig Kappa Free Light Chain: 7.7 mg/L (ref 3.3–19.4)
Kappa/Lambda FluidC Ratio: 0.01 — ABNORMAL LOW (ref 0.26–1.65)

## 2016-11-09 LAB — IGG, IGA, IGM
IGA/IMMUNOGLOBULIN A, SERUM: 54 mg/dL — AB (ref 87–352)
IgG, Qn, Serum: 418 mg/dL — ABNORMAL LOW (ref 700–1600)
IgM, Qn, Serum: 15 mg/dL — ABNORMAL LOW (ref 26–217)

## 2016-11-09 LAB — BETA 2 MICROGLOBULIN, SERUM: Beta-2: 3.5 mg/L — ABNORMAL HIGH (ref 0.6–2.4)

## 2016-11-09 NOTE — Telephone Encounter (Signed)
Pt called to cancel her chemo class and did not reschedule.

## 2016-11-09 NOTE — Telephone Encounter (Signed)
She can not get treatment without the class as required by the system.

## 2016-11-10 ENCOUNTER — Other Ambulatory Visit: Payer: Medicare Other

## 2016-11-10 ENCOUNTER — Telehealth: Payer: Self-pay | Admitting: Medical Oncology

## 2016-11-10 ENCOUNTER — Telehealth: Payer: Self-pay | Admitting: *Deleted

## 2016-11-10 NOTE — Telephone Encounter (Signed)
Pt requesting to get tx under the care of Dr Norma Fredrickson at [facility]. I called back Judson Roch and told her pt cancelled chemo class and that her labs were abnormal. Dr Julien Nordmann has no problem with pt going to [facility].

## 2016-11-10 NOTE — Telephone Encounter (Signed)
Per 1/8 LOS I have scheduled appt and notified the scheduler

## 2016-11-11 ENCOUNTER — Telehealth: Payer: Self-pay | Admitting: *Deleted

## 2016-11-11 ENCOUNTER — Other Ambulatory Visit (HOSPITAL_BASED_OUTPATIENT_CLINIC_OR_DEPARTMENT_OTHER): Payer: Medicare Other

## 2016-11-11 DIAGNOSIS — C9000 Multiple myeloma not having achieved remission: Secondary | ICD-10-CM

## 2016-11-11 DIAGNOSIS — D472 Monoclonal gammopathy: Secondary | ICD-10-CM

## 2016-11-11 LAB — COMPREHENSIVE METABOLIC PANEL
ALBUMIN: 3 g/dL — AB (ref 3.5–5.0)
ALK PHOS: 120 U/L (ref 40–150)
ALT: 441 U/L (ref 0–55)
ANION GAP: 8 meq/L (ref 3–11)
AST: 209 U/L (ref 5–34)
BILIRUBIN TOTAL: 0.3 mg/dL (ref 0.20–1.20)
BUN: 22.8 mg/dL (ref 7.0–26.0)
CO2: 26 mEq/L (ref 22–29)
Calcium: 9.2 mg/dL (ref 8.4–10.4)
Chloride: 105 mEq/L (ref 98–109)
Creatinine: 0.8 mg/dL (ref 0.6–1.1)
EGFR: 71 mL/min/{1.73_m2} — AB (ref >90)
Glucose: 98 mg/dl (ref 70–140)
Potassium: 4.3 mEq/L (ref 3.5–5.1)
Sodium: 138 mEq/L (ref 136–145)
TOTAL PROTEIN: 6.2 g/dL — AB (ref 6.4–8.3)

## 2016-11-11 NOTE — Telephone Encounter (Signed)
Per MD, pt liver enzyemes elevated, pt will be referred to GI for further evalutation. Called notified pt of above information, pt declined referral to " anyone other than Dr. Norma Fredrickson at [facility]"  Reconfirmed with pt per her instructions pt does not want to be referred to a GI office, instead has requested labs be faxed to Dr. Norma Fredrickson as he is now her Cancer Doctor and is handling all of her care. Pt confirmed this is correct.  Call to [place] [phone] notified Margreta Journey who took message for Dr. Norma Fredrickson            Faxed lab results to Dr. Norma Fredrickson office- [phone]

## 2016-11-15 ENCOUNTER — Other Ambulatory Visit: Payer: Medicare Other

## 2016-11-15 ENCOUNTER — Ambulatory Visit: Payer: Medicare Other

## 2016-11-18 ENCOUNTER — Other Ambulatory Visit: Payer: Medicare Other

## 2016-11-19 DIAGNOSIS — Z5111 Encounter for antineoplastic chemotherapy: Secondary | ICD-10-CM | POA: Diagnosis not present

## 2016-11-19 DIAGNOSIS — Z23 Encounter for immunization: Secondary | ICD-10-CM | POA: Diagnosis not present

## 2016-11-19 DIAGNOSIS — C9000 Multiple myeloma not having achieved remission: Secondary | ICD-10-CM | POA: Diagnosis not present

## 2016-11-22 ENCOUNTER — Ambulatory Visit: Payer: Medicare Other

## 2016-11-22 ENCOUNTER — Other Ambulatory Visit: Payer: Medicare Other

## 2016-11-22 DIAGNOSIS — Z6821 Body mass index (BMI) 21.0-21.9, adult: Secondary | ICD-10-CM | POA: Diagnosis not present

## 2016-11-22 DIAGNOSIS — Z124 Encounter for screening for malignant neoplasm of cervix: Secondary | ICD-10-CM | POA: Diagnosis not present

## 2016-11-22 DIAGNOSIS — Z779 Other contact with and (suspected) exposures hazardous to health: Secondary | ICD-10-CM | POA: Diagnosis not present

## 2016-11-24 ENCOUNTER — Ambulatory Visit: Payer: Medicare Other | Admitting: Internal Medicine

## 2016-11-24 DIAGNOSIS — C9000 Multiple myeloma not having achieved remission: Secondary | ICD-10-CM | POA: Diagnosis not present

## 2016-11-24 DIAGNOSIS — Z79899 Other long term (current) drug therapy: Secondary | ICD-10-CM | POA: Diagnosis not present

## 2016-11-24 DIAGNOSIS — Z5111 Encounter for antineoplastic chemotherapy: Secondary | ICD-10-CM | POA: Diagnosis not present

## 2016-11-24 DIAGNOSIS — Z5181 Encounter for therapeutic drug level monitoring: Secondary | ICD-10-CM | POA: Diagnosis not present

## 2016-11-25 ENCOUNTER — Ambulatory Visit
Admission: RE | Admit: 2016-11-25 | Discharge: 2016-11-25 | Disposition: A | Discharge status: Home / Self Care | Payer: Medicare Other | Source: Ambulatory Visit | Attending: Obstetrics and Gynecology | Admitting: Obstetrics and Gynecology

## 2016-11-25 DIAGNOSIS — Z1231 Encounter for screening mammogram for malignant neoplasm of breast: Secondary | ICD-10-CM

## 2016-11-29 ENCOUNTER — Ambulatory Visit: Payer: Medicare Other

## 2016-11-29 ENCOUNTER — Other Ambulatory Visit: Payer: Medicare Other

## 2016-11-29 ENCOUNTER — Ambulatory Visit: Payer: Medicare Other | Admitting: Internal Medicine

## 2016-11-29 DIAGNOSIS — C9000 Multiple myeloma not having achieved remission: Secondary | ICD-10-CM | POA: Diagnosis not present

## 2016-11-29 DIAGNOSIS — R748 Abnormal levels of other serum enzymes: Secondary | ICD-10-CM | POA: Diagnosis not present

## 2016-11-29 DIAGNOSIS — Z87891 Personal history of nicotine dependence: Secondary | ICD-10-CM | POA: Diagnosis not present

## 2016-11-29 DIAGNOSIS — M810 Age-related osteoporosis without current pathological fracture: Secondary | ICD-10-CM | POA: Diagnosis not present

## 2016-11-29 DIAGNOSIS — Z79899 Other long term (current) drug therapy: Secondary | ICD-10-CM | POA: Diagnosis not present

## 2016-11-29 DIAGNOSIS — Z7189 Other specified counseling: Secondary | ICD-10-CM | POA: Diagnosis not present

## 2016-11-29 DIAGNOSIS — D473 Essential (hemorrhagic) thrombocythemia: Secondary | ICD-10-CM | POA: Diagnosis not present

## 2016-12-01 DIAGNOSIS — C9000 Multiple myeloma not having achieved remission: Secondary | ICD-10-CM | POA: Diagnosis not present

## 2016-12-01 DIAGNOSIS — Z5111 Encounter for antineoplastic chemotherapy: Secondary | ICD-10-CM | POA: Diagnosis not present

## 2016-12-06 ENCOUNTER — Ambulatory Visit: Payer: Medicare Other

## 2016-12-06 ENCOUNTER — Other Ambulatory Visit: Payer: Medicare Other

## 2016-12-13 ENCOUNTER — Ambulatory Visit: Payer: Medicare Other

## 2016-12-15 DIAGNOSIS — C9000 Multiple myeloma not having achieved remission: Secondary | ICD-10-CM | POA: Diagnosis not present

## 2016-12-17 DIAGNOSIS — Z5181 Encounter for therapeutic drug level monitoring: Secondary | ICD-10-CM | POA: Diagnosis not present

## 2016-12-17 DIAGNOSIS — C9000 Multiple myeloma not having achieved remission: Secondary | ICD-10-CM | POA: Diagnosis not present

## 2016-12-17 DIAGNOSIS — Z5111 Encounter for antineoplastic chemotherapy: Secondary | ICD-10-CM | POA: Diagnosis not present

## 2016-12-17 DIAGNOSIS — Z79899 Other long term (current) drug therapy: Secondary | ICD-10-CM | POA: Diagnosis not present

## 2016-12-20 ENCOUNTER — Ambulatory Visit: Payer: Medicare Other

## 2016-12-22 DIAGNOSIS — C9000 Multiple myeloma not having achieved remission: Secondary | ICD-10-CM | POA: Diagnosis not present

## 2016-12-22 DIAGNOSIS — Z5111 Encounter for antineoplastic chemotherapy: Secondary | ICD-10-CM | POA: Diagnosis not present

## 2016-12-23 DIAGNOSIS — K716 Toxic liver disease with hepatitis, not elsewhere classified: Secondary | ICD-10-CM | POA: Diagnosis not present

## 2016-12-23 DIAGNOSIS — K759 Inflammatory liver disease, unspecified: Secondary | ICD-10-CM | POA: Diagnosis not present

## 2016-12-23 DIAGNOSIS — K732 Chronic active hepatitis, not elsewhere classified: Secondary | ICD-10-CM | POA: Diagnosis not present

## 2016-12-23 DIAGNOSIS — R5383 Other fatigue: Secondary | ICD-10-CM | POA: Diagnosis not present

## 2016-12-23 DIAGNOSIS — R7989 Other specified abnormal findings of blood chemistry: Secondary | ICD-10-CM | POA: Diagnosis not present

## 2016-12-23 DIAGNOSIS — C9000 Multiple myeloma not having achieved remission: Secondary | ICD-10-CM | POA: Diagnosis not present

## 2016-12-29 DIAGNOSIS — C9000 Multiple myeloma not having achieved remission: Secondary | ICD-10-CM | POA: Diagnosis not present

## 2017-01-05 DIAGNOSIS — Z5181 Encounter for therapeutic drug level monitoring: Secondary | ICD-10-CM | POA: Diagnosis not present

## 2017-01-05 DIAGNOSIS — Z5111 Encounter for antineoplastic chemotherapy: Secondary | ICD-10-CM | POA: Diagnosis not present

## 2017-01-05 DIAGNOSIS — R7989 Other specified abnormal findings of blood chemistry: Secondary | ICD-10-CM | POA: Diagnosis not present

## 2017-01-05 DIAGNOSIS — C9000 Multiple myeloma not having achieved remission: Secondary | ICD-10-CM | POA: Diagnosis not present

## 2017-01-05 DIAGNOSIS — Z79899 Other long term (current) drug therapy: Secondary | ICD-10-CM | POA: Diagnosis not present

## 2017-01-12 ENCOUNTER — Encounter: Payer: Self-pay | Admitting: Neurology

## 2017-01-12 ENCOUNTER — Ambulatory Visit (INDEPENDENT_AMBULATORY_CARE_PROVIDER_SITE_OTHER): Payer: Medicare Other | Admitting: Neurology

## 2017-01-12 VITALS — BP 80/58 | HR 78 | Temp 98.2°F | Resp 16 | Ht 63.0 in | Wt 122.0 lb

## 2017-01-12 DIAGNOSIS — G40B09 Juvenile myoclonic epilepsy, not intractable, without status epilepticus: Secondary | ICD-10-CM

## 2017-01-12 DIAGNOSIS — Z5181 Encounter for therapeutic drug level monitoring: Secondary | ICD-10-CM | POA: Diagnosis not present

## 2017-01-12 DIAGNOSIS — Z5111 Encounter for antineoplastic chemotherapy: Secondary | ICD-10-CM | POA: Diagnosis not present

## 2017-01-12 DIAGNOSIS — Z79899 Other long term (current) drug therapy: Secondary | ICD-10-CM | POA: Diagnosis not present

## 2017-01-12 DIAGNOSIS — C9000 Multiple myeloma not having achieved remission: Secondary | ICD-10-CM | POA: Diagnosis not present

## 2017-01-12 MED ORDER — LEVETIRACETAM 500 MG PO TABS: 500.0000 mg | ORAL_TABLET | Freq: Two times a day (BID) | ORAL | 3 refills | Status: DC

## 2017-01-12 NOTE — Progress Notes (Signed)
NEUROLOGY CONSULTATION NOTE  LOVELYN SHEERAN MRN: [id number] DOB: 07-13-1946  Referring provider: Dr. Carol Ada Primary care provider: Dr. Carol Ada  Reason for consult:  Seizure medication management  Dear Dr Tamala Julian:  Thank you for your kind referral of GLENNA BRUNKOW for consultation of the above symptoms. Although her history is well known to you, please allow me to reiterate it for the purpose of our medical record. The patient was accompanied to the clinic by her husband who also provides collateral information. Records and images were personally reviewed where available.  HISTORY OF PRESENT ILLNESS: This is a pleasant 71 year old woman with a history of multiple myeloma, hypertension, juvenile myoclonic epilepsy,presenting for medication evaluation. She had her first seizure in the 9th grade, at that time attributed to sleep deprivation and stress. She had her second seizure in 1972, and had several more until 1987. She started Dilantin in 1976 and would have breakthrough seizures due to medication compliance. She reports seizures always started with myoclonic jerks that would proceed to a convulsion. She denies any myoclonic jerks or convulsions since 1987, she has been very compliant with Dilantin 279m in AM, 1013min PM with no side effects. She and her husband deny any staring episodes. She recalls one time in 1987 when she missed doses of Dilantin, she was having some cognitive changes, then came to driving to her parents' house with no recollection of events. This has not recurred since then. She denies any olfactory/gustatory hallucinations, deja vu, rising epigastric sensation, focal numbness/tingling/weakness. She has been undergoing treatment for multiple myeloma, and was noted to have pancytopenia due to interaction of lenalidomide with her Dilantin. Per records, levels have started to recover. [place], instructed to stop Dilantin and start  Keppra. She has been off Dilantin for a week and taking Keppra 50058mID with no side effects. She denies any myoclonic jerks or convulsions. She has minimized driving. She denies any headaches, dizziness, diplopia, dysarthria/dysphagia, neck/back pain, bowel/bladder dysfunction. No falls.  Epilepsy Risk Factors:  Her son started had 2 seizures as a teenager and is also taking Keppra. Otherwise she had a normal birth and early development.  There is no history of febrile convulsions, CNS infections such as meningitis/encephalitis, significant traumatic brain injury, neurosurgical procedures.  PAST MEDICAL HISTORY: Past Medical History:  Diagnosis Date  . Anxiety   . Colon adenoma   . Degenerative disk disease   . Epilepsy ([facility]  last seizure 1987  . GERD (gastroesophageal reflux disease)   . Hypertension   . IgG monoclonal gammopathy   . Iron deficiency anemia   . Multiple [facility]oma ([facility]/06/2017  . MVP (mitral valve prolapse)   . Osteoporosis   . Thrombocytosis ([facility]   PAST SURGICAL HISTORY: Past Surgical History:  Procedure Laterality Date  . BREAST BIOPSY  1992   benign  . TONSILLECTOMY  1961    MEDICATIONS: Current Outpatient Prescriptions on File Prior to Visit  Medication Sig Dispense Refill  . acyclovir (ZOVIRAX) 200 MG capsule Take 1 capsule (200 mg total) by mouth 2 (two) times daily. 60 capsule 2  . allopurinol (ZYLOPRIM) 300 MG tablet Take 300 mg by mouth daily.  0  . ALPRAZolam (XANAX) 0.25 MG tablet Take 1 tablet (0.25 mg total) by mouth every 8 (eight) hours as needed. 30 tablet 1  . Calcium-Vitamin D-Vitamin K 500-100-40 MG-UNT-MCG CHEW Chew 2 capsules by mouth daily.    .Marland Kitchen  dexamethasone (DECADRON) 4 MG tablet 10 tab po weekly with chemo 40 tablet 4  . docusate sodium (COLACE) 100 MG capsule Take 100 mg by mouth 2 (two) times daily.    Marland Kitchen escitalopram (LEXAPRO) 20 MG tablet Take 20 mg by mouth daily.    . ferrous sulfate 325 (65 FE) MG tablet Take 325 mg by  mouth daily with breakfast.    . fish oil-omega-3 fatty acids 1000 MG capsule Take 1 g by mouth daily.    Marland Kitchen HYDROcodone-acetaminophen (NORCO) 10-325 MG tablet     . Multiple Vitamin (MULTIVITAMIN) tablet Take 1 tablet by mouth daily.    . Omeprazole Magnesium (PRILOSEC OTC PO) Take by mouth daily.    . phenytoin (DILANTIN) 100 MG ER capsule Take by mouth 2 (two) times daily. 2 capsules in the am and 1 capsule in the pm    . Probiotic Product (PROBIOTIC DAILY PO) Take by mouth daily.    Marland Kitchen tretinoin (RETIN-A) 0.1 % cream Apply 1 application topically daily.  4  . valsartan (DIOVAN) 80 MG tablet Take 80 mg by mouth daily.  1   No current facility-administered medications on file prior to visit.     ALLERGIES: No Known Allergies  FAMILY HISTORY: Family History  Problem Relation Age of Onset  . Nephrolithiasis Mother   . Alzheimer's disease Mother   . Ulcers Mother   . Prostate cancer Father   . Multiple myeloma Father   . Colon cancer Neg Hx     SOCIAL HISTORY: Social History   Social History  . Marital status: Married    Spouse name: N/A  . Number of children: N/A  . Years of education: N/A   Occupational History  . Not on file.   Social History Main Topics  . Smoking status: Former Smoker    Quit date: 06/21/1979  . Smokeless tobacco: Never Used  . Alcohol use 1.2 oz/week    2 Glasses of wine per week  . Drug use: No  . Sexual activity: Yes   Other Topics Concern  . Not on file   Social History Narrative  . No narrative on file    REVIEW OF SYSTEMS: Constitutional: No fevers, chills, or sweats, no generalized fatigue, change in appetite Eyes: No visual changes, double vision, eye pain Ear, nose and throat: No hearing loss, ear pain, nasal congestion, sore throat Cardiovascular: No chest pain, palpitations Respiratory:  No shortness of breath at rest or with exertion, wheezes GastrointestinaI: No nausea, vomiting, diarrhea, abdominal pain, fecal  incontinence Genitourinary:  No dysuria, urinary retention or frequency Musculoskeletal:  No neck pain, back pain Integumentary: No rash, pruritus, skin lesions Neurological: as above Psychiatric: No depression, insomnia, anxiety Endocrine: No palpitations, fatigue, diaphoresis, mood swings, change in appetite, change in weight, increased thirst Hematologic/Lymphatic:  No anemia, purpura, petechiae. Allergic/Immunologic: no itchy/runny eyes, nasal congestion, recent allergic reactions, rashes  PHYSICAL EXAM: Vitals:   01/12/17 1458  BP: (!) 80/58  Pulse: 78  Resp: 16  Temp: 98.2 F (36.8 C)   General: No acute distress Head:  Normocephalic/atraumatic Eyes: Fundoscopic exam shows bilateral sharp discs, no vessel changes, exudates, or hemorrhages Neck: supple, no paraspinal tenderness, full range of motion Back: No paraspinal tenderness Heart: regular rate and rhythm Lungs: Clear to auscultation bilaterally. Vascular: No carotid bruits. Skin/Extremities: No rash, no edema Neurological Exam: Mental status: alert and oriented to person, place, and time, no dysarthria or aphasia, Fund of knowledge is appropriate.  Recent and remote memory  are intact. 3/3 delayed recall. Attention and concentration are normal.    Able to name objects and repeat phrases. Named 16 words starting with F in 1 minute (normal >11). Cranial nerves: CN I: not tested CN II: pupils equal, round and reactive to light, visual fields intact, fundi unremarkable. CN III, IV, VI:  full range of motion, no nystagmus, no ptosis CN V: facial sensation intact CN VII: upper and lower face symmetric CN VIII: hearing intact to finger rub CN IX, X: gag intact, uvula midline CN XI: sternocleidomastoid and trapezius muscles intact CN XII: tongue midline Bulk & Tone: normal, no fasciculations. Motor: 5/5 throughout with no pronator drift. Sensation: intact to light touch, cold, pin, vibration and joint position sense.  No  extinction to double simultaneous stimulation.  Romberg test negative Deep Tendon Reflexes: +2 throughout, no ankle clonus Plantar responses: downgoing bilaterally Cerebellar: no incoordination on finger to nose, heel to shin. No dysdiadochokinesia Gait: narrow-based and steady, able to tandem walk adequately. Tremor: none  IMPRESSION: This is a pleasant 71 year old right-handed woman with a history of juvenile myoclonic epilepsy, on long-term therapy with Dilantin, which was stopped last week and switched to Keppra 566m BID due to pancytopenia from interaction of Dilantin with lenalidomide. Per notes, counts are recovering. She denies any seizures or seizure-like recurrence with switch to Keppra, no side effects on the new medication. We discussed risk for breakthrough seizure with any medication adjustment, would minimize driving for now. [place] driving laws were discussed with the patient, and she knows to stop driving after a seizure, until 6 months seizure-free. Refills for Keppra were sent, she will follow-up in 6 months and knows to call for any changes.   Thank you for allowing me to participate in the care of this patient. Please do not hesitate to call for any questions or concerns.   KEllouise Newer M.D.  CC: Dr. STamala Julian Dr. RMelburn Hake

## 2017-01-12 NOTE — Patient Instructions (Signed)
1. Continue Keppra 500mg  twice a day 2. Minimize driving for the first 2 weeks 3. Follow-up in 6 months, call for any changes  Seizure Precautions: 1. If medication has been prescribed for you to prevent seizures, take it exactly as directed.  Do not stop taking the medicine without talking to your doctor first, even if you have not had a seizure in a long time.   2. Avoid activities in which a seizure would cause danger to yourself or to others.  Don't operate dangerous machinery, swim alone, or climb in high or dangerous places, such as on ladders, roofs, or girders.  Do not drive unless your doctor says you may.  3. If you have any warning that you may have a seizure, lay down in a safe place where you can't hurt yourself.    4.  No driving for 6 months from last seizure, as per [facility].   Please refer to the following link on the [place] website for more information: http://[email or web address]/[email or web address]/drivingu.cfm   5.  Maintain good sleep hygiene. Avoid alcohol  6.  Contact your doctor if you have any problems that may be related to the medicine you are taking.  7.  Call 911 and bring the patient back to the ED if:        A.  The seizure lasts longer than 5 minutes.       B.  The patient doesn't awaken shortly after the seizure  C.  The patient has new problems such as difficulty seeing, speaking or moving  D.  The patient was injured during the seizure  E.  The patient has a temperature over 102 F (39C)  F.  The patient vomited and now is having trouble breathing

## 2017-01-19 DIAGNOSIS — Z5181 Encounter for therapeutic drug level monitoring: Secondary | ICD-10-CM | POA: Diagnosis not present

## 2017-01-19 DIAGNOSIS — Z79899 Other long term (current) drug therapy: Secondary | ICD-10-CM | POA: Diagnosis not present

## 2017-01-19 DIAGNOSIS — C9000 Multiple myeloma not having achieved remission: Secondary | ICD-10-CM | POA: Diagnosis not present

## 2017-01-19 DIAGNOSIS — Z5111 Encounter for antineoplastic chemotherapy: Secondary | ICD-10-CM | POA: Diagnosis not present

## 2017-01-26 DIAGNOSIS — Z5111 Encounter for antineoplastic chemotherapy: Secondary | ICD-10-CM | POA: Diagnosis not present

## 2017-01-26 DIAGNOSIS — C9000 Multiple myeloma not having achieved remission: Secondary | ICD-10-CM | POA: Diagnosis not present

## 2017-02-07 DIAGNOSIS — Z5181 Encounter for therapeutic drug level monitoring: Secondary | ICD-10-CM | POA: Diagnosis not present

## 2017-02-07 DIAGNOSIS — Z5111 Encounter for antineoplastic chemotherapy: Secondary | ICD-10-CM | POA: Diagnosis not present

## 2017-02-07 DIAGNOSIS — C9000 Multiple myeloma not having achieved remission: Secondary | ICD-10-CM | POA: Diagnosis not present

## 2017-02-07 DIAGNOSIS — Z79899 Other long term (current) drug therapy: Secondary | ICD-10-CM | POA: Diagnosis not present

## 2017-02-07 NOTE — Telephone Encounter (Signed)
error 

## 2017-02-13 ENCOUNTER — Emergency Department (HOSPITAL_COMMUNITY)
Admission: EM | Admit: 2017-02-13 | Discharge: 2017-02-13 | Disposition: A | Discharge status: Home / Self Care | Payer: Medicare Other | Attending: Emergency Medicine | Admitting: Emergency Medicine

## 2017-02-13 DIAGNOSIS — Z7982 Long term (current) use of aspirin: Secondary | ICD-10-CM | POA: Insufficient documentation

## 2017-02-13 DIAGNOSIS — G40909 Epilepsy, unspecified, not intractable, without status epilepticus: Secondary | ICD-10-CM | POA: Insufficient documentation

## 2017-02-13 DIAGNOSIS — I10 Essential (primary) hypertension: Secondary | ICD-10-CM | POA: Diagnosis not present

## 2017-02-13 DIAGNOSIS — R569 Unspecified convulsions: Secondary | ICD-10-CM

## 2017-02-13 DIAGNOSIS — Z8579 Personal history of other malignant neoplasms of lymphoid, hematopoietic and related tissues: Secondary | ICD-10-CM | POA: Diagnosis not present

## 2017-02-13 DIAGNOSIS — Z87891 Personal history of nicotine dependence: Secondary | ICD-10-CM | POA: Diagnosis not present

## 2017-02-13 MED ORDER — LEVETIRACETAM 500 MG PO TABS
1000.0000 mg | ORAL_TABLET | Freq: Once | ORAL | Status: AC
  Administered 2017-02-13: 1000 mg via ORAL
  Filled 2017-02-13: qty 2

## 2017-02-13 MED ORDER — LEVETIRACETAM 1000 MG PO TABS: 1000.0000 mg | ORAL_TABLET | Freq: Two times a day (BID) | ORAL | 0 refills | Status: DC

## 2017-02-13 NOTE — ED Provider Notes (Signed)
[facility] DEPT Provider Note   CSN: [id number] Arrival date & time: 02/13/17  1554     History   Chief Complaint Chief Complaint  Patient presents with  . Aphasia  . Seizures    HPI Catherine Bullock is a 71 y.o. female.  HPI:  Chief complaint is possible seizure/difficulty thinking.  This is a 71 year old female with a diagnosis of juvenile myoclonic epilepsy since age 31. Has been on Dilantin for over 30 years and has not had breakthrough seizures since 1987. Interestingly, her most recent seizure and 34 is similar to today's episode. She had an period of time where she was confused. She was able to drive from one place to the other and was confused upon arrival and had no memory for the drive over and her symptoms cleared within about an hour. Typically, her seizures have been localized myoclonic jerking with secondary generalization.  Patient left church today. They went to [facility]. She had difficulty getting out of the car to go in by herself to order. Had difficulty ordering as she stated she could just not to think of what she wanted to eat which is unusual for her. They went home. They were changing to go to a ballgame. She came out in her underwear only and stated that she was raising ago. Husband had explained her that she had to get dressed. Within about 2 hours is resolved.  Patient had been on Dilantin seizure-free for many years. However has been undergoing treatment for multiple myeloma which is been successful. No CNS involvement. However she became pancytopenic with interaction between her chemotherapeutic agent and her Dilantin and was changed from Dilantin, 2 Keppra with a first time appointment with Dr. Delice Lesch of [facility] 3/18.  Past Medical History:  Diagnosis Date  . Anxiety   . Colon adenoma   . Degenerative disk disease   . Epilepsy ([place])    last seizure 1987  . GERD (gastroesophageal reflux disease)   . Hypertension   . IgG monoclonal gammopathy     . Iron deficiency anemia   . Multiple myeloma ([place]) 11/08/2016  . MVP (mitral valve prolapse)   . Osteoporosis   . Thrombocytosis [facility])     Patient Active Problem List   Diagnosis Date Noted  . Nonintractable juvenile myoclonic epilepsy without status epilepticus ([place]) 01/12/2017  . Multiple myeloma ([place]) 11/08/2016  . Anxiety 11/08/2016  . Liver dysfunction 11/08/2016  . Leukopenia 12/14/2011  . MGUS (monoclonal gammopathy of unknown significance) 12/14/2011  . Anemia, iron deficiency 12/14/2011    Past Surgical History:  Procedure Laterality Date  . BREAST BIOPSY  1992   benign  . TONSILLECTOMY  1961    OB History    No data available       Home Medications    Prior to Admission medications   Medication Sig Start Date End Date Taking? Authorizing Provider  acyclovir (ZOVIRAX) 200 MG capsule Take 1 capsule (200 mg total) by mouth 2 (two) times daily. 11/08/16   Curt Bears, MD  allopurinol (ZYLOPRIM) 300 MG tablet Take 300 mg by mouth daily. 11/04/16   Historical Provider, MD  ALPRAZolam Duanne Moron) 0.25 MG tablet Take 1 tablet (0.25 mg total) by mouth every 8 (eight) hours as needed. 11/08/16   Curt Bears, MD  aspirin EC 81 MG tablet Take 81 mg by mouth daily. 11/29/16   Historical Provider, MD  Calcium-Vitamin D-Vitamin K 500-100-40 MG-UNT-MCG CHEW Chew 2 capsules by mouth daily.    Historical Provider,  MD  dexamethasone (DECADRON) 4 MG tablet 10 tab po weekly with chemo 11/08/16   Curt Bears, MD  docusate sodium (COLACE) 100 MG capsule Take 100 mg by mouth 2 (two) times daily.    Historical Provider, MD  escitalopram (LEXAPRO) 20 MG tablet Take 20 mg by mouth daily.    Historical Provider, MD  ferrous sulfate 325 (65 FE) MG tablet Take 325 mg by mouth daily with breakfast.    Historical Provider, MD  fish oil-omega-3 fatty acids 1000 MG capsule Take 1 g by mouth daily.    Historical Provider, MD  HYDROcodone-acetaminophen [facility]) 10-325 MG tablet  08/03/16    Historical Provider, MD  levETIRAcetam (KEPPRA) 1000 MG tablet Take 1 tablet (1,000 mg total) by mouth 2 (two) times daily. 02/13/17   Tanna Furry, MD  Multiple Vitamin (MULTIVITAMIN) tablet Take 1 tablet by mouth daily.    Historical Provider, MD  Omeprazole Magnesium (PRILOSEC OTC PO) Take by mouth daily.    Historical Provider, MD  Probiotic Product (PROBIOTIC DAILY PO) Take by mouth daily.    Historical Provider, MD  REVLIMID 25 MG capsule Take 25 mg by mouth daily. 11/19/16   Historical Provider, MD  tretinoin (RETIN-A) 0.1 % cream Apply 1 application topically daily. 12/16/15   Historical Provider, MD  valsartan (DIOVAN) 80 MG tablet Take 80 mg by mouth daily. 12/25/15   Historical Provider, MD    Family History Family History  Problem Relation Age of Onset  . Nephrolithiasis Mother   . Alzheimer's disease Mother   . Ulcers Mother   . Prostate cancer Father   . Multiple myeloma Father   . Colon cancer Neg Hx     Social History Social History  Substance Use Topics  . Smoking status: Former Smoker    Quit date: 06/21/1979  . Smokeless tobacco: Never Used  . Alcohol use 1.2 oz/week    2 Glasses of wine per week     Allergies   Patient has no known allergies.   Review of Systems Review of Systems  Constitutional: Negative for appetite change, chills, diaphoresis, fatigue and fever.  HENT: Negative for mouth sores, sore throat and trouble swallowing.   Eyes: Negative for visual disturbance.  Respiratory: Negative for cough, chest tightness, shortness of breath and wheezing.   Cardiovascular: Negative for chest pain.  Gastrointestinal: Negative for abdominal distention, abdominal pain, diarrhea, nausea and vomiting.  Endocrine: Negative for polydipsia, polyphagia and polyuria.  Genitourinary: Negative for dysuria, frequency and hematuria.  Musculoskeletal: Negative for gait problem.  Skin: Negative for color change, pallor and rash.  Neurological: Negative for dizziness,  syncope, light-headedness and headaches.  Hematological: Does not bruise/bleed easily.  Psychiatric/Behavioral: Positive for confusion. Negative for behavioral problems.     Physical Exam Updated Vital Signs BP (!) 153/125   Pulse 64   Ht 5' 3"  (1.6 m)   Wt 120 lb (54.4 kg)   SpO2 100%   BMI 21.26 kg/m   Physical Exam  Constitutional: She is oriented to person, place, and time. She appears well-developed and well-nourished. No distress.  HENT:  Head: Normocephalic.  Eyes: Conjunctivae are normal. Pupils are equal, round, and reactive to light. No scleral icterus.  Neck: Normal range of motion. Neck supple. No thyromegaly present.  Cardiovascular: Normal rate and regular rhythm.  Exam reveals no gallop and no friction rub.   No murmur heard. Pulmonary/Chest: Effort normal and breath sounds normal. No respiratory distress. She has no wheezes. She has no rales.  Abdominal: Soft. Bowel sounds are normal. She exhibits no distension. There is no tenderness. There is no rebound.  Musculoskeletal: Normal range of motion.  Neurological: She is alert and oriented to person, place, and time.  Cranial nerve examination. Awake and alert and mentating well. Normal memory. Oriented 3.  Normal symmetric Strength to shoulder shrug, triceps, biceps, grip,wrist flex/extend,and intrinsics  Norma lsymmetric sensation above and below clavicles, and to all distributions to UEs. Norma symmetric strength to flex/.extend hip and knees, dorsi/plantar flex ankles. Normal symmetric sensation to all distributions to LEs Patellar and achilles reflexes 1-2+. Downgoing Babinski   Skin: Skin is warm and dry. No rash noted.  Psychiatric: She has a normal mood and affect. Her behavior is normal.     ED Treatments / Results  Labs (all labs ordered are listed, but only abnormal results are displayed) Labs Reviewed - No data to display  EKG  EKG Interpretation None       Radiology No results  found.  Procedures Procedures (including critical care time)  Medications Ordered in ED Medications  levETIRAcetam (KEPPRA) tablet 1,000 mg (not administered)     Initial Impression / Assessment and Plan / ED Course  I have reviewed the triage vital signs and the nursing notes.  Pertinent labs & imaging results that were available during my care of the patient were reviewed by me and considered in my medical decision making (see chart for details).     Seizure today sounds more like a complex partial seizure with postictal period now resolved. I discussed the case with neurology on-call for Dr. Delice Lesch. We were in agreement. We will increase Keppra to 1 g twice a day. Outpatient MRI. Follow up with Dr. Delice Lesch.     Final diagnoses:  Seizure ([place])  Seizure disorder (HCC)    New Prescriptions New Prescriptions   LEVETIRACETAM (KEPPRA) 1000 MG TABLET    Take 1 tablet (1,000 mg total) by mouth 2 (two) times daily.     Tanna Furry, MD 02/13/17 [phone]

## 2017-02-13 NOTE — Discharge Instructions (Signed)
Follow up with Dr. Delice Lesch. You may need an EEG, as this seizure if different than typical for Juvenile Myoclonic Epilepsy. Increase Keppra to 1000mg  am and pm.

## 2017-02-13 NOTE — ED Triage Notes (Signed)
Pt from EMS with CC of expressive aphasia. Per EMS, pt was at [facility] when she was unable to "get the words out" to order her drink. Pt reports this difficulty finding words last until about 1430-1500 today. Pt has hx of seizures and reports this is her typical seizure presentation. Pt last seizure in 1987, pt compliant with dilantin medications, was recently changed to [place] approx 30 days ago. Per EMS, 12 lead unremarkable. 134/76, 60 bpm. 18 G L AC. Denies weakness, slurred speech, facial palsy, vision changes, difficulty walking, or any other neuro deficits.

## 2017-02-13 NOTE — ED Notes (Signed)
ED Provider at bedside. 

## 2017-02-14 ENCOUNTER — Telehealth: Payer: Self-pay | Admitting: Neurology

## 2017-02-14 DIAGNOSIS — R569 Unspecified convulsions: Secondary | ICD-10-CM

## 2017-02-14 NOTE — Telephone Encounter (Signed)
Spoke with patient. MR ordered. She will call back to schedule follow up once she has an MR date.

## 2017-02-14 NOTE — Telephone Encounter (Signed)
Caller: PT  Urgent? Yes  Reason for the call: PT called and is a PT of Dr Aquino's and was in the emergency room last night and they suggested she contact Dr Briant Sites

## 2017-02-14 NOTE — Telephone Encounter (Signed)
From ER notes:   "Seizure today sounds more like a complex partial seizure with postictal period now resolved. I discussed the case with neurology on-call for Dr. Delice Lesch. We were in agreement. We will increase Keppra to 1 g twice a day. Outpatient MRI. Follow up with Dr. Delice Lesch."  No MR scheduled. Please advise specifics so we can order and how soon she should see Dr. Delice Lesch.

## 2017-02-14 NOTE — Telephone Encounter (Signed)
MRI brain wwo contrast - epilepsy protocol.  Return to clinic for f/u after imaging.

## 2017-02-16 DIAGNOSIS — Z5181 Encounter for therapeutic drug level monitoring: Secondary | ICD-10-CM | POA: Diagnosis not present

## 2017-02-16 DIAGNOSIS — Z5111 Encounter for antineoplastic chemotherapy: Secondary | ICD-10-CM | POA: Diagnosis not present

## 2017-02-16 DIAGNOSIS — C9000 Multiple myeloma not having achieved remission: Secondary | ICD-10-CM | POA: Diagnosis not present

## 2017-02-16 DIAGNOSIS — Z79899 Other long term (current) drug therapy: Secondary | ICD-10-CM | POA: Diagnosis not present

## 2017-02-23 ENCOUNTER — Ambulatory Visit
Admission: RE | Admit: 2017-02-23 | Discharge: 2017-02-23 | Disposition: A | Discharge status: Home / Self Care | Payer: Medicare Other | Source: Ambulatory Visit | Attending: Neurology | Admitting: Neurology

## 2017-02-23 DIAGNOSIS — Z79899 Other long term (current) drug therapy: Secondary | ICD-10-CM | POA: Diagnosis not present

## 2017-02-23 DIAGNOSIS — R569 Unspecified convulsions: Secondary | ICD-10-CM | POA: Diagnosis not present

## 2017-02-23 DIAGNOSIS — Z5111 Encounter for antineoplastic chemotherapy: Secondary | ICD-10-CM | POA: Diagnosis not present

## 2017-02-23 DIAGNOSIS — C9000 Multiple myeloma not having achieved remission: Secondary | ICD-10-CM | POA: Diagnosis not present

## 2017-02-23 DIAGNOSIS — Z5181 Encounter for therapeutic drug level monitoring: Secondary | ICD-10-CM | POA: Diagnosis not present

## 2017-02-23 MED ORDER — GADOBENATE DIMEGLUMINE 529 MG/ML IV SOLN
10.0000 mL | Freq: Once | INTRAVENOUS | Status: AC | PRN
  Administered 2017-02-23: 10 mL via INTRAVENOUS

## 2017-02-26 ENCOUNTER — Other Ambulatory Visit: Payer: Medicare Other

## 2017-02-28 ENCOUNTER — Other Ambulatory Visit: Payer: Medicare Other

## 2017-03-09 DIAGNOSIS — Z5111 Encounter for antineoplastic chemotherapy: Secondary | ICD-10-CM | POA: Diagnosis not present

## 2017-03-09 DIAGNOSIS — D473 Essential (hemorrhagic) thrombocythemia: Secondary | ICD-10-CM | POA: Diagnosis not present

## 2017-03-09 DIAGNOSIS — D61818 Other pancytopenia: Secondary | ICD-10-CM | POA: Diagnosis not present

## 2017-03-09 DIAGNOSIS — Z87891 Personal history of nicotine dependence: Secondary | ICD-10-CM | POA: Diagnosis not present

## 2017-03-09 DIAGNOSIS — R748 Abnormal levels of other serum enzymes: Secondary | ICD-10-CM | POA: Diagnosis not present

## 2017-03-09 DIAGNOSIS — C9000 Multiple myeloma not having achieved remission: Secondary | ICD-10-CM | POA: Diagnosis not present

## 2017-03-09 DIAGNOSIS — Z7982 Long term (current) use of aspirin: Secondary | ICD-10-CM | POA: Diagnosis not present

## 2017-03-11 ENCOUNTER — Telehealth: Payer: Self-pay

## 2017-03-11 NOTE — Telephone Encounter (Signed)
LMOM asking pt to return my call for resent MRI results

## 2017-03-11 NOTE — Telephone Encounter (Signed)
-----   Message from Cameron Sprang, MD sent at 03/11/2017  8:47 AM EDT ----- Pls let her know MRI brain was normal, no evidence of tumor, stroke, or bleed. Thanks

## 2017-03-14 ENCOUNTER — Other Ambulatory Visit: Payer: Self-pay | Admitting: Neurology

## 2017-03-14 DIAGNOSIS — J069 Acute upper respiratory infection, unspecified: Secondary | ICD-10-CM | POA: Diagnosis not present

## 2017-03-14 DIAGNOSIS — D899 Disorder involving the immune mechanism, unspecified: Secondary | ICD-10-CM | POA: Diagnosis not present

## 2017-03-14 MED ORDER — LEVETIRACETAM 1000 MG PO TABS: 1000.0000 mg | ORAL_TABLET | Freq: Two times a day (BID) | ORAL | 1 refills | Status: DC

## 2017-03-16 DIAGNOSIS — Z79899 Other long term (current) drug therapy: Secondary | ICD-10-CM | POA: Diagnosis not present

## 2017-03-16 DIAGNOSIS — Z5181 Encounter for therapeutic drug level monitoring: Secondary | ICD-10-CM | POA: Diagnosis not present

## 2017-03-16 DIAGNOSIS — Z5111 Encounter for antineoplastic chemotherapy: Secondary | ICD-10-CM | POA: Diagnosis not present

## 2017-03-16 DIAGNOSIS — C9000 Multiple myeloma not having achieved remission: Secondary | ICD-10-CM | POA: Diagnosis not present

## 2017-03-17 ENCOUNTER — Telehealth: Payer: Self-pay

## 2017-03-17 DIAGNOSIS — C9001 Multiple myeloma in remission: Secondary | ICD-10-CM | POA: Diagnosis not present

## 2017-03-17 DIAGNOSIS — R05 Cough: Secondary | ICD-10-CM | POA: Diagnosis not present

## 2017-03-17 NOTE — Telephone Encounter (Signed)
-----   Message from Cameron Sprang, MD sent at 03/11/2017  8:47 AM EDT ----- Pls let her know MRI brain was normal, no evidence of tumor, stroke, or bleed. Thanks

## 2017-03-17 NOTE — Telephone Encounter (Signed)
LMOM again, asking pt to call our office for resent MRI results.

## 2017-03-23 DIAGNOSIS — C9000 Multiple myeloma not having achieved remission: Secondary | ICD-10-CM | POA: Diagnosis not present

## 2017-03-23 DIAGNOSIS — Z136 Encounter for screening for cardiovascular disorders: Secondary | ICD-10-CM | POA: Diagnosis not present

## 2017-03-23 DIAGNOSIS — Z01818 Encounter for other preprocedural examination: Secondary | ICD-10-CM | POA: Diagnosis not present

## 2017-03-23 DIAGNOSIS — Z5112 Encounter for antineoplastic immunotherapy: Secondary | ICD-10-CM | POA: Diagnosis not present

## 2017-03-23 DIAGNOSIS — D499 Neoplasm of unspecified behavior of unspecified site: Secondary | ICD-10-CM | POA: Diagnosis not present

## 2017-03-23 DIAGNOSIS — Z01810 Encounter for preprocedural cardiovascular examination: Secondary | ICD-10-CM | POA: Diagnosis not present

## 2017-03-30 DIAGNOSIS — Z1371 Encounter for nonprocreative screening for genetic disease carrier status: Secondary | ICD-10-CM | POA: Diagnosis not present

## 2017-03-30 DIAGNOSIS — D464 Refractory anemia, unspecified: Secondary | ICD-10-CM | POA: Diagnosis not present

## 2017-03-30 DIAGNOSIS — C9000 Multiple myeloma not having achieved remission: Secondary | ICD-10-CM | POA: Diagnosis not present

## 2017-03-30 DIAGNOSIS — Z01818 Encounter for other preprocedural examination: Secondary | ICD-10-CM | POA: Diagnosis not present

## 2017-04-04 DIAGNOSIS — Z5112 Encounter for antineoplastic immunotherapy: Secondary | ICD-10-CM | POA: Diagnosis not present

## 2017-04-04 DIAGNOSIS — Z136 Encounter for screening for cardiovascular disorders: Secondary | ICD-10-CM | POA: Diagnosis not present

## 2017-04-04 DIAGNOSIS — D499 Neoplasm of unspecified behavior of unspecified site: Secondary | ICD-10-CM | POA: Diagnosis not present

## 2017-04-04 DIAGNOSIS — R0602 Shortness of breath: Secondary | ICD-10-CM | POA: Diagnosis not present

## 2017-04-04 DIAGNOSIS — C9000 Multiple myeloma not having achieved remission: Secondary | ICD-10-CM | POA: Diagnosis not present

## 2017-04-04 DIAGNOSIS — R0689 Other abnormalities of breathing: Secondary | ICD-10-CM | POA: Diagnosis not present

## 2017-04-04 DIAGNOSIS — I340 Nonrheumatic mitral (valve) insufficiency: Secondary | ICD-10-CM | POA: Diagnosis not present

## 2017-04-04 DIAGNOSIS — Z01818 Encounter for other preprocedural examination: Secondary | ICD-10-CM | POA: Diagnosis not present

## 2017-04-04 DIAGNOSIS — Z01810 Encounter for preprocedural cardiovascular examination: Secondary | ICD-10-CM | POA: Diagnosis not present

## 2017-04-05 ENCOUNTER — Telehealth: Payer: Self-pay | Admitting: Medical Oncology

## 2017-04-05 NOTE — Telephone Encounter (Signed)
Faxed cytogenetics and fish results to [facility].

## 2017-04-06 DIAGNOSIS — Z5111 Encounter for antineoplastic chemotherapy: Secondary | ICD-10-CM | POA: Diagnosis not present

## 2017-04-06 DIAGNOSIS — Z01812 Encounter for preprocedural laboratory examination: Secondary | ICD-10-CM | POA: Diagnosis not present

## 2017-04-06 DIAGNOSIS — C9000 Multiple myeloma not having achieved remission: Secondary | ICD-10-CM | POA: Diagnosis not present

## 2017-04-18 DIAGNOSIS — D473 Essential (hemorrhagic) thrombocythemia: Secondary | ICD-10-CM | POA: Diagnosis not present

## 2017-04-18 DIAGNOSIS — C9001 Multiple myeloma in remission: Secondary | ICD-10-CM | POA: Diagnosis not present

## 2017-04-18 DIAGNOSIS — Z87891 Personal history of nicotine dependence: Secondary | ICD-10-CM | POA: Diagnosis not present

## 2017-04-18 DIAGNOSIS — G40909 Epilepsy, unspecified, not intractable, without status epilepticus: Secondary | ICD-10-CM | POA: Diagnosis not present

## 2017-04-18 DIAGNOSIS — Z79899 Other long term (current) drug therapy: Secondary | ICD-10-CM | POA: Diagnosis not present

## 2017-04-18 DIAGNOSIS — D61818 Other pancytopenia: Secondary | ICD-10-CM | POA: Diagnosis not present

## 2017-04-18 DIAGNOSIS — Z7982 Long term (current) use of aspirin: Secondary | ICD-10-CM | POA: Diagnosis not present

## 2017-04-18 DIAGNOSIS — C9000 Multiple myeloma not having achieved remission: Secondary | ICD-10-CM | POA: Diagnosis not present

## 2017-04-22 DIAGNOSIS — C9001 Multiple myeloma in remission: Secondary | ICD-10-CM | POA: Diagnosis not present

## 2017-04-22 DIAGNOSIS — D7589 Other specified diseases of blood and blood-forming organs: Secondary | ICD-10-CM | POA: Diagnosis not present

## 2017-04-22 DIAGNOSIS — D7289 Other specified disorders of white blood cells: Secondary | ICD-10-CM | POA: Diagnosis not present

## 2017-04-22 DIAGNOSIS — Z52011 Autologous donor, stem cells: Secondary | ICD-10-CM | POA: Diagnosis not present

## 2017-04-22 DIAGNOSIS — Z5189 Encounter for other specified aftercare: Secondary | ICD-10-CM | POA: Diagnosis not present

## 2017-04-22 DIAGNOSIS — D499 Neoplasm of unspecified behavior of unspecified site: Secondary | ICD-10-CM | POA: Diagnosis not present

## 2017-04-23 DIAGNOSIS — D473 Essential (hemorrhagic) thrombocythemia: Secondary | ICD-10-CM | POA: Diagnosis not present

## 2017-04-23 DIAGNOSIS — C9000 Multiple myeloma not having achieved remission: Secondary | ICD-10-CM | POA: Diagnosis not present

## 2017-04-23 DIAGNOSIS — Z52011 Autologous donor, stem cells: Secondary | ICD-10-CM | POA: Diagnosis not present

## 2017-04-24 DIAGNOSIS — T420X5D Adverse effect of hydantoin derivatives, subsequent encounter: Secondary | ICD-10-CM | POA: Diagnosis not present

## 2017-04-24 DIAGNOSIS — Z5189 Encounter for other specified aftercare: Secondary | ICD-10-CM | POA: Diagnosis not present

## 2017-04-24 DIAGNOSIS — D693 Immune thrombocytopenic purpura: Secondary | ICD-10-CM | POA: Diagnosis not present

## 2017-04-24 DIAGNOSIS — D61810 Antineoplastic chemotherapy induced pancytopenia: Secondary | ICD-10-CM | POA: Diagnosis not present

## 2017-04-24 DIAGNOSIS — Z52011 Autologous donor, stem cells: Secondary | ICD-10-CM | POA: Diagnosis not present

## 2017-04-24 DIAGNOSIS — T451X5D Adverse effect of antineoplastic and immunosuppressive drugs, subsequent encounter: Secondary | ICD-10-CM | POA: Diagnosis not present

## 2017-04-24 DIAGNOSIS — D61811 Other drug-induced pancytopenia: Secondary | ICD-10-CM | POA: Diagnosis not present

## 2017-04-24 DIAGNOSIS — C9001 Multiple myeloma in remission: Secondary | ICD-10-CM | POA: Diagnosis not present

## 2017-04-25 DIAGNOSIS — Z52011 Autologous donor, stem cells: Secondary | ICD-10-CM | POA: Diagnosis not present

## 2017-04-25 DIAGNOSIS — C9000 Multiple myeloma not having achieved remission: Secondary | ICD-10-CM | POA: Diagnosis not present

## 2017-04-25 DIAGNOSIS — C9001 Multiple myeloma in remission: Secondary | ICD-10-CM | POA: Diagnosis not present

## 2017-04-26 DIAGNOSIS — Z52011 Autologous donor, stem cells: Secondary | ICD-10-CM | POA: Diagnosis not present

## 2017-04-26 DIAGNOSIS — C9001 Multiple myeloma in remission: Secondary | ICD-10-CM | POA: Diagnosis not present

## 2017-04-27 DIAGNOSIS — Z52011 Autologous donor, stem cells: Secondary | ICD-10-CM | POA: Diagnosis not present

## 2017-04-27 DIAGNOSIS — Z5189 Encounter for other specified aftercare: Secondary | ICD-10-CM | POA: Diagnosis not present

## 2017-04-27 DIAGNOSIS — T420X5D Adverse effect of hydantoin derivatives, subsequent encounter: Secondary | ICD-10-CM | POA: Diagnosis not present

## 2017-04-27 DIAGNOSIS — T451X5D Adverse effect of antineoplastic and immunosuppressive drugs, subsequent encounter: Secondary | ICD-10-CM | POA: Diagnosis not present

## 2017-04-27 DIAGNOSIS — C9001 Multiple myeloma in remission: Secondary | ICD-10-CM | POA: Diagnosis not present

## 2017-04-27 DIAGNOSIS — D61811 Other drug-induced pancytopenia: Secondary | ICD-10-CM | POA: Diagnosis not present

## 2017-04-28 DIAGNOSIS — C9001 Multiple myeloma in remission: Secondary | ICD-10-CM | POA: Diagnosis not present

## 2017-04-28 DIAGNOSIS — Z79899 Other long term (current) drug therapy: Secondary | ICD-10-CM | POA: Diagnosis not present

## 2017-05-02 DIAGNOSIS — Z79899 Other long term (current) drug therapy: Secondary | ICD-10-CM | POA: Diagnosis not present

## 2017-05-02 DIAGNOSIS — Z9114 Patient's other noncompliance with medication regimen: Secondary | ICD-10-CM | POA: Diagnosis not present

## 2017-05-02 DIAGNOSIS — Z87891 Personal history of nicotine dependence: Secondary | ICD-10-CM | POA: Diagnosis not present

## 2017-05-02 DIAGNOSIS — C9001 Multiple myeloma in remission: Secondary | ICD-10-CM | POA: Diagnosis not present

## 2017-05-02 DIAGNOSIS — F419 Anxiety disorder, unspecified: Secondary | ICD-10-CM | POA: Diagnosis present

## 2017-05-02 DIAGNOSIS — G40909 Epilepsy, unspecified, not intractable, without status epilepticus: Secondary | ICD-10-CM | POA: Diagnosis not present

## 2017-05-02 DIAGNOSIS — C9000 Multiple myeloma not having achieved remission: Secondary | ICD-10-CM | POA: Diagnosis not present

## 2017-05-02 DIAGNOSIS — G40919 Epilepsy, unspecified, intractable, without status epilepticus: Secondary | ICD-10-CM | POA: Diagnosis not present

## 2017-05-02 DIAGNOSIS — R569 Unspecified convulsions: Secondary | ICD-10-CM | POA: Diagnosis not present

## 2017-05-02 DIAGNOSIS — G40B19 Juvenile myoclonic epilepsy, intractable, without status epilepticus: Secondary | ICD-10-CM | POA: Diagnosis not present

## 2017-05-02 DIAGNOSIS — G40409 Other generalized epilepsy and epileptic syndromes, not intractable, without status epilepticus: Secondary | ICD-10-CM | POA: Diagnosis not present

## 2017-05-02 DIAGNOSIS — Z9484 Stem cells transplant status: Secondary | ICD-10-CM | POA: Diagnosis not present

## 2017-05-02 DIAGNOSIS — F411 Generalized anxiety disorder: Secondary | ICD-10-CM | POA: Diagnosis not present

## 2017-05-03 DIAGNOSIS — Z9484 Stem cells transplant status: Secondary | ICD-10-CM

## 2017-05-03 HISTORY — DX: Stem cells transplant status: Z94.84

## 2017-05-05 DIAGNOSIS — R569 Unspecified convulsions: Secondary | ICD-10-CM | POA: Diagnosis not present

## 2017-05-05 DIAGNOSIS — C9000 Multiple myeloma not having achieved remission: Secondary | ICD-10-CM | POA: Diagnosis not present

## 2017-05-05 DIAGNOSIS — Z9484 Stem cells transplant status: Secondary | ICD-10-CM | POA: Diagnosis not present

## 2017-05-05 DIAGNOSIS — D61810 Antineoplastic chemotherapy induced pancytopenia: Secondary | ICD-10-CM | POA: Diagnosis not present

## 2017-05-05 DIAGNOSIS — I10 Essential (primary) hypertension: Secondary | ICD-10-CM | POA: Diagnosis not present

## 2017-05-06 DIAGNOSIS — D61810 Antineoplastic chemotherapy induced pancytopenia: Secondary | ICD-10-CM | POA: Diagnosis not present

## 2017-05-06 DIAGNOSIS — C9000 Multiple myeloma not having achieved remission: Secondary | ICD-10-CM | POA: Diagnosis not present

## 2017-05-07 DIAGNOSIS — C9000 Multiple myeloma not having achieved remission: Secondary | ICD-10-CM | POA: Diagnosis not present

## 2017-05-09 DIAGNOSIS — Z4901 Encounter for fitting and adjustment of extracorporeal dialysis catheter: Secondary | ICD-10-CM | POA: Diagnosis not present

## 2017-05-10 DIAGNOSIS — Z9484 Stem cells transplant status: Secondary | ICD-10-CM | POA: Diagnosis not present

## 2017-05-10 DIAGNOSIS — R509 Fever, unspecified: Secondary | ICD-10-CM | POA: Diagnosis not present

## 2017-05-10 DIAGNOSIS — Z808 Family history of malignant neoplasm of other organs or systems: Secondary | ICD-10-CM | POA: Diagnosis not present

## 2017-05-10 DIAGNOSIS — K1230 Oral mucositis (ulcerative), unspecified: Secondary | ICD-10-CM | POA: Diagnosis present

## 2017-05-10 DIAGNOSIS — G40B19 Juvenile myoclonic epilepsy, intractable, without status epilepticus: Secondary | ICD-10-CM | POA: Diagnosis not present

## 2017-05-10 DIAGNOSIS — D709 Neutropenia, unspecified: Secondary | ICD-10-CM | POA: Diagnosis present

## 2017-05-10 DIAGNOSIS — G40B09 Juvenile myoclonic epilepsy, not intractable, without status epilepticus: Secondary | ICD-10-CM | POA: Diagnosis present

## 2017-05-10 DIAGNOSIS — F419 Anxiety disorder, unspecified: Secondary | ICD-10-CM | POA: Diagnosis present

## 2017-05-10 DIAGNOSIS — C9000 Multiple myeloma not having achieved remission: Secondary | ICD-10-CM | POA: Diagnosis present

## 2017-05-10 DIAGNOSIS — B954 Other streptococcus as the cause of diseases classified elsewhere: Secondary | ICD-10-CM | POA: Diagnosis present

## 2017-05-10 DIAGNOSIS — R5081 Fever presenting with conditions classified elsewhere: Secondary | ICD-10-CM | POA: Diagnosis present

## 2017-05-10 DIAGNOSIS — D61810 Antineoplastic chemotherapy induced pancytopenia: Secondary | ICD-10-CM | POA: Diagnosis present

## 2017-05-10 DIAGNOSIS — R197 Diarrhea, unspecified: Secondary | ICD-10-CM | POA: Diagnosis present

## 2017-05-10 DIAGNOSIS — F339 Major depressive disorder, recurrent, unspecified: Secondary | ICD-10-CM | POA: Diagnosis not present

## 2017-05-10 DIAGNOSIS — Z8669 Personal history of other diseases of the nervous system and sense organs: Secondary | ICD-10-CM | POA: Diagnosis not present

## 2017-05-10 DIAGNOSIS — F329 Major depressive disorder, single episode, unspecified: Secondary | ICD-10-CM | POA: Diagnosis present

## 2017-05-10 DIAGNOSIS — C9001 Multiple myeloma in remission: Secondary | ICD-10-CM | POA: Diagnosis not present

## 2017-05-13 DIAGNOSIS — R5383 Other fatigue: Secondary | ICD-10-CM | POA: Diagnosis not present

## 2017-05-13 DIAGNOSIS — C9000 Multiple myeloma not having achieved remission: Secondary | ICD-10-CM | POA: Diagnosis not present

## 2017-05-14 DIAGNOSIS — C9000 Multiple myeloma not having achieved remission: Secondary | ICD-10-CM | POA: Diagnosis not present

## 2017-05-15 DIAGNOSIS — D61818 Other pancytopenia: Secondary | ICD-10-CM | POA: Diagnosis not present

## 2017-05-15 DIAGNOSIS — C9000 Multiple myeloma not having achieved remission: Secondary | ICD-10-CM | POA: Diagnosis not present

## 2017-05-15 DIAGNOSIS — T50905A Adverse effect of unspecified drugs, medicaments and biological substances, initial encounter: Secondary | ICD-10-CM | POA: Diagnosis not present

## 2017-05-15 DIAGNOSIS — Z9484 Stem cells transplant status: Secondary | ICD-10-CM | POA: Diagnosis not present

## 2017-05-15 DIAGNOSIS — D6959 Other secondary thrombocytopenia: Secondary | ICD-10-CM | POA: Diagnosis not present

## 2017-05-16 DIAGNOSIS — Z452 Encounter for adjustment and management of vascular access device: Secondary | ICD-10-CM | POA: Diagnosis not present

## 2017-05-16 DIAGNOSIS — D473 Essential (hemorrhagic) thrombocythemia: Secondary | ICD-10-CM | POA: Diagnosis not present

## 2017-05-16 DIAGNOSIS — F419 Anxiety disorder, unspecified: Secondary | ICD-10-CM | POA: Diagnosis not present

## 2017-05-16 DIAGNOSIS — C9000 Multiple myeloma not having achieved remission: Secondary | ICD-10-CM | POA: Diagnosis not present

## 2017-05-16 DIAGNOSIS — Z9484 Stem cells transplant status: Secondary | ICD-10-CM | POA: Diagnosis not present

## 2017-05-16 DIAGNOSIS — G40909 Epilepsy, unspecified, not intractable, without status epilepticus: Secondary | ICD-10-CM | POA: Diagnosis not present

## 2017-05-16 DIAGNOSIS — D61810 Antineoplastic chemotherapy induced pancytopenia: Secondary | ICD-10-CM | POA: Diagnosis not present

## 2017-05-16 DIAGNOSIS — Z79899 Other long term (current) drug therapy: Secondary | ICD-10-CM | POA: Diagnosis not present

## 2017-05-19 DIAGNOSIS — C9000 Multiple myeloma not having achieved remission: Secondary | ICD-10-CM | POA: Diagnosis not present

## 2017-05-19 DIAGNOSIS — Z9484 Stem cells transplant status: Secondary | ICD-10-CM | POA: Diagnosis not present

## 2017-05-25 DIAGNOSIS — Z87898 Personal history of other specified conditions: Secondary | ICD-10-CM | POA: Diagnosis not present

## 2017-05-25 DIAGNOSIS — Z87891 Personal history of nicotine dependence: Secondary | ICD-10-CM | POA: Diagnosis not present

## 2017-05-25 DIAGNOSIS — G40909 Epilepsy, unspecified, not intractable, without status epilepticus: Secondary | ICD-10-CM | POA: Diagnosis not present

## 2017-05-25 DIAGNOSIS — C9001 Multiple myeloma in remission: Secondary | ICD-10-CM | POA: Diagnosis not present

## 2017-05-25 DIAGNOSIS — Z79899 Other long term (current) drug therapy: Secondary | ICD-10-CM | POA: Diagnosis not present

## 2017-05-25 DIAGNOSIS — D473 Essential (hemorrhagic) thrombocythemia: Secondary | ICD-10-CM | POA: Diagnosis not present

## 2017-05-25 DIAGNOSIS — Z9484 Stem cells transplant status: Secondary | ICD-10-CM | POA: Diagnosis not present

## 2017-06-01 DIAGNOSIS — C9000 Multiple myeloma not having achieved remission: Secondary | ICD-10-CM | POA: Diagnosis not present

## 2017-06-01 DIAGNOSIS — Z9484 Stem cells transplant status: Secondary | ICD-10-CM | POA: Diagnosis not present

## 2017-06-08 DIAGNOSIS — Z9484 Stem cells transplant status: Secondary | ICD-10-CM | POA: Diagnosis not present

## 2017-06-08 DIAGNOSIS — C9002 Multiple myeloma in relapse: Secondary | ICD-10-CM | POA: Diagnosis not present

## 2017-06-08 DIAGNOSIS — I10 Essential (primary) hypertension: Secondary | ICD-10-CM | POA: Diagnosis not present

## 2017-06-08 DIAGNOSIS — C9000 Multiple myeloma not having achieved remission: Secondary | ICD-10-CM | POA: Diagnosis not present

## 2017-06-08 DIAGNOSIS — D473 Essential (hemorrhagic) thrombocythemia: Secondary | ICD-10-CM | POA: Diagnosis not present

## 2017-07-13 DIAGNOSIS — Z9221 Personal history of antineoplastic chemotherapy: Secondary | ICD-10-CM | POA: Diagnosis not present

## 2017-07-13 DIAGNOSIS — Z87891 Personal history of nicotine dependence: Secondary | ICD-10-CM | POA: Diagnosis not present

## 2017-07-13 DIAGNOSIS — Z79899 Other long term (current) drug therapy: Secondary | ICD-10-CM | POA: Diagnosis not present

## 2017-07-13 DIAGNOSIS — C9000 Multiple myeloma not having achieved remission: Secondary | ICD-10-CM | POA: Diagnosis not present

## 2017-07-13 DIAGNOSIS — C9002 Multiple myeloma in relapse: Secondary | ICD-10-CM | POA: Diagnosis not present

## 2017-07-13 DIAGNOSIS — D473 Essential (hemorrhagic) thrombocythemia: Secondary | ICD-10-CM | POA: Diagnosis not present

## 2017-07-13 DIAGNOSIS — G40909 Epilepsy, unspecified, not intractable, without status epilepticus: Secondary | ICD-10-CM | POA: Diagnosis not present

## 2017-07-13 DIAGNOSIS — I10 Essential (primary) hypertension: Secondary | ICD-10-CM | POA: Diagnosis not present

## 2017-07-13 DIAGNOSIS — Z9484 Stem cells transplant status: Secondary | ICD-10-CM | POA: Diagnosis not present

## 2017-07-14 ENCOUNTER — Telehealth: Payer: Self-pay | Admitting: Neurology

## 2017-07-14 NOTE — Telephone Encounter (Signed)
Pt also has a question about her medication and a seizure she had and being able to drive, please call and advise

## 2017-07-14 NOTE — Telephone Encounter (Signed)
Pt wants to know if the notes from [facility] and has received by Dr Delice Lesch and reviewed

## 2017-07-14 NOTE — Telephone Encounter (Signed)
Spoke with pt, she had experienced a seizure in July and was wondering what this means with her license.  She knows the law is that she is not to drive for 6 months following a seizure, however this seizure was brought on mainly in part to chemo and medication changes (not her Keppra, but her oncology medications prior to stem cell transplant)  I advised her not to drive, just to be on the safe side.  She agreed.

## 2017-07-18 ENCOUNTER — Ambulatory Visit: Payer: Medicare Other | Admitting: Neurology

## 2017-08-08 ENCOUNTER — Ambulatory Visit (INDEPENDENT_AMBULATORY_CARE_PROVIDER_SITE_OTHER): Payer: Medicare Other | Admitting: Neurology

## 2017-08-08 ENCOUNTER — Encounter: Payer: Self-pay | Admitting: Neurology

## 2017-08-08 VITALS — BP 136/72 | HR 75 | Ht 63.0 in | Wt 127.0 lb

## 2017-08-08 DIAGNOSIS — G40B09 Juvenile myoclonic epilepsy, not intractable, without status epilepticus: Secondary | ICD-10-CM

## 2017-08-08 MED ORDER — LEVETIRACETAM 500 MG PO TABS: ORAL_TABLET | ORAL | 11 refills | Status: DC

## 2017-08-08 NOTE — Progress Notes (Signed)
NEUROLOGY FOLLOW UP OFFICE NOTE  Catherine Bullock [id number] April 27, 1946  HISTORY OF PRESENT ILLNESS: I had the pleasure of seeing Catherine Bullock in follow-up in the neurology clinic on 08/08/2017.  The patient was last seen 7 months ago for seizures. She has a diagnosis of juvenile myoclonic epilepsy since age 71, seizures usually start with myoclonic jerks that progress to a convulsion, she has not had any such seizures since 1987, however recently had 2 provoked seizures. The first event occurred on 02/13/17. She reports recalling the entire episode, and symptoms mostly consisted of inability to make any decisions. Her husband noted she left her pocketbook when she went in to [place], and it took her a long time to decided what she wanted. They went home and she could not decide what to wear to a ballgame. She came out in her underwear saying she was ready to go, her husband reminded her she was still in her underwear. He called EMS and she reports symptoms got better before they arrived. The episode was attributed to suddenly stopping her Dilantin about 5 weeks prior. She was on Keppra 54m BID and dose was increased in the ER to 10016mBID. The second event occurred on 05/02/17. She had her first dose of high dose Melphalan a few hours prior, in preparation for stem cell therapy. They went back to the hotel and after taking her first bite of food, her husband reported a convulsion lasting a few minutes with post-ictal confusion. This was brief, by the time EMS arrived, she was back to baseline. [facility] she received her autologous stem cell transplant as an inpatient, with no further seizures. Keppra dose was increased to 150024mID. She and her husband deny any further similar symptoms since then. She is tolerating higher dose Keppra without side effects. She denies any headaches, dizziness, diplopia, focal numbness/tingling/weakness, no falls.   She had an MRI brain with  and without contrast done 02/24/17 which did not show any acute changes, hippocampi symmetric with no abnormal signal or enhancement seen.  HPI 01/12/2017: This is a pleasant 71 48 RH with a history of multiple myeloma, hypertension, juvenile myoclonic epilepsy. She had her first seizure in the 9th grade, at that time attributed to sleep deprivation and stress. She had her second seizure in 1972, and had several more until 1987. She started Dilantin in 1976 and would have breakthrough seizures due to medication compliance. She reports seizures always started with myoclonic jerks that would proceed to a convulsion. She denies any myoclonic jerks or convulsions since 1987, she has been very compliant with Dilantin 200m58m AM, 100mg64mPM with no side effects. She and her husband deny any staring episodes. She recalls one time in 1987 when she missed doses of Dilantin, she was having some cognitive changes, then came to driving to her parents' house with no recollection of events. This has not recurred since then. She denies any olfactory/gustatory hallucinations, deja vu, rising epigastric sensation, focal numbness/tingling/weakness. She has been undergoing treatment for multiple myeloma, and was noted to have pancytopenia due to interaction of lenalidomide with her Dilantin. Per records, levels have started to recover. She was switched to [place] week, instructed to stop Dilantin and start Keppra. She has been off Dilantin for a week and taking Keppra 500mg 4mwith no side effects. She denies any myoclonic jerks or convulsions. She has minimized driving. She denies any headaches, dizziness, diplopia, dysarthria/dysphagia, neck/back pain,  bowel/bladder dysfunction. No falls.  Epilepsy Risk Factors:  Her son started had 2 seizures as a teenager and is also taking Keppra. Otherwise she had a normal birth and early development.  There is no history of febrile convulsions, CNS infections such as  meningitis/encephalitis, significant traumatic brain injury, neurosurgical procedures.  PAST MEDICAL HISTORY: Past Medical History:  Diagnosis Date  . Anxiety   . Colon adenoma   . Degenerative disk disease   . Epilepsy ([place])    last seizure 1987  . GERD (gastroesophageal reflux disease)   . Hypertension   . IgG monoclonal gammopathy   . Iron deficiency anemia   . Multiple myeloma ([place]) 11/08/2016  . MVP (mitral valve prolapse)   . Osteoporosis   . Thrombocytosis ([place])     MEDICATIONS: Current Outpatient Prescriptions on File Prior to Visit  Medication Sig Dispense Refill  . acyclovir (ZOVIRAX) 200 MG capsule Take 1 capsule (200 mg total) by mouth 2 (two) times daily. 60 capsule 2  . allopurinol (ZYLOPRIM) 300 MG tablet Take 300 mg by mouth daily.  0  . ALPRAZolam (XANAX) 0.25 MG tablet Take 1 tablet (0.25 mg total) by mouth every 8 (eight) hours as needed. 30 tablet 1  . aspirin EC 81 MG tablet Take 81 mg by mouth daily.    . Calcium-Vitamin D-Vitamin K 500-100-40 MG-UNT-MCG CHEW Chew 2 capsules by mouth daily.    Marland Kitchen dexamethasone (DECADRON) 4 MG tablet 10 tab po weekly with chemo 40 tablet 4  . docusate sodium (COLACE) 100 MG capsule Take 100 mg by mouth 2 (two) times daily.    Marland Kitchen escitalopram (LEXAPRO) 20 MG tablet Take 20 mg by mouth daily.    . ferrous sulfate 325 (65 FE) MG tablet Take 325 mg by mouth daily with breakfast.    . fish oil-omega-3 fatty acids 1000 MG capsule Take 1 g by mouth daily.    Marland Kitchen HYDROcodone-acetaminophen (NORCO) 10-325 MG tablet     . levETIRAcetam (KEPPRA) 1000 MG tablet Take 1 tablet (1,000 mg total) by mouth 2 (two) times daily. 180 tablet 1  . Multiple Vitamin (MULTIVITAMIN) tablet Take 1 tablet by mouth daily.    . Omeprazole Magnesium (PRILOSEC OTC PO) Take by mouth daily.    . Probiotic Product (PROBIOTIC DAILY PO) Take by mouth daily.    Marland Kitchen REVLIMID 25 MG capsule Take 25 mg by mouth daily.  0  . tretinoin (RETIN-A) 0.1 % cream Apply 1 application  topically daily.  4  . valsartan (DIOVAN) 80 MG tablet Take 80 mg by mouth daily.  1   No current facility-administered medications on file prior to visit.     ALLERGIES: No Known Allergies  FAMILY HISTORY: Family History  Problem Relation Age of Onset  . Nephrolithiasis Mother   . Alzheimer's disease Mother   . Ulcers Mother   . Prostate cancer Father   . Multiple myeloma Father   . Colon cancer Neg Hx     SOCIAL HISTORY: Social History   Social History  . Marital status: Married    Spouse name: N/A  . Number of children: N/A  . Years of education: N/A   Occupational History  . Not on file.   Social History Main Topics  . Smoking status: Former Smoker    Quit date: 06/21/1979  . Smokeless tobacco: Never Used  . Alcohol use 1.2 oz/week    2 Glasses of wine per week  . Drug use: No  . Sexual activity: Yes  Other Topics Concern  . Not on file   Social History Narrative  . No narrative on file    REVIEW OF SYSTEMS: Constitutional: No fevers, chills, or sweats, no generalized fatigue, change in appetite Eyes: No visual changes, double vision, eye pain Ear, nose and throat: No hearing loss, ear pain, nasal congestion, sore throat Cardiovascular: No chest pain, palpitations Respiratory:  No shortness of breath at rest or with exertion, wheezes GastrointestinaI: No nausea, vomiting, diarrhea, abdominal pain, fecal incontinence Genitourinary:  No dysuria, urinary retention or frequency Musculoskeletal:  No neck pain, back pain Integumentary: No rash, pruritus, skin lesions Neurological: as above Psychiatric: No depression, insomnia, anxiety Endocrine: No palpitations, fatigue, diaphoresis, mood swings, change in appetite, change in weight, increased thirst Hematologic/Lymphatic:  No anemia, purpura, petechiae. Allergic/Immunologic: no itchy/runny eyes, nasal congestion, recent allergic reactions, rashes  PHYSICAL EXAM: Vitals:   08/08/17 1358  BP: 136/72    Pulse: 75  SpO2: 95%   General: No acute distress Head:  Normocephalic/atraumatic Neck: supple, no paraspinal tenderness, full range of motion Heart:  Regular rate and rhythm Lungs:  Clear to auscultation bilaterally Back: No paraspinal tenderness Skin/Extremities: No rash, no edema Neurological Exam: alert and oriented to person, place, and time. No aphasia or dysarthria. Fund of knowledge is appropriate.  Recent and remote memory are intact. 3/3 delayed recall.  Attention and concentration are normal.    Able to name objects and repeat phrases. Cranial nerves: Pupils equal, round, reactive to light. Extraocular movements intact with no nystagmus. Visual fields full. Facial sensation intact. No facial asymmetry. Tongue, uvula, palate midline.  Motor: Bulk and tone normal, muscle strength 5/5 throughout with no pronator drift.  Sensation to light touch intact.  No extinction to double simultaneous stimulation.  Deep tendon reflexes 2+ throughout, toes downgoing.  Finger to nose testing intact.  Gait narrow-based and steady, able to tandem walk adequately.  Romberg negative.  IMPRESSION: This is a pleasant 71 yo RH woman with a history of juvenile myoclonic epilepsy, who had been seizure-free since 1987 on chronic Dilantin therapy. She has multiple myeloma, and had pancytopenia from interaction of Dilantin with lenalidomide. Dilantin was discontinued by her oncologist and she was started on Keppra. She denies any of her typical seizures, but had an episode where she was indecisive (which is unusual) last April, a few weeks after stopping Dilantin. Keppra dose increased. On 05/02/17, a few hours after first dose of Melphalan, she had a witnessed convulsion. Seizures can rarely be seen after high dose Melphalan. The seizure was not typical of her usual seizures, no preceding myoclonus. This appears to have been provoked by medication. Keppra dose increased to 1563m BID (takes 5051m3 tabs BID due to  swallowing difficulties post-chemo). No further seizures or seizure-like symptoms since then. Seizures had been very well controlled since 1987, the 2 episodes appear to have been provoked. We discussed [place] driving laws, she is now 3 months from the last episode, we discussed resuming driving short distances. She knows to stop driving for any change in symptoms. We discussed avoidance of seizure triggers, including alcohol, missing medications, and sleep deprivation. She will follow-up in 3 months and knows to call for any changes.   Thank you for allowing me to participate in her care.  Please do not hesitate to call for any questions or concerns.  The duration of this appointment visit was 25 minutes of face-to-face time with the patient.  Greater than 50% of this time was spent in  counseling, explanation of diagnosis, planning of further management, and coordination of care.   Ellouise Newer, M.D.   CC: Dr. Tamala Julian, Dr. Norma Fredrickson

## 2017-08-08 NOTE — Patient Instructions (Addendum)
1. Continue Keppra 500mg : Take 3 tablets twice a day 2. Continue to monitor symptoms, if any changes, would stop driving. For now, may drive short distances. 3. Follow-up in 3 months, call for any changes  Seizure Precautions: 1. If medication has been prescribed for you to prevent seizures, take it exactly as directed.  Do not stop taking the medicine without talking to your doctor first, even if you have not had a seizure in a long time.   2. Avoid activities in which a seizure would cause danger to yourself or to others.  Don't operate dangerous machinery, swim alone, or climb in high or dangerous places, such as on ladders, roofs, or girders.  Do not drive unless your doctor says you may.  3. If you have any warning that you may have a seizure, lay down in a safe place where you can't hurt yourself.    4.  No driving for 6 months from last seizure, as per [facility]   Please refer to the following link on the [place] website for more information: http://[email or web address].org/[email or web address]/drivingu.cfm   5.  Maintain good sleep hygiene. Avoid alcohol.  6.  Contact your doctor if you have any problems that may be related to the medicine you are taking.  7.  Call 911 and bring the patient back to the ED if:        A.  The seizure lasts longer than 5 minutes.       B.  The patient doesn't awaken shortly after the seizure  C.  The patient has new problems such as difficulty seeing, speaking or moving  D.  The patient was injured during the seizure  E.  The patient has a temperature over 102 F (39C)  F.  The patient vomited and now is having trouble breathing

## 2017-08-17 DIAGNOSIS — Z9484 Stem cells transplant status: Secondary | ICD-10-CM | POA: Diagnosis not present

## 2017-08-17 DIAGNOSIS — C9002 Multiple myeloma in relapse: Secondary | ICD-10-CM | POA: Diagnosis not present

## 2017-08-17 DIAGNOSIS — Z1379 Encounter for other screening for genetic and chromosomal anomalies: Secondary | ICD-10-CM | POA: Diagnosis not present

## 2017-08-17 DIAGNOSIS — C9000 Multiple myeloma not having achieved remission: Secondary | ICD-10-CM | POA: Diagnosis not present

## 2017-08-18 DIAGNOSIS — Z9484 Stem cells transplant status: Secondary | ICD-10-CM | POA: Diagnosis not present

## 2017-08-18 DIAGNOSIS — C9000 Multiple myeloma not having achieved remission: Secondary | ICD-10-CM | POA: Diagnosis not present

## 2017-08-18 DIAGNOSIS — Z1379 Encounter for other screening for genetic and chromosomal anomalies: Secondary | ICD-10-CM | POA: Diagnosis not present

## 2017-09-05 DIAGNOSIS — D473 Essential (hemorrhagic) thrombocythemia: Secondary | ICD-10-CM | POA: Diagnosis not present

## 2017-09-05 DIAGNOSIS — I10 Essential (primary) hypertension: Secondary | ICD-10-CM | POA: Diagnosis not present

## 2017-09-05 DIAGNOSIS — G40909 Epilepsy, unspecified, not intractable, without status epilepticus: Secondary | ICD-10-CM | POA: Diagnosis not present

## 2017-09-05 DIAGNOSIS — Z5111 Encounter for antineoplastic chemotherapy: Secondary | ICD-10-CM | POA: Diagnosis not present

## 2017-09-05 DIAGNOSIS — Z5181 Encounter for therapeutic drug level monitoring: Secondary | ICD-10-CM | POA: Diagnosis not present

## 2017-09-05 DIAGNOSIS — Z79899 Other long term (current) drug therapy: Secondary | ICD-10-CM | POA: Diagnosis not present

## 2017-09-05 DIAGNOSIS — Z87891 Personal history of nicotine dependence: Secondary | ICD-10-CM | POA: Diagnosis not present

## 2017-09-05 DIAGNOSIS — C9001 Multiple myeloma in remission: Secondary | ICD-10-CM | POA: Diagnosis not present

## 2017-09-05 DIAGNOSIS — C9000 Multiple myeloma not having achieved remission: Secondary | ICD-10-CM | POA: Diagnosis not present

## 2017-09-14 DIAGNOSIS — C9000 Multiple myeloma not having achieved remission: Secondary | ICD-10-CM | POA: Diagnosis not present

## 2017-09-14 DIAGNOSIS — C9002 Multiple myeloma in relapse: Secondary | ICD-10-CM | POA: Diagnosis not present

## 2017-09-14 DIAGNOSIS — C9001 Multiple myeloma in remission: Secondary | ICD-10-CM | POA: Diagnosis not present

## 2017-09-14 DIAGNOSIS — Z9484 Stem cells transplant status: Secondary | ICD-10-CM | POA: Diagnosis not present

## 2017-09-21 DIAGNOSIS — C9000 Multiple myeloma not having achieved remission: Secondary | ICD-10-CM | POA: Diagnosis not present

## 2017-09-21 DIAGNOSIS — C9001 Multiple myeloma in remission: Secondary | ICD-10-CM | POA: Diagnosis not present

## 2017-09-28 DIAGNOSIS — C9002 Multiple myeloma in relapse: Secondary | ICD-10-CM | POA: Diagnosis not present

## 2017-09-28 DIAGNOSIS — Z9484 Stem cells transplant status: Secondary | ICD-10-CM | POA: Diagnosis not present

## 2017-10-28 ENCOUNTER — Other Ambulatory Visit: Payer: Self-pay | Admitting: Obstetrics and Gynecology

## 2017-10-28 DIAGNOSIS — Z1231 Encounter for screening mammogram for malignant neoplasm of breast: Secondary | ICD-10-CM

## 2017-11-09 DIAGNOSIS — C9001 Multiple myeloma in remission: Secondary | ICD-10-CM | POA: Diagnosis not present

## 2017-11-09 DIAGNOSIS — C9002 Multiple myeloma in relapse: Secondary | ICD-10-CM | POA: Diagnosis not present

## 2017-11-09 DIAGNOSIS — R0989 Other specified symptoms and signs involving the circulatory and respiratory systems: Secondary | ICD-10-CM | POA: Diagnosis not present

## 2017-11-09 DIAGNOSIS — J180 Bronchopneumonia, unspecified organism: Secondary | ICD-10-CM | POA: Diagnosis not present

## 2017-11-09 DIAGNOSIS — Z9484 Stem cells transplant status: Secondary | ICD-10-CM | POA: Diagnosis not present

## 2017-11-09 DIAGNOSIS — R509 Fever, unspecified: Secondary | ICD-10-CM | POA: Diagnosis not present

## 2017-11-09 DIAGNOSIS — R05 Cough: Secondary | ICD-10-CM | POA: Diagnosis not present

## 2017-11-09 DIAGNOSIS — C9000 Multiple myeloma not having achieved remission: Secondary | ICD-10-CM | POA: Diagnosis not present

## 2017-11-14 DIAGNOSIS — Z87891 Personal history of nicotine dependence: Secondary | ICD-10-CM | POA: Diagnosis not present

## 2017-11-14 DIAGNOSIS — R569 Unspecified convulsions: Secondary | ICD-10-CM | POA: Diagnosis not present

## 2017-11-14 DIAGNOSIS — C9001 Multiple myeloma in remission: Secondary | ICD-10-CM | POA: Diagnosis not present

## 2017-11-14 DIAGNOSIS — Z52011 Autologous donor, stem cells: Secondary | ICD-10-CM | POA: Diagnosis not present

## 2017-11-14 DIAGNOSIS — I10 Essential (primary) hypertension: Secondary | ICD-10-CM | POA: Diagnosis not present

## 2017-11-14 DIAGNOSIS — C9002 Multiple myeloma in relapse: Secondary | ICD-10-CM | POA: Diagnosis not present

## 2017-11-14 DIAGNOSIS — D473 Essential (hemorrhagic) thrombocythemia: Secondary | ICD-10-CM | POA: Diagnosis not present

## 2017-11-14 DIAGNOSIS — G629 Polyneuropathy, unspecified: Secondary | ICD-10-CM | POA: Diagnosis not present

## 2017-11-14 DIAGNOSIS — Z9484 Stem cells transplant status: Secondary | ICD-10-CM | POA: Diagnosis not present

## 2017-11-15 ENCOUNTER — Ambulatory Visit: Payer: Medicare Other | Admitting: Neurology

## 2017-11-17 DIAGNOSIS — Z23 Encounter for immunization: Secondary | ICD-10-CM | POA: Diagnosis not present

## 2017-11-17 DIAGNOSIS — C9001 Multiple myeloma in remission: Secondary | ICD-10-CM | POA: Diagnosis not present

## 2017-12-02 ENCOUNTER — Ambulatory Visit: Payer: Medicare Other | Admitting: Neurology

## 2017-12-05 ENCOUNTER — Ambulatory Visit: Payer: Medicare Other

## 2017-12-13 DIAGNOSIS — M816 Localized osteoporosis [Lequesne]: Secondary | ICD-10-CM | POA: Diagnosis not present

## 2017-12-13 DIAGNOSIS — N958 Other specified menopausal and perimenopausal disorders: Secondary | ICD-10-CM | POA: Diagnosis not present

## 2017-12-13 DIAGNOSIS — Z6821 Body mass index (BMI) 21.0-21.9, adult: Secondary | ICD-10-CM | POA: Diagnosis not present

## 2017-12-13 DIAGNOSIS — Z124 Encounter for screening for malignant neoplasm of cervix: Secondary | ICD-10-CM | POA: Diagnosis not present

## 2017-12-15 DIAGNOSIS — Z9484 Stem cells transplant status: Secondary | ICD-10-CM | POA: Diagnosis not present

## 2017-12-15 DIAGNOSIS — C9002 Multiple myeloma in relapse: Secondary | ICD-10-CM | POA: Diagnosis not present

## 2018-01-10 ENCOUNTER — Other Ambulatory Visit: Payer: Self-pay

## 2018-01-10 ENCOUNTER — Ambulatory Visit (INDEPENDENT_AMBULATORY_CARE_PROVIDER_SITE_OTHER): Payer: Medicare Other | Admitting: Neurology

## 2018-01-10 ENCOUNTER — Encounter: Payer: Self-pay | Admitting: Neurology

## 2018-01-10 VITALS — BP 94/60 | HR 77 | Ht 63.0 in | Wt 121.0 lb

## 2018-01-10 DIAGNOSIS — G40B09 Juvenile myoclonic epilepsy, not intractable, without status epilepticus: Secondary | ICD-10-CM

## 2018-01-10 MED ORDER — LEVETIRACETAM 500 MG PO TABS: ORAL_TABLET | ORAL | 11 refills | Status: DC

## 2018-01-10 NOTE — Progress Notes (Signed)
NEUROLOGY FOLLOW UP OFFICE NOTE  Catherine Bullock [id number] Sep 26, 1946  HISTORY OF PRESENT ILLNESS: I had the pleasure of seeing Catherine Bullock in follow-up in the neurology clinic on 01/10/2018.  The patient was last seen 5 months ago for seizures. She has a diagnosis of juvenile myoclonic epilepsy since age 72, seizures usually start with myoclonic jerks that progress to a convulsion, she has not had any such seizures since 1987, however recently had 2 provoked seizures. The first event occurred on 02/13/17. She reports recalling the entire episode, and symptoms mostly consisted of inability to make any decisions. Her husband noted she left her pocketbook when she went in to [place], and it took her a long time to decided what she wanted. They went home and she could not decide what to wear to a ballgame. She came out in her underwear saying she was ready to go, her husband reminded her she was still in her underwear. He called EMS and she reports symptoms got better before they arrived. The episode was attributed to suddenly stopping her Dilantin about 5 weeks prior. She was on Keppra 566m BID and dose was increased in the ER to 10052mBID. The second event occurred on 05/02/17. She had her first dose of high dose Melphalan a few hours prior, in preparation for stem cell therapy. They went back to the hotel and after taking her first bite of food, her husband reported a convulsion lasting a few minutes with post-ictal confusion. This was brief, by the time EMS arrived, she was back to baseline. [facility] as she received her autologous stem cell transplant as an inpatient, with no further seizures. Keppra dose was increased to 150091mID. She reports doing very well with no further similar symptoms since then. She is tolerating higher dose Keppra without side effects except for a little fatigue. She denies any headaches, dizziness, diplopia, no falls. She started noticing  tingling in the fingers of both hands since Revlimid was restarted, she is on a lower dose. Feet are unaffected. She reports her multiple myeloma is in "stingent remission."   She had an MRI brain with and without contrast done 02/24/17 which did not show any acute changes, hippocampi symmetric with no abnormal signal or enhancement seen.  HPI 01/12/2017: This is a pleasant [age] RH with a history of multiple myeloma, hypertension, juvenile myoclonic epilepsy. She had her first seizure in the 9th grade, at that time attributed to sleep deprivation and stress. She had her second seizure in 1972, and had several more until 1987. She started Dilantin in 1976 and would have breakthrough seizures due to medication compliance. She reports seizures always started with myoclonic jerks that would proceed to a convulsion. She denies any myoclonic jerks or convulsions since 1987, she has been very compliant with Dilantin 200m41m AM, 100mg6mPM with no side effects. She and her husband deny any staring episodes. She recalls one time in 1987 when she missed doses of Dilantin, she was having some cognitive changes, then came to driving to her parents' house with no recollection of events. This has not recurred since then. She denies any olfactory/gustatory hallucinations, deja vu, rising epigastric sensation, focal numbness/tingling/weakness. She has been undergoing treatment for multiple myeloma, and was noted to have pancytopenia due to interaction of lenalidomide with her Dilantin. Per records, levels have started to recover. She was switched to Keppr[place] week, instructed to stop Dilantin and start Keppra. She  has been off Dilantin for a week and taking Keppra 553m BID with no side effects. She denies any myoclonic jerks or convulsions. She has minimized driving. She denies any headaches, dizziness, diplopia, dysarthria/dysphagia, neck/back pain, bowel/bladder dysfunction. No falls.  Epilepsy Risk Factors:  Her son  started had 2 seizures as a teenager and is also taking Keppra. Otherwise she had a normal birth and early development.  There is no history of febrile convulsions, CNS infections such as meningitis/encephalitis, significant traumatic brain injury, neurosurgical procedures.  PAST MEDICAL HISTORY: Past Medical History:  Diagnosis Date  . Anxiety   . Colon adenoma   . Degenerative disk disease   . [place]ile[place] ([place]    last seizure 1987  . GERD (gastroesophageal reflux disease)   . Hypertension   . IgG monoclonal gammopathy   . Iron deficiency anemia   . Multiple myeloma ([place] 11/08/2016  . MVP (mitral valve prolapse)   . Osteoporosis   . [place]ocytosis ([place]     MEDICATIONS: Current Outpatient Medications on File Prior to Visit  Medication Sig Dispense Refill  . acyclovir (ZOVIRAX) 200 MG capsule Take 1 capsule (200 mg total) by mouth 2 (two) times daily. 60 capsule 2  . ALPRAZolam (XANAX) 0.25 MG tablet Take 1 tablet (0.25 mg total) by mouth every 8 (eight) hours as needed. 30 tablet 1  . aspirin EC 81 MG tablet Take 81 mg by mouth daily.    . Calcium-Vitamin D-Vitamin K 500-100-40 MG-UNT-MCG CHEW Chew 2 capsules by mouth daily.    .Marland Kitchendocusate sodium (COLACE) 100 MG capsule Take 100 mg by mouth 2 (two) times daily.    .Marland Kitchenescitalopram (LEXAPRO) 20 MG tablet Take 20 mg by mouth daily.    .Marland KitchenlevETIRAcetam (KEPPRA) 500 MG tablet Take 3 tablets twice a day 180 tablet 11  . Multiple Vitamin (MULTIVITAMIN) tablet Take 1 tablet by mouth daily.    . valsartan (DIOVAN) 80 MG tablet Take 80 mg by mouth daily.  1   No current facility-administered medications on file prior to visit.     ALLERGIES: No Known Allergies  FAMILY HISTORY: Family History  Problem Relation Age of Onset  . Nephrolithiasis Mother   . Alzheimer's disease Mother   . Ulcers Mother   . Prostate cancer Father   . Multiple myeloma Father   . Colon cancer Neg Hx     SOCIAL HISTORY: Social History   Socioeconomic  History  . Marital status: Married    Spouse name: Not on file  . Number of children: Not on file  . Years of education: Not on file  . Highest education level: Not on file  Social Needs  . Financial resource strain: Not on file  . Food insecurity - worry: Not on file  . Food insecurity - inability: Not on file  . Transportation needs - medical: Not on file  . Transportation needs - non-medical: Not on file  Occupational History  . Not on file  Tobacco Use  . Smoking status: Former Smoker    Last attempt to quit: 06/21/1979    Years since quitting: 38.5  . Smokeless tobacco: Never Used  Substance and Sexual Activity  . Alcohol use: Yes    Alcohol/week: 1.2 oz    Types: 2 Glasses of wine per week  . Drug use: No  . Sexual activity: Yes  Other Topics Concern  . Not on file  Social History Narrative  . Not on file    REVIEW OF SYSTEMS:  Constitutional: No fevers, chills, or sweats, no generalized fatigue, change in appetite Eyes: No visual changes, double vision, eye pain Ear, nose and throat: No hearing loss, ear pain, nasal congestion, sore throat Cardiovascular: No chest pain, palpitations Respiratory:  No shortness of breath at rest or with exertion, wheezes GastrointestinaI: No nausea, vomiting, diarrhea, abdominal pain, fecal incontinence Genitourinary:  No dysuria, urinary retention or frequency Musculoskeletal:  No neck pain, back pain Integumentary: No rash, pruritus, skin lesions Neurological: as above Psychiatric: No depression, insomnia, anxiety Endocrine: No palpitations, fatigue, diaphoresis, mood swings, change in appetite, change in weight, increased thirst Hematologic/Lymphatic:  No anemia, purpura, petechiae. Allergic/Immunologic: no itchy/runny eyes, nasal congestion, recent allergic reactions, rashes  PHYSICAL EXAM: Vitals:   01/10/18 1406  BP: 94/60  Pulse: 77  SpO2: 98%   General: No acute distress Head:  Normocephalic/atraumatic Neck: supple,  no paraspinal tenderness, full range of motion Heart:  Regular rate and rhythm Lungs:  Clear to auscultation bilaterally Back: No paraspinal tenderness Skin/Extremities: No rash, no edema Neurological Exam: alert and oriented to person, place, and time. No aphasia or dysarthria. Fund of knowledge is appropriate.  Recent and remote memory are intact. 3/3 delayed recall.  Attention and concentration are normal.    Able to name objects and repeat phrases. Cranial nerves: Pupils equal, round, reactive to light. Extraocular movements intact with no nystagmus. Visual fields full. Facial sensation intact. No facial asymmetry. Tongue, uvula, palate midline.  Motor: Bulk and tone normal, muscle strength 5/5 throughout with no pronator drift.  Sensation to light touch intact.  No extinction to double simultaneous stimulation.  Deep tendon reflexes 2+ throughout, toes downgoing.  Finger to nose testing intact.  Gait narrow-based and steady, able to tandem walk adequately.  Romberg negative.  IMPRESSION: This is a pleasant 72 yo RH woman with a history of juvenile myoclonic epilepsy, who had been seizure-free since 1987 on chronic Dilantin therapy. She has multiple myeloma, and had pancytopenia from interaction of Dilantin with lenalidomide. Dilantin was discontinued by her oncologist and she was started on Keppra. She denies any of her typical seizures, but had an episode where she was indecisive (which is unusual) last April 2018, a few weeks after stopping Dilantin. Keppra dose increased. On 05/02/17, a few hours after first dose of Melphalan, she had a witnessed convulsion. Seizures can rarely be seen after high dose Melphalan. The seizure was not typical of her usual seizures, no preceding myoclonus. This appears to have been provoked by medication. Keppra dose increased to 1562m BID (takes 504m3 tabs BID due to swallowing difficulties post-chemo). No further seizures or seizure-like symptoms since then, she is  interested in reducing Keppra, we discussed tapering back to 100079mID. We discussed risks for breakthrough seizure with any medication adjustment, she knows to call our office for any changes. She is aware of [place] driving laws to stop driving after a seizure until 6 months seizure-free. She will follow-up in 6 months and knows to call for any changes.   Thank you for allowing me to participate in her care.  Please do not hesitate to call for any questions or concerns.  The duration of this appointment visit was 25 minutes of face-to-face time with the patient.  Greater than 50% of this time was spent in counseling, explanation of diagnosis, planning of further management, and coordination of care.   KarEllouise Newer.D.   CC: Dr. SmiTamala Julian

## 2018-01-10 NOTE — Patient Instructions (Addendum)
1. Start reducing Keppra 500mg : Take 2 tablets in AM, 3 tablets in PM for 2 weeks, then reduce to 2 tablets twice a day 2. Follow-up in 6 months, call for any changes  Seizure Precautions: 1. If medication has been prescribed for you to prevent seizures, take it exactly as directed.  Do not stop taking the medicine without talking to your doctor first, even if you have not had a seizure in a long time.   2. Avoid activities in which a seizure would cause danger to yourself or to others.  Don't operate dangerous machinery, swim alone, or climb in high or dangerous places, such as on ladders, roofs, or girders.  Do not drive unless your doctor says you may.  3. If you have any warning that you may have a seizure, lay down in a safe place where you can't hurt yourself.    4.  No driving for 6 months from last seizure, as per [facility].   Please refer to the following link on the [place] website for more information: http://www.epilepsyfoundation.org/answerplace/Social/driving/drivingu.cfm   5.  Maintain good sleep hygiene. Avoid alcohol.  6.  Contact your doctor if you have any problems that may be related to the medicine you are taking.  7.  Call 911 and bring the patient back to the ED if:        A.  The seizure lasts longer than 5 minutes.       B.  The patient doesn't awaken shortly after the seizure  C.  The patient has new problems such as difficulty seeing, speaking or moving  D.  The patient was injured during the seizure  E.  The patient has a temperature over 102 F (39C)  F.  The patient vomited and now is having trouble breathing

## 2018-01-13 ENCOUNTER — Encounter: Payer: Self-pay | Admitting: Neurology

## 2018-01-16 DIAGNOSIS — C9002 Multiple myeloma in relapse: Secondary | ICD-10-CM | POA: Diagnosis not present

## 2018-01-16 DIAGNOSIS — Z9484 Stem cells transplant status: Secondary | ICD-10-CM | POA: Diagnosis not present

## 2018-01-25 ENCOUNTER — Ambulatory Visit
Admission: RE | Admit: 2018-01-25 | Discharge: 2018-01-25 | Disposition: A | Discharge status: Home / Self Care | Payer: Medicare Other | Source: Ambulatory Visit | Attending: Obstetrics and Gynecology | Admitting: Obstetrics and Gynecology

## 2018-01-25 DIAGNOSIS — Z1231 Encounter for screening mammogram for malignant neoplasm of breast: Secondary | ICD-10-CM | POA: Diagnosis not present

## 2018-02-03 DIAGNOSIS — J069 Acute upper respiratory infection, unspecified: Secondary | ICD-10-CM | POA: Diagnosis not present

## 2018-02-06 DIAGNOSIS — C9002 Multiple myeloma in relapse: Secondary | ICD-10-CM | POA: Diagnosis not present

## 2018-02-06 DIAGNOSIS — Z9484 Stem cells transplant status: Secondary | ICD-10-CM | POA: Diagnosis not present

## 2018-02-06 DIAGNOSIS — D473 Essential (hemorrhagic) thrombocythemia: Secondary | ICD-10-CM | POA: Diagnosis not present

## 2018-02-06 DIAGNOSIS — G40909 Epilepsy, unspecified, not intractable, without status epilepticus: Secondary | ICD-10-CM | POA: Diagnosis not present

## 2018-02-06 DIAGNOSIS — Z79899 Other long term (current) drug therapy: Secondary | ICD-10-CM | POA: Diagnosis not present

## 2018-02-06 DIAGNOSIS — I10 Essential (primary) hypertension: Secondary | ICD-10-CM | POA: Diagnosis not present

## 2018-02-06 DIAGNOSIS — Z23 Encounter for immunization: Secondary | ICD-10-CM | POA: Diagnosis not present

## 2018-02-06 DIAGNOSIS — C9001 Multiple myeloma in remission: Secondary | ICD-10-CM | POA: Diagnosis not present

## 2018-02-06 DIAGNOSIS — G629 Polyneuropathy, unspecified: Secondary | ICD-10-CM | POA: Diagnosis not present

## 2018-03-20 DIAGNOSIS — C9000 Multiple myeloma not having achieved remission: Secondary | ICD-10-CM | POA: Diagnosis not present

## 2018-03-20 DIAGNOSIS — C9002 Multiple myeloma in relapse: Secondary | ICD-10-CM | POA: Diagnosis not present

## 2018-03-20 DIAGNOSIS — Z9484 Stem cells transplant status: Secondary | ICD-10-CM | POA: Diagnosis not present

## 2018-03-20 DIAGNOSIS — G629 Polyneuropathy, unspecified: Secondary | ICD-10-CM | POA: Diagnosis not present

## 2018-04-17 DIAGNOSIS — Z9484 Stem cells transplant status: Secondary | ICD-10-CM | POA: Diagnosis not present

## 2018-04-17 DIAGNOSIS — C9002 Multiple myeloma in relapse: Secondary | ICD-10-CM | POA: Diagnosis not present

## 2018-05-02 DIAGNOSIS — Z9484 Stem cells transplant status: Secondary | ICD-10-CM | POA: Diagnosis not present

## 2018-05-02 DIAGNOSIS — C9002 Multiple myeloma in relapse: Secondary | ICD-10-CM | POA: Diagnosis not present

## 2018-05-10 DIAGNOSIS — C9001 Multiple myeloma in remission: Secondary | ICD-10-CM | POA: Diagnosis not present

## 2018-05-10 DIAGNOSIS — Z9484 Stem cells transplant status: Secondary | ICD-10-CM | POA: Diagnosis not present

## 2018-05-10 DIAGNOSIS — Z23 Encounter for immunization: Secondary | ICD-10-CM | POA: Diagnosis not present

## 2018-05-10 DIAGNOSIS — D473 Essential (hemorrhagic) thrombocythemia: Secondary | ICD-10-CM | POA: Diagnosis not present

## 2018-05-20 IMAGING — CR DG LUMBAR SPINE 2-3V
3 series · 3 of 3 positions shown · non-contrast
Comparison: Plain films lumbar spine 12/28/2005 and 08/09/2014.

CLINICAL DATA: Right side low back and lower extremity pain for 2
months since running a 5k. no known injury.

EXAM:
LUMBAR SPINE - 2-3 VIEW

[w lumbar spine ap]
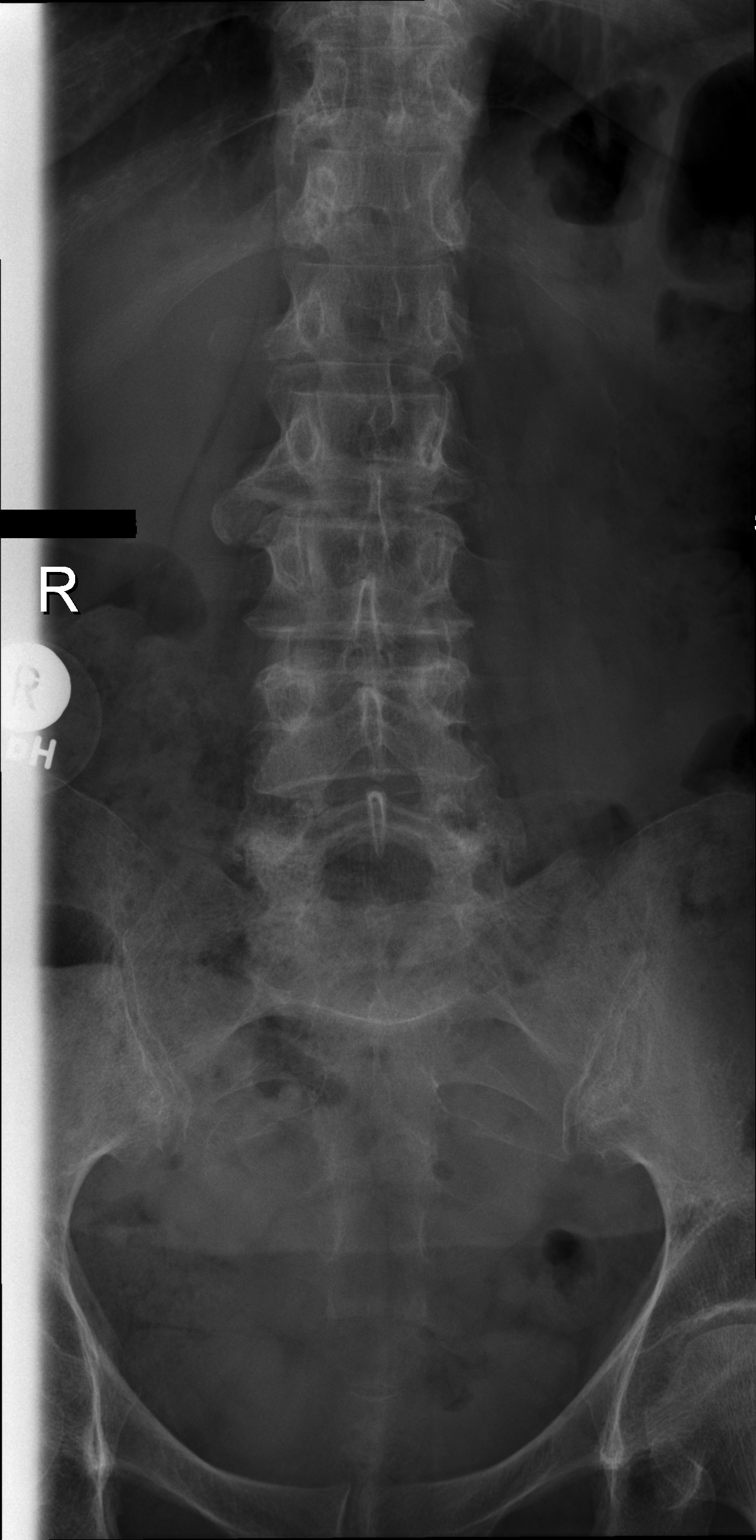

[w lumbar spine lat]
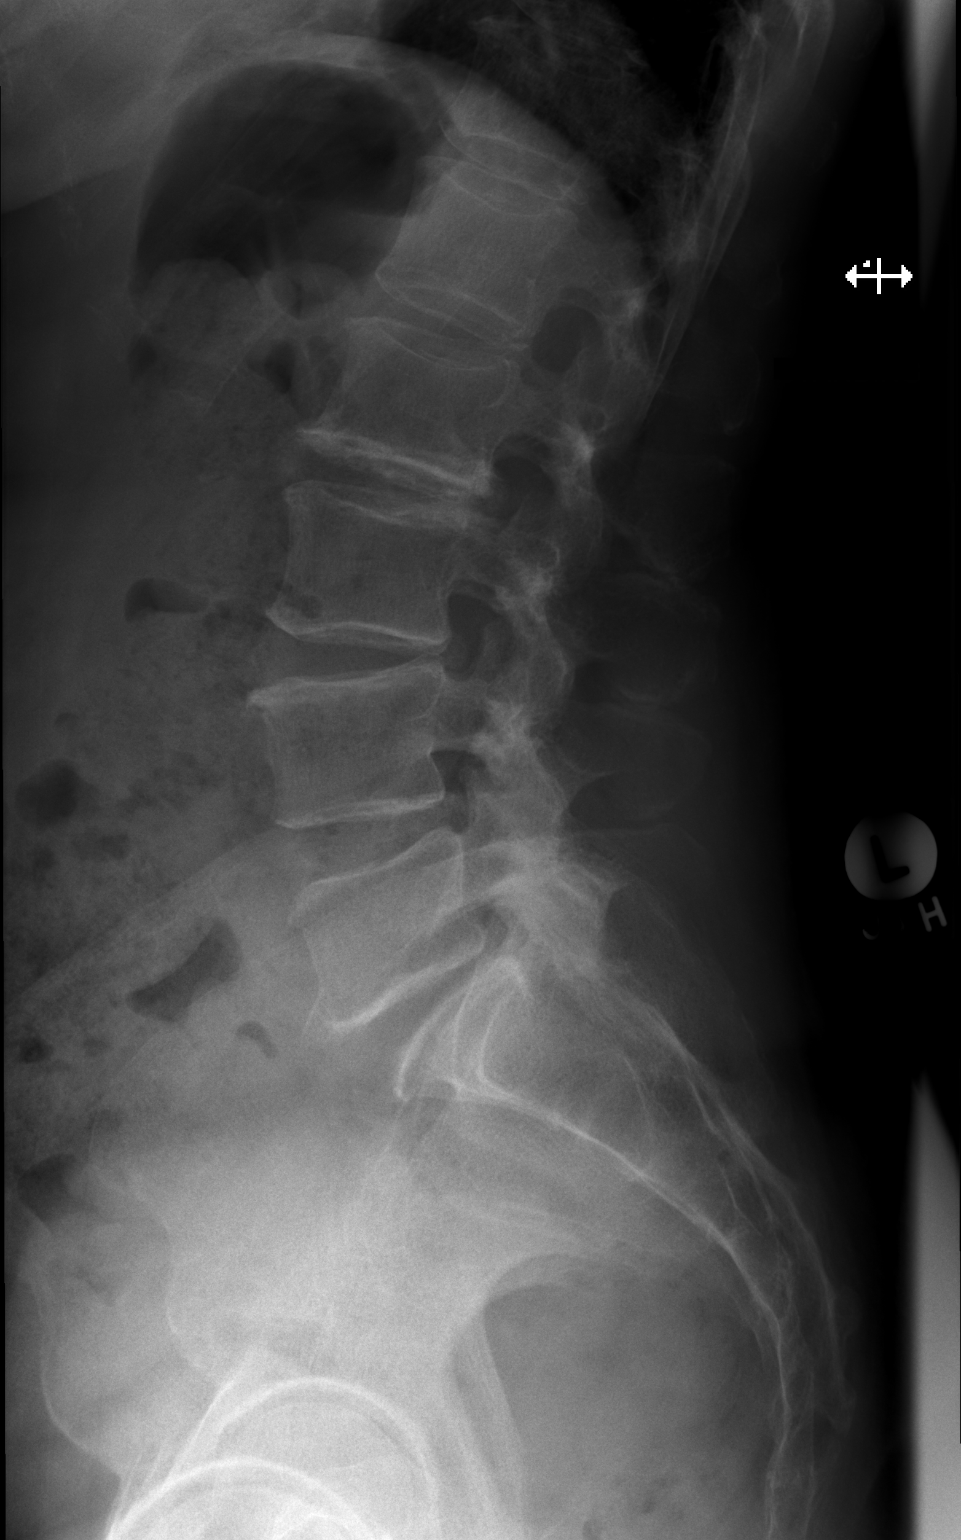

[w lumbar l-5 s-1 spot]
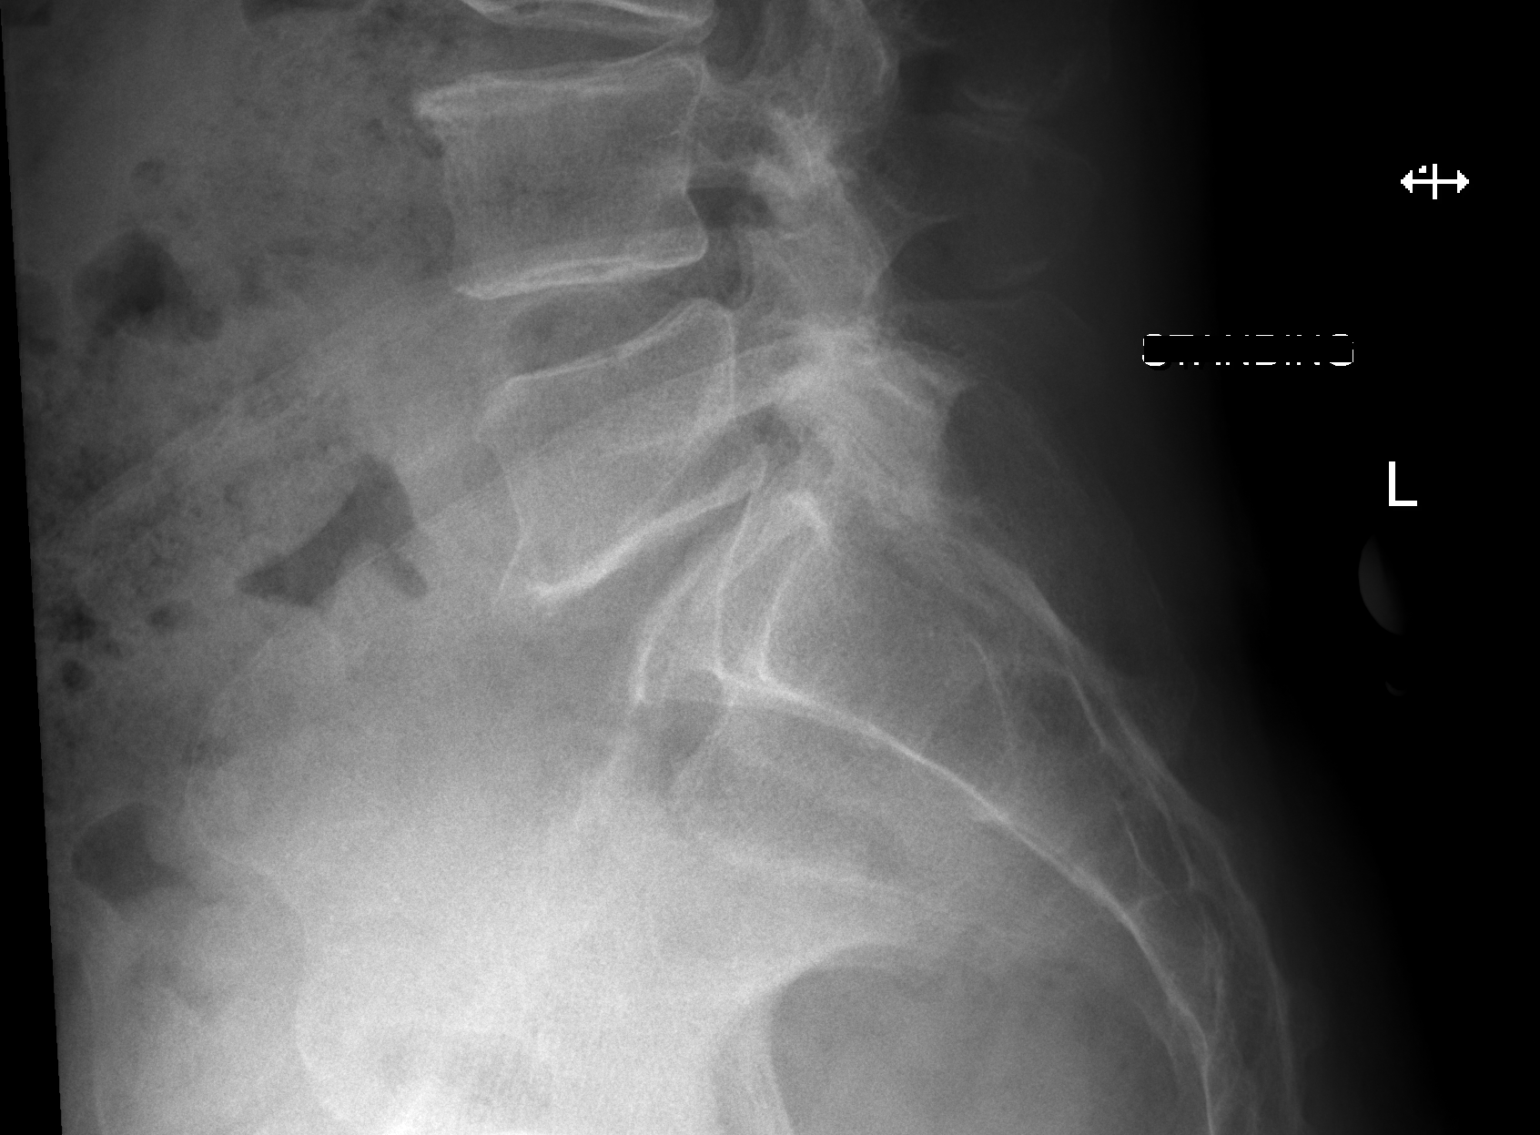

[3 of 3 positions shown; findings below may reference images not displayed]

FINDINGS: No fracture or focal bony lesion is identified. Loss of disc space
height and endplate spurring are most notable at L2-3. Facet
arthropathy appears worst at L5-S1. Paraspinous structures are
unremarkable.
IMPRESSION: No acute abnormality. No change in the appearance of lumbar
spondylosis since the most recent exam.

## 2018-06-19 DIAGNOSIS — J029 Acute pharyngitis, unspecified: Secondary | ICD-10-CM | POA: Diagnosis not present

## 2018-06-23 DIAGNOSIS — M546 Pain in thoracic spine: Secondary | ICD-10-CM | POA: Diagnosis not present

## 2018-07-20 DIAGNOSIS — M7052 Other bursitis of knee, left knee: Secondary | ICD-10-CM | POA: Diagnosis not present

## 2018-07-20 IMAGING — MR MR LUMBAR SPINE W/O CM
4 of 5 series · 18 of 48 positions shown · non-contrast
Comparison: 12/29/2005

CLINICAL DATA: Acute midline low back pain with right-sided
sciatica.

EXAM:
MRI LUMBAR SPINE WITHOUT CONTRAST
TECHNIQUE: Multiplanar, multisequence MR imaging of the lumbar spine was
performed. No intravenous contrast was administered.

[Series 6: T2 · sagittal · 4.0mm · 0.73mm/px · 6 of 15 slices shown (1 of 2)]
[im 1/15]
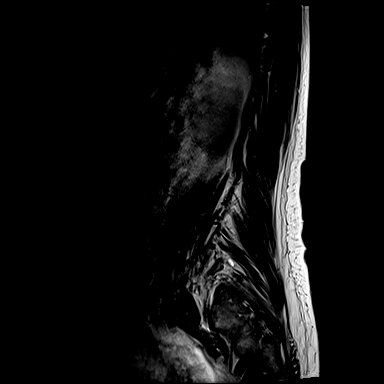
[im 3/15]
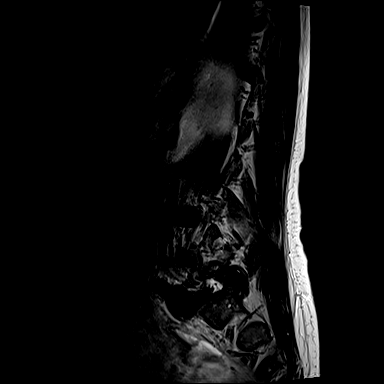
[im 6/15]
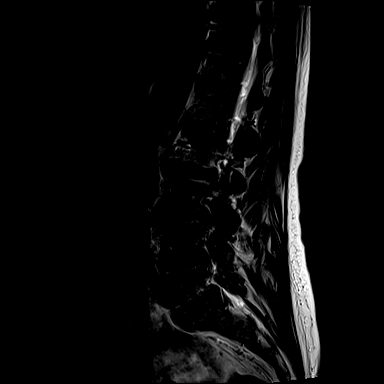
[im 9/15]
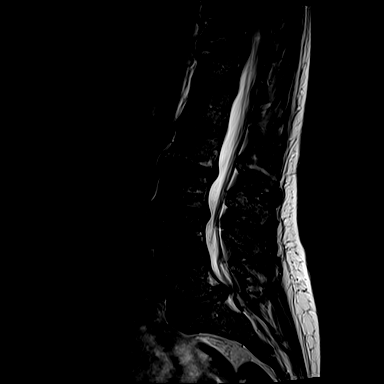
[im 12/15]
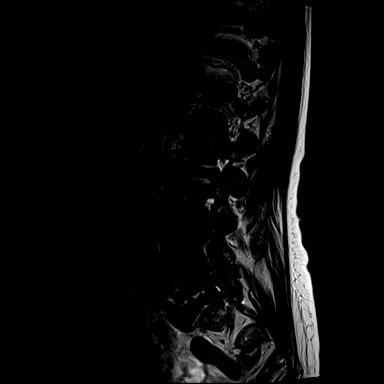
[im 15/15]
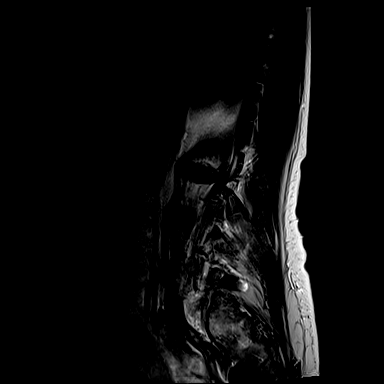

[Series 7: T1 · sagittal · 4.0mm · 0.73mm/px · 3 of 15 slices shown (1 of 2)]
[im 3/15]
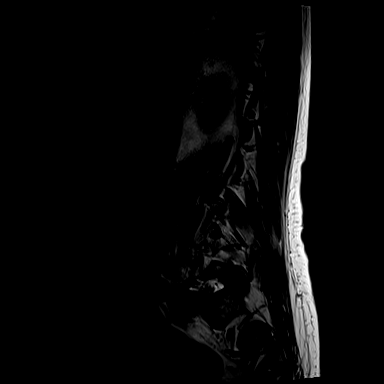
[im 9/15]
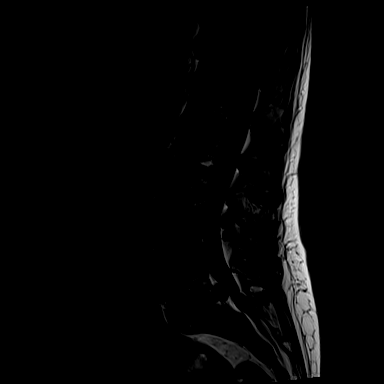
[im 15/15]
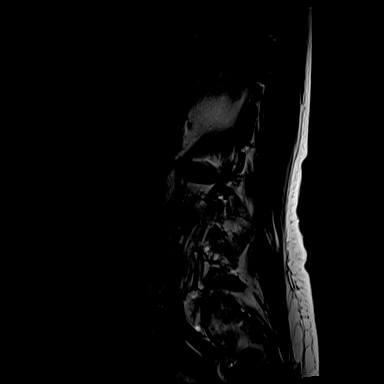

[Series 11: T1 · axial · 4.0mm · 0.28mm/px · z∈[-99,+44]mm · 3 of 35 slices shown (2 of 2)]
[im 5/35]
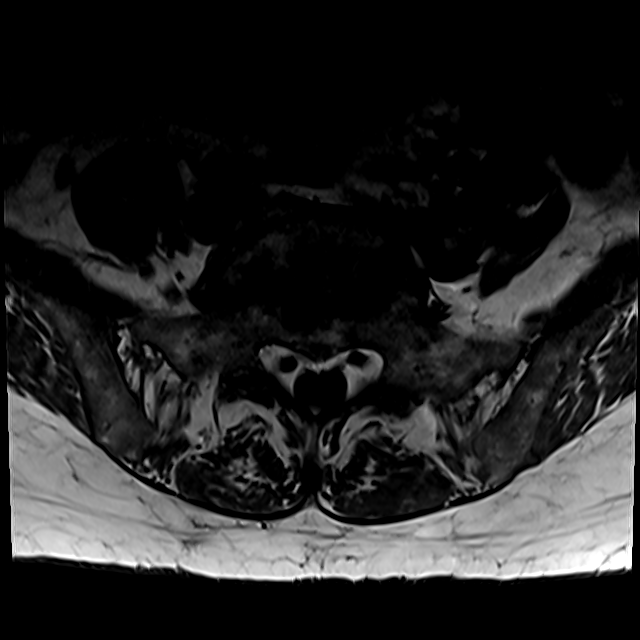
[im 18/35]
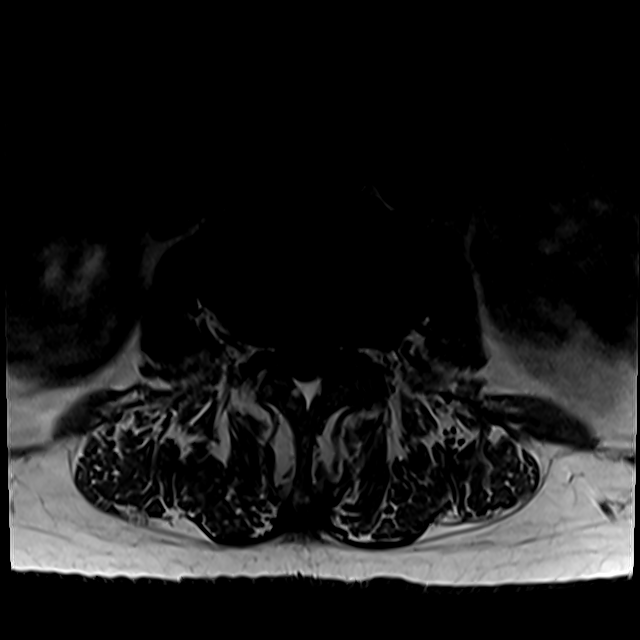
[im 30/35]
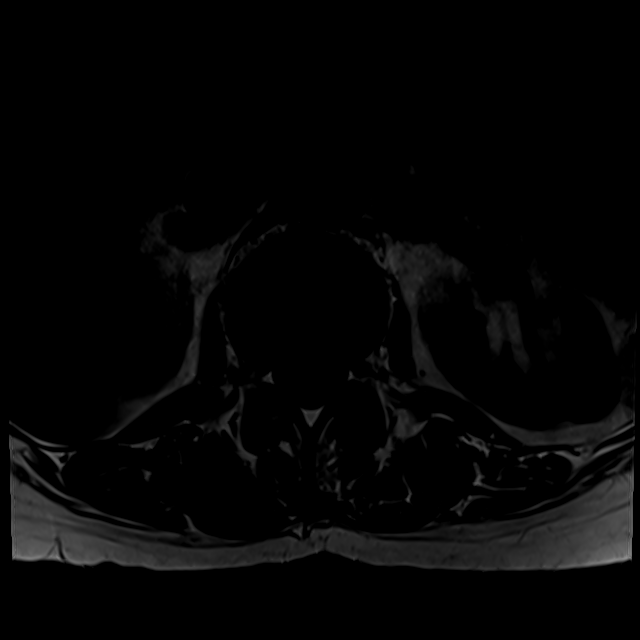

[Series 14: T2 · axial · 4.0mm · 0.28mm/px · z∈[-119,+44]mm · 6 of 35 slices shown (2 of 2)]
[im 1/35]
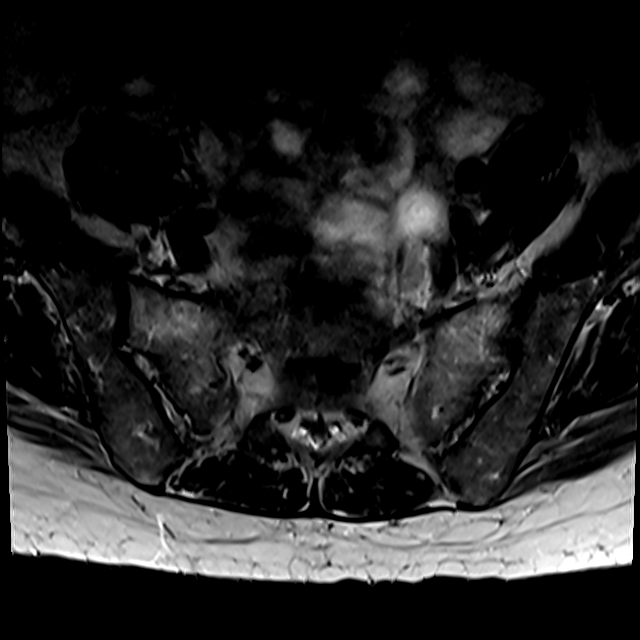
[im 5/35]
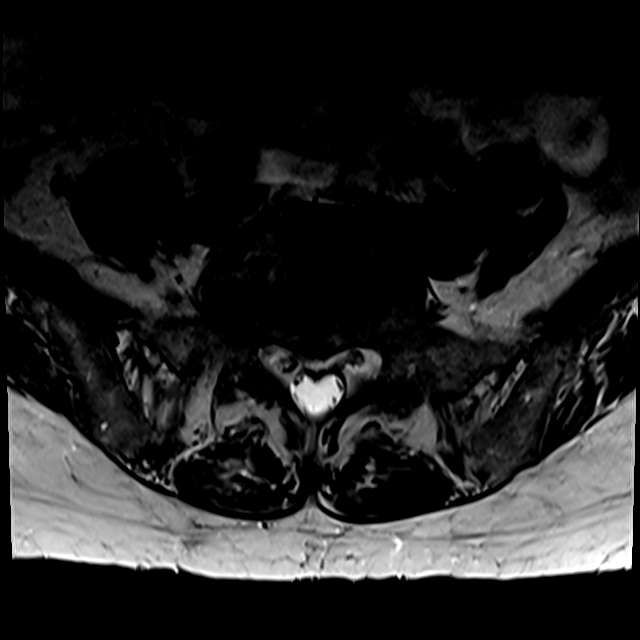
[im 10/35]
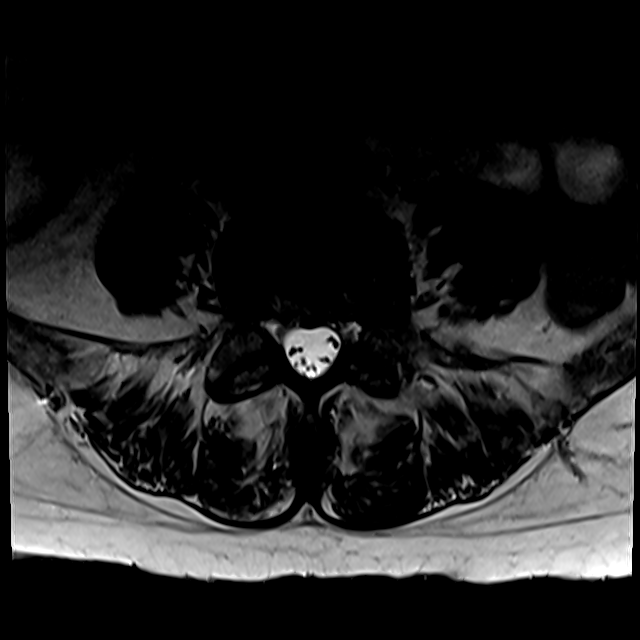
[im 15/35]
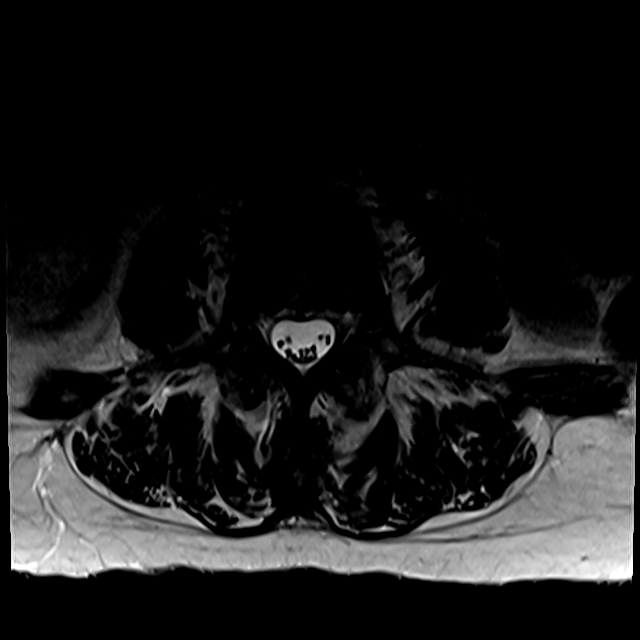
[im 18/35]
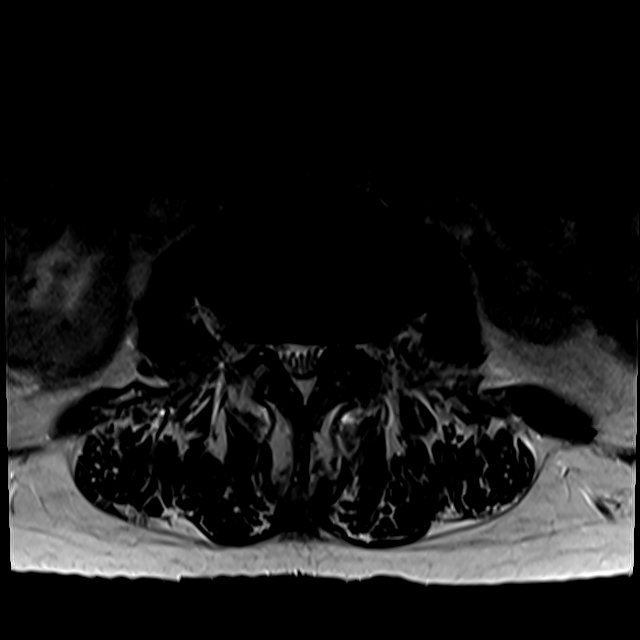
[im 30/35]
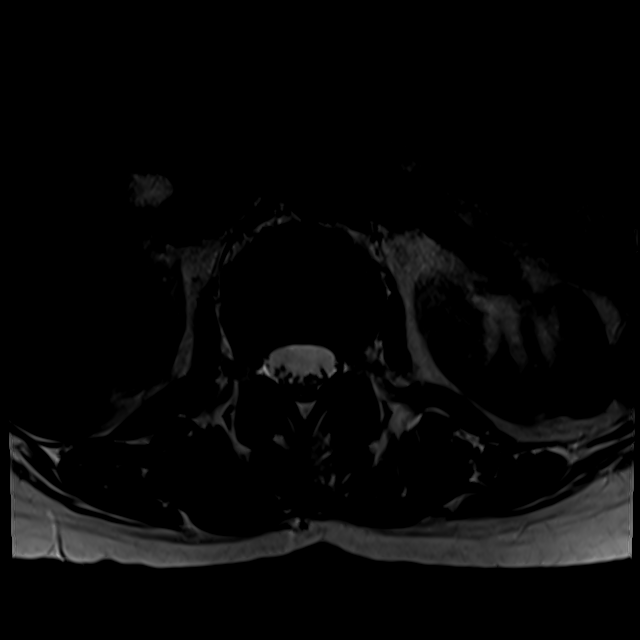

[18 of 48 positions shown; findings below may reference images not displayed]

FINDINGS: Segmentation:  Standard.

Alignment:  Slight L5-S1 anterolisthesis.

Vertebrae: Hypo intense appearance of the marrow diffusely without
focal mass lesion. No acute fracture or discitis.

Conus medullaris: Extends to the L1 level and appears normal.

Paraspinal and other soft tissues: Negative

Disc levels:

T12- L1: Unremarkable.

L1-L2: Unremarkable.

L2-L3: Disc narrowing and bulging with right extra foraminal
protrusion contacting the L2 nerve, similar to prior.

L3-L4: Progressed disc narrowing and bulging. Progressed facet
arthropathy with ligamentum flavum thickening. Left far-lateral disc
protrusion without L3 impingement, new. New mild left subarticular
recess stenosis.

L4-L5: Progressed facet arthropathy with ligament hypertrophy and
spurring. New mild left subarticular recess narrowing. Patent
foramina

L5-S1:Chronic large right paracentral disc protrusion is increased
from prior, with greater mass effect on the descending S1 nerve.
There is also now a foraminal component with L5 impingement.
IMPRESSION: 1. L5-S1 right paracentral to foraminal disc protrusion with S1 and
L5 impingement, progressed from 5000.
2. L4-5 new mild left subarticular recess stenosis.
3. L3-4 left extra foraminal protrusion that is new but
noncompressive. Mild left subarticular recess also at this level.
4. L2-3 chronic right extra foraminal disc protrusion with L2
contact.
5. Generalized increase in degenerative disc narrowing and facet
spurring.
6. Unexpectedly marrow signal presumably related to patient's
history of anemia and monoclonal gammopathy. No focal vertebral mass
lesion.

## 2018-07-21 ENCOUNTER — Other Ambulatory Visit: Payer: Self-pay

## 2018-07-21 ENCOUNTER — Encounter: Payer: Self-pay | Admitting: Neurology

## 2018-07-21 ENCOUNTER — Ambulatory Visit (INDEPENDENT_AMBULATORY_CARE_PROVIDER_SITE_OTHER): Payer: Medicare Other | Admitting: Neurology

## 2018-07-21 DIAGNOSIS — G40B09 Juvenile myoclonic epilepsy, not intractable, without status epilepticus: Secondary | ICD-10-CM | POA: Diagnosis not present

## 2018-07-21 MED ORDER — LEVETIRACETAM 500 MG PO TABS: ORAL_TABLET | ORAL | 3 refills | Status: DC

## 2018-07-21 NOTE — Progress Notes (Signed)
NEUROLOGY FOLLOW UP OFFICE NOTE  Catherine Bullock [id number] [birth year]  HISTORY OF PRESENT ILLNESS: I had the pleasure of seeing Catherine Bullock in follow-up in the neurology clinic on 07/21/2018.  The patient was last seen 6 months ago for seizures. She has a diagnosis of juvenile myoclonic epilepsy since age 72, seizures usually start with myoclonic jerks that progress to a convulsion, she has not had any such seizures since 1987 until she had 2 provoked seizures in April 2018, then in July 2018. The first seziure occurred during medication change, the second occurred after her first dose of Melphalan a few hours prior. She has been seizure-free since then, and requested to reduce Keppra to 1010m BID on last visit. She continues to do well with no further seizures or seizure-like symptoms. She denies any staring/unrespnosive episodes/gaps in time, myoclonic jerks. She denies any headaches, dizziness, vision changes, no falls. She barely notices paresthesias from chemo. She is on Revlimid.   She had an MRI brain with and without contrast done 02/24/17 which did not show any acute changes, hippocampi symmetric with no abnormal signal or enhancement seen.  HPI 01/12/2017: This is a pleasant 72yo RH with a history of multiple myeloma, hypertension, juvenile myoclonic epilepsy. She had her first seizure in the 9th grade, at that time attributed to sleep deprivation and stress. She had her second seizure in 1972, and had several more until 1987. She started Dilantin in 1976 and would have breakthrough seizures due to medication compliance. She reports seizures always started with myoclonic jerks that would proceed to a convulsion. She denies any myoclonic jerks or convulsions since 1987, she has been very compliant with Dilantin 2036min AM, 10056mn PM with no side effects. She and her husband deny any staring episodes. She recalls one time in 1987 when she missed doses of Dilantin, she was having some cognitive  changes, then came to driving to her parents' house with no recollection of events. This has not recurred since then. She denies any olfactory/gustatory hallucinations, deja vu, rising epigastric sensation, focal numbness/tingling/weakness. She has been undergoing treatment for multiple myeloma, and was noted to have pancytopenia due to interaction of lenalidomide with her Dilantin. Per records, levels have started to recover. She was switched to [place] week, instructed to stop Dilantin and start Keppra. She has been off Dilantin for a week and taking Keppra 500m35mD with no side effects. She denies any myoclonic jerks or convulsions. She has minimized driving. She denies any headaches, dizziness, diplopia, dysarthria/dysphagia, neck/back pain, bowel/bladder dysfunction. No falls.  Epilepsy Risk Factors:  Her son started had 2 seizures as a teenager and is also taking Keppra. Otherwise she had a normal birth and early development.  There is no history of febrile convulsions, CNS infections such as meningitis/encephalitis, significant traumatic brain injury, neurosurgical procedures.  PAST MEDICAL HISTORY: Past Medical History:  Diagnosis Date  . Anxiety   . Colon adenoma   . Degenerative disk disease   . Epilepsy (HCC)[place] last seizure 1987  . GERD (gastroesophageal reflux disease)   . Hypertension   . IgG monoclonal gammopathy   . Iron deficiency anemia   . Multiple myeloma (HCC)[place]8/2018  . MVP (mitral valve prolapse)   . Osteoporosis   . Thrombocytosis (HCC)[place]  MEDICATIONS: Current Outpatient Medications on File Prior to Visit  Medication Sig Dispense Refill  . ALPRAZolam (XANAX) 0.25 MG tablet Take 1 tablet (0.25 mg total) by mouth  every 8 (eight) hours as needed. 30 tablet 1  . aspirin EC 81 MG tablet Take 81 mg by mouth daily.    Marland Kitchen escitalopram (LEXAPRO) 20 MG tablet Take 20 mg by mouth daily.    Marland Kitchen levETIRAcetam (KEPPRA) 500 MG tablet Take 2 tablets twice a day 120 tablet 11    . Multiple Vitamin (MULTIVITAMIN) tablet Take 1 tablet by mouth daily.    Marland Kitchen REVLIMID 5 MG capsule   0   No current facility-administered medications on file prior to visit.     ALLERGIES: No Known Allergies  FAMILY HISTORY: Family History  Problem Relation Age of Onset  . Nephrolithiasis Mother   . Alzheimer's disease Mother   . Ulcers Mother   . Prostate cancer Father   . Multiple myeloma Father   . Colon cancer Neg Hx     SOCIAL HISTORY: Social History   Socioeconomic History  . Marital status: Married    Spouse name: Not on file  . Number of children: Not on file  . Years of education: Not on file  . Highest education level: Not on file  Occupational History  . Not on file  Social Needs  . Financial resource strain: Not on file  . Food insecurity:    Worry: Not on file    Inability: Not on file  . Transportation needs:    Medical: Not on file    Non-medical: Not on file  Tobacco Use  . Smoking status: Former Smoker    Last attempt to quit: 06/21/1979    Years since quitting: 39.1  . Smokeless tobacco: Never Used  Substance and Sexual Activity  . Alcohol use: Yes    Alcohol/week: 2.0 standard drinks    Types: 2 Glasses of wine per week  . Drug use: No  . Sexual activity: Yes  Lifestyle  . Physical activity:    Days per week: Not on file    Minutes per session: Not on file  . Stress: Not on file  Relationships  . Social connections:    Talks on phone: Not on file    Gets together: Not on file    Attends religious service: Not on file    Active member of club or organization: Not on file    Attends meetings of clubs or organizations: Not on file    Relationship status: Not on file  . Intimate partner violence:    Fear of current or ex partner: Not on file    Emotionally abused: Not on file    Physically abused: Not on file    Forced sexual activity: Not on file  Other Topics Concern  . Not on file  Social History Narrative  . Not on file     REVIEW OF SYSTEMS: Constitutional: No fevers, chills, or sweats, no generalized fatigue, change in appetite Eyes: No visual changes, double vision, eye pain Ear, nose and throat: No hearing loss, ear pain, nasal congestion, sore throat Cardiovascular: No chest pain, palpitations Respiratory:  No shortness of breath at rest or with exertion, wheezes GastrointestinaI: No nausea, vomiting, diarrhea, abdominal pain, fecal incontinence Genitourinary:  No dysuria, urinary retention or frequency Musculoskeletal:  No neck pain, back pain Integumentary: No rash, pruritus, skin lesions Neurological: as above Psychiatric: No depression, insomnia, anxiety Endocrine: No palpitations, fatigue, diaphoresis, mood swings, change in appetite, change in weight, increased thirst Hematologic/Lymphatic:  No anemia, purpura, petechiae. Allergic/Immunologic: no itchy/runny eyes, nasal congestion, recent allergic reactions, rashes  PHYSICAL EXAM: Vitals:  07/21/18 1559  BP: 100/60  Pulse: (!) 58  SpO2: 98%   General: No acute distress Head:  Normocephalic/atraumatic Neck: supple, no paraspinal tenderness, full range of motion Heart:  Regular rate and rhythm Lungs:  Clear to auscultation bilaterally Back: No paraspinal tenderness Skin/Extremities: No rash, no edema Neurological Exam: alert and oriented to person, place, and time. No aphasia or dysarthria. Fund of knowledge is appropriate.  Recent and remote memory are intact. Attention and concentration are normal.    Able to name objects and repeat phrases. Cranial nerves: Pupils equal, round, reactive to light. Extraocular movements intact with no nystagmus. Visual fields full. Facial sensation intact. No facial asymmetry. Tongue, uvula, palate midline.  Motor: Bulk and tone normal, muscle strength 5/5 throughout with no pronator drift.  Sensation to light touch intact.  No extinction to double simultaneous stimulation.  Deep tendon reflexes 2+  throughout, toes downgoing.  Finger to nose testing intact.  Gait narrow-based and steady, able to tandem walk adequately.  Romberg negative.  IMPRESSION: This is a pleasant 72 yo RH woman with a history of juvenile myoclonic epilepsy, who had been seizure-free since 1987 on chronic Dilantin therapy, until she had 2 provoked seizures in April 2018 and July 2018. None since then, she is taking Keppra 1055m BID without side effects. Continue current dose, she was previously on 5095mBID prior to the seizures, we may reconsider on next visit. She is aware of [place] driving laws to stop driving after a seizure until 6 months seizure-free. She will follow-up in 8 months and knows to call for any changes.   Thank you for allowing me to participate in her care.  Please do not hesitate to call for any questions or concerns.  The duration of this appointment visit was 15 minutes of face-to-face time with the patient.  Greater than 50% of this time was spent in counseling, explanation of diagnosis, planning of further management, and coordination of care.   KaEllouise NewerM.D.   CC: Dr. SmTamala Julian

## 2018-07-21 NOTE — Patient Instructions (Signed)
Great seeing you! Continue Keppra 500mg , take 2 tablets twice a day. Follow-up in 8 months, call for any changes.  Seizure Precautions: 1. If medication has been prescribed for you to prevent seizures, take it exactly as directed.  Do not stop taking the medicine without talking to your doctor first, even if you have not had a seizure in a long time.   2. Avoid activities in which a seizure would cause danger to yourself or to others.  Don't operate dangerous machinery, swim alone, or climb in high or dangerous places, such as on ladders, roofs, or girders.  Do not drive unless your doctor says you may.  3. If you have any warning that you may have a seizure, lay down in a safe place where you can't hurt yourself.    4.  No driving for 6 months from last seizure, as per [facility].   Please refer to the following link on the [place] website for more information: http://www.epilepsyfoundation.org/answerplace/Social/driving/drivingu.cfm   5.  Maintain good sleep hygiene. Avoid alcohol.  6.  Contact your doctor if you have any problems that may be related to the medicine you are taking.  7.  Call 911 and bring the patient back to the ED if:        A.  The seizure lasts longer than 5 minutes.       B.  The patient doesn't awaken shortly after the seizure  C.  The patient has new problems such as difficulty seeing, speaking or moving  D.  The patient was injured during the seizure  E.  The patient has a temperature over 102 F (39C)  F.  The patient vomited and now is having trouble breathing

## 2018-08-09 DIAGNOSIS — Z79899 Other long term (current) drug therapy: Secondary | ICD-10-CM | POA: Diagnosis not present

## 2018-08-09 DIAGNOSIS — C9001 Multiple myeloma in remission: Secondary | ICD-10-CM | POA: Diagnosis not present

## 2018-08-09 DIAGNOSIS — D709 Neutropenia, unspecified: Secondary | ICD-10-CM | POA: Diagnosis not present

## 2018-08-09 DIAGNOSIS — G629 Polyneuropathy, unspecified: Secondary | ICD-10-CM | POA: Diagnosis not present

## 2018-08-09 DIAGNOSIS — Z9484 Stem cells transplant status: Secondary | ICD-10-CM | POA: Diagnosis not present

## 2018-08-09 DIAGNOSIS — Z7983 Long term (current) use of bisphosphonates: Secondary | ICD-10-CM | POA: Diagnosis not present

## 2018-08-09 DIAGNOSIS — Z48298 Encounter for aftercare following other organ transplant: Secondary | ICD-10-CM | POA: Diagnosis not present

## 2018-08-09 DIAGNOSIS — M25562 Pain in left knee: Secondary | ICD-10-CM | POA: Diagnosis not present

## 2018-08-09 DIAGNOSIS — C9000 Multiple myeloma not having achieved remission: Secondary | ICD-10-CM | POA: Diagnosis not present

## 2018-08-16 DIAGNOSIS — Z48298 Encounter for aftercare following other organ transplant: Secondary | ICD-10-CM | POA: Diagnosis not present

## 2018-08-16 DIAGNOSIS — G629 Polyneuropathy, unspecified: Secondary | ICD-10-CM | POA: Diagnosis not present

## 2018-08-16 DIAGNOSIS — Z9484 Stem cells transplant status: Secondary | ICD-10-CM | POA: Diagnosis not present

## 2018-08-16 DIAGNOSIS — Z7983 Long term (current) use of bisphosphonates: Secondary | ICD-10-CM | POA: Diagnosis not present

## 2018-08-16 DIAGNOSIS — Z79899 Other long term (current) drug therapy: Secondary | ICD-10-CM | POA: Diagnosis not present

## 2018-08-16 DIAGNOSIS — C9001 Multiple myeloma in remission: Secondary | ICD-10-CM | POA: Diagnosis not present

## 2018-08-16 DIAGNOSIS — Z23 Encounter for immunization: Secondary | ICD-10-CM | POA: Diagnosis not present

## 2018-09-19 DIAGNOSIS — F419 Anxiety disorder, unspecified: Secondary | ICD-10-CM | POA: Diagnosis not present

## 2018-09-19 DIAGNOSIS — E7800 Pure hypercholesterolemia, unspecified: Secondary | ICD-10-CM | POA: Diagnosis not present

## 2018-09-20 DIAGNOSIS — C9001 Multiple myeloma in remission: Secondary | ICD-10-CM | POA: Diagnosis not present

## 2018-10-19 DIAGNOSIS — C9001 Multiple myeloma in remission: Secondary | ICD-10-CM | POA: Diagnosis not present

## 2018-12-05 ENCOUNTER — Other Ambulatory Visit: Payer: Self-pay | Admitting: Obstetrics and Gynecology

## 2018-12-05 DIAGNOSIS — Z1231 Encounter for screening mammogram for malignant neoplasm of breast: Secondary | ICD-10-CM

## 2018-12-07 ENCOUNTER — Other Ambulatory Visit: Payer: Self-pay | Admitting: Nurse Practitioner

## 2018-12-14 DIAGNOSIS — K589 Irritable bowel syndrome without diarrhea: Secondary | ICD-10-CM

## 2018-12-14 HISTORY — DX: Irritable bowel syndrome, unspecified: K58.9

## 2019-01-29 ENCOUNTER — Ambulatory Visit: Payer: Medicare Other

## 2019-03-21 ENCOUNTER — Ambulatory Visit: Payer: Medicare Other | Admitting: Neurology

## 2019-06-22 ENCOUNTER — Other Ambulatory Visit: Payer: Self-pay | Admitting: Family Medicine

## 2019-06-22 ENCOUNTER — Ambulatory Visit
Admission: RE | Admit: 2019-06-22 | Discharge: 2019-06-22 | Disposition: A | Discharge status: Home / Self Care | Payer: Medicare Other | Source: Ambulatory Visit | Attending: Family Medicine | Admitting: Family Medicine

## 2019-06-22 DIAGNOSIS — M542 Cervicalgia: Secondary | ICD-10-CM

## 2019-07-09 ENCOUNTER — Other Ambulatory Visit: Payer: Self-pay | Admitting: Neurology

## 2019-07-09 DIAGNOSIS — G40B09 Juvenile myoclonic epilepsy, not intractable, without status epilepticus: Secondary | ICD-10-CM

## 2019-07-12 ENCOUNTER — Other Ambulatory Visit: Payer: Self-pay | Admitting: Obstetrics and Gynecology

## 2019-07-12 ENCOUNTER — Other Ambulatory Visit: Payer: Self-pay | Admitting: Physician Assistant

## 2019-07-12 DIAGNOSIS — M542 Cervicalgia: Secondary | ICD-10-CM

## 2019-08-09 ENCOUNTER — Telehealth: Payer: Medicare Other | Admitting: Neurology

## 2019-08-09 ENCOUNTER — Other Ambulatory Visit: Payer: Self-pay

## 2019-11-09 ENCOUNTER — Ambulatory Visit
Admission: RE | Admit: 2019-11-09 | Discharge: 2019-11-09 | Disposition: A | Discharge status: Home / Self Care | Payer: Medicare Other | Source: Ambulatory Visit | Attending: Obstetrics and Gynecology | Admitting: Obstetrics and Gynecology

## 2019-11-09 ENCOUNTER — Other Ambulatory Visit: Payer: Self-pay

## 2019-11-09 DIAGNOSIS — Z1231 Encounter for screening mammogram for malignant neoplasm of breast: Secondary | ICD-10-CM

## 2019-11-14 DIAGNOSIS — C9001 Multiple myeloma in remission: Secondary | ICD-10-CM | POA: Diagnosis not present

## 2019-11-14 DIAGNOSIS — C9000 Multiple myeloma not having achieved remission: Secondary | ICD-10-CM | POA: Diagnosis not present

## 2019-11-14 DIAGNOSIS — Z79899 Other long term (current) drug therapy: Secondary | ICD-10-CM | POA: Diagnosis not present

## 2019-11-14 DIAGNOSIS — Z9484 Stem cells transplant status: Secondary | ICD-10-CM | POA: Diagnosis not present

## 2019-11-23 ENCOUNTER — Ambulatory Visit: Payer: Medicare Other | Attending: Internal Medicine

## 2019-11-23 DIAGNOSIS — Z23 Encounter for immunization: Secondary | ICD-10-CM | POA: Insufficient documentation

## 2019-11-23 NOTE — Progress Notes (Signed)
   Covid-19 Vaccination Clinic  Name:  Catherine Bullock    MRN: [id number] DOB: 1946/02/05  11/23/2019  [name] was observed post Covid-19 immunization for 15 minutes without incidence. She was provided with Vaccine Information Sheet and instruction to access the V-Safe system.   [name] was instructed to call 911 with any severe reactions post vaccine: Marland Kitchen Difficulty breathing  . Swelling of your face and throat  . A fast heartbeat  . A bad rash all over your body  . Dizziness and weakness    Immunizations Administered    Name Date Dose VIS Date Route   Pfizer COVID-19 Vaccine 11/23/2019 12:18 PM 0.3 mL 10/12/2019 Intramuscular   Manufacturer: [place]   Lot: [id number]   [place]: [id number]

## 2019-11-28 DIAGNOSIS — C9001 Multiple myeloma in remission: Secondary | ICD-10-CM | POA: Diagnosis not present

## 2019-11-28 DIAGNOSIS — Z5111 Encounter for antineoplastic chemotherapy: Secondary | ICD-10-CM | POA: Diagnosis not present

## 2019-11-28 DIAGNOSIS — Z79899 Other long term (current) drug therapy: Secondary | ICD-10-CM | POA: Diagnosis not present

## 2019-11-28 DIAGNOSIS — Z9484 Stem cells transplant status: Secondary | ICD-10-CM | POA: Diagnosis not present

## 2019-12-06 DIAGNOSIS — E7800 Pure hypercholesterolemia, unspecified: Secondary | ICD-10-CM | POA: Diagnosis not present

## 2019-12-10 DIAGNOSIS — Z5111 Encounter for antineoplastic chemotherapy: Secondary | ICD-10-CM | POA: Diagnosis not present

## 2019-12-10 DIAGNOSIS — Z9484 Stem cells transplant status: Secondary | ICD-10-CM | POA: Diagnosis not present

## 2019-12-10 DIAGNOSIS — Z79899 Other long term (current) drug therapy: Secondary | ICD-10-CM | POA: Diagnosis not present

## 2019-12-10 DIAGNOSIS — C9001 Multiple myeloma in remission: Secondary | ICD-10-CM | POA: Diagnosis not present

## 2019-12-11 ENCOUNTER — Ambulatory Visit: Payer: Medicare Other | Attending: Internal Medicine

## 2019-12-11 DIAGNOSIS — Z23 Encounter for immunization: Secondary | ICD-10-CM | POA: Insufficient documentation

## 2019-12-11 NOTE — Progress Notes (Signed)
   Covid-19 Vaccination Clinic  Name:  Catherine Bullock    MRN: [id number] DOB: July 13, 1946  12/11/2019  [name] was observed post Covid-19 immunization for 15 minutes without incidence. She was provided with Vaccine Information Sheet and instruction to access the V-Safe system.   [name] was instructed to call 911 with any severe reactions post vaccine: Marland Kitchen Difficulty breathing  . Swelling of your face and throat  . A fast heartbeat  . A bad rash all over your body  . Dizziness and weakness    Immunizations Administered    Name Date Dose VIS Date Route   Pfizer COVID-19 Vaccine 12/11/2019  9:07 AM 0.3 mL 10/12/2019 Intramuscular   Manufacturer: [place]   Lot: [id number]   [place]: [id number]

## 2019-12-24 DIAGNOSIS — Z01419 Encounter for gynecological examination (general) (routine) without abnormal findings: Secondary | ICD-10-CM | POA: Diagnosis not present

## 2019-12-24 DIAGNOSIS — F419 Anxiety disorder, unspecified: Secondary | ICD-10-CM | POA: Diagnosis not present

## 2019-12-24 DIAGNOSIS — Z6827 Body mass index (BMI) 27.0-27.9, adult: Secondary | ICD-10-CM | POA: Diagnosis not present

## 2019-12-24 DIAGNOSIS — M816 Localized osteoporosis [Lequesne]: Secondary | ICD-10-CM | POA: Diagnosis not present

## 2019-12-26 DIAGNOSIS — Z9484 Stem cells transplant status: Secondary | ICD-10-CM | POA: Diagnosis not present

## 2019-12-26 DIAGNOSIS — C9001 Multiple myeloma in remission: Secondary | ICD-10-CM | POA: Diagnosis not present

## 2019-12-26 DIAGNOSIS — Z79899 Other long term (current) drug therapy: Secondary | ICD-10-CM | POA: Diagnosis not present

## 2019-12-26 DIAGNOSIS — Z5111 Encounter for antineoplastic chemotherapy: Secondary | ICD-10-CM | POA: Diagnosis not present

## 2020-01-09 DIAGNOSIS — Z87891 Personal history of nicotine dependence: Secondary | ICD-10-CM | POA: Diagnosis not present

## 2020-01-09 DIAGNOSIS — G629 Polyneuropathy, unspecified: Secondary | ICD-10-CM | POA: Diagnosis not present

## 2020-01-09 DIAGNOSIS — G4089 Other seizures: Secondary | ICD-10-CM | POA: Diagnosis not present

## 2020-01-09 DIAGNOSIS — Z5111 Encounter for antineoplastic chemotherapy: Secondary | ICD-10-CM | POA: Diagnosis not present

## 2020-01-09 DIAGNOSIS — D709 Neutropenia, unspecified: Secondary | ICD-10-CM | POA: Diagnosis not present

## 2020-01-09 DIAGNOSIS — Z9484 Stem cells transplant status: Secondary | ICD-10-CM | POA: Diagnosis not present

## 2020-01-09 DIAGNOSIS — G40909 Epilepsy, unspecified, not intractable, without status epilepticus: Secondary | ICD-10-CM | POA: Diagnosis not present

## 2020-01-09 DIAGNOSIS — C9000 Multiple myeloma not having achieved remission: Secondary | ICD-10-CM | POA: Diagnosis not present

## 2020-01-09 DIAGNOSIS — M4800 Spinal stenosis, site unspecified: Secondary | ICD-10-CM | POA: Diagnosis not present

## 2020-01-09 DIAGNOSIS — C9001 Multiple myeloma in remission: Secondary | ICD-10-CM | POA: Diagnosis not present

## 2020-01-09 DIAGNOSIS — I10 Essential (primary) hypertension: Secondary | ICD-10-CM | POA: Diagnosis not present

## 2020-01-09 DIAGNOSIS — M4802 Spinal stenosis, cervical region: Secondary | ICD-10-CM | POA: Diagnosis not present

## 2020-01-09 DIAGNOSIS — Z79899 Other long term (current) drug therapy: Secondary | ICD-10-CM | POA: Diagnosis not present

## 2020-01-14 ENCOUNTER — Other Ambulatory Visit: Payer: Self-pay

## 2020-01-14 DIAGNOSIS — G40B09 Juvenile myoclonic epilepsy, not intractable, without status epilepticus: Secondary | ICD-10-CM

## 2020-01-14 MED ORDER — LEVETIRACETAM 500 MG PO TABS: ORAL_TABLET | ORAL | 1 refills | Status: DC

## 2020-01-15 DIAGNOSIS — L814 Other melanin hyperpigmentation: Secondary | ICD-10-CM | POA: Diagnosis not present

## 2020-01-15 DIAGNOSIS — L821 Other seborrheic keratosis: Secondary | ICD-10-CM | POA: Diagnosis not present

## 2020-01-15 DIAGNOSIS — L570 Actinic keratosis: Secondary | ICD-10-CM | POA: Diagnosis not present

## 2020-01-23 DIAGNOSIS — Z5111 Encounter for antineoplastic chemotherapy: Secondary | ICD-10-CM | POA: Diagnosis not present

## 2020-01-23 DIAGNOSIS — Z9484 Stem cells transplant status: Secondary | ICD-10-CM | POA: Diagnosis not present

## 2020-01-23 DIAGNOSIS — C9001 Multiple myeloma in remission: Secondary | ICD-10-CM | POA: Diagnosis not present

## 2020-01-23 DIAGNOSIS — Z79899 Other long term (current) drug therapy: Secondary | ICD-10-CM | POA: Diagnosis not present

## 2020-02-06 DIAGNOSIS — M25532 Pain in left wrist: Secondary | ICD-10-CM | POA: Diagnosis not present

## 2020-02-06 DIAGNOSIS — C9000 Multiple myeloma not having achieved remission: Secondary | ICD-10-CM | POA: Diagnosis not present

## 2020-02-06 DIAGNOSIS — Z5111 Encounter for antineoplastic chemotherapy: Secondary | ICD-10-CM | POA: Diagnosis not present

## 2020-02-08 DIAGNOSIS — M25532 Pain in left wrist: Secondary | ICD-10-CM | POA: Diagnosis not present

## 2020-02-08 DIAGNOSIS — S52572A Other intraarticular fracture of lower end of left radius, initial encounter for closed fracture: Secondary | ICD-10-CM | POA: Diagnosis not present

## 2020-02-12 DIAGNOSIS — E7800 Pure hypercholesterolemia, unspecified: Secondary | ICD-10-CM | POA: Diagnosis not present

## 2020-02-12 DIAGNOSIS — G40909 Epilepsy, unspecified, not intractable, without status epilepticus: Secondary | ICD-10-CM | POA: Diagnosis not present

## 2020-02-12 DIAGNOSIS — F419 Anxiety disorder, unspecified: Secondary | ICD-10-CM | POA: Diagnosis not present

## 2020-02-12 DIAGNOSIS — C9001 Multiple myeloma in remission: Secondary | ICD-10-CM | POA: Diagnosis not present

## 2020-02-13 DIAGNOSIS — S52562D Barton's fracture of left radius, subsequent encounter for closed fracture with routine healing: Secondary | ICD-10-CM | POA: Insufficient documentation

## 2020-02-13 DIAGNOSIS — R29898 Other symptoms and signs involving the musculoskeletal system: Secondary | ICD-10-CM | POA: Diagnosis not present

## 2020-02-13 DIAGNOSIS — M25462 Effusion, left knee: Secondary | ICD-10-CM | POA: Diagnosis not present

## 2020-02-13 DIAGNOSIS — S52572A Other intraarticular fracture of lower end of left radius, initial encounter for closed fracture: Secondary | ICD-10-CM | POA: Diagnosis not present

## 2020-02-13 DIAGNOSIS — M25562 Pain in left knee: Secondary | ICD-10-CM | POA: Diagnosis not present

## 2020-02-13 HISTORY — DX: Barton's fracture of left radius, subsequent encounter for closed fracture with routine healing: S52.562D

## 2020-02-20 DIAGNOSIS — Z5111 Encounter for antineoplastic chemotherapy: Secondary | ICD-10-CM | POA: Diagnosis not present

## 2020-02-20 DIAGNOSIS — C9000 Multiple myeloma not having achieved remission: Secondary | ICD-10-CM | POA: Diagnosis not present

## 2020-03-05 DIAGNOSIS — Z5111 Encounter for antineoplastic chemotherapy: Secondary | ICD-10-CM | POA: Diagnosis not present

## 2020-03-05 DIAGNOSIS — C9000 Multiple myeloma not having achieved remission: Secondary | ICD-10-CM | POA: Diagnosis not present

## 2020-03-21 DIAGNOSIS — Z5111 Encounter for antineoplastic chemotherapy: Secondary | ICD-10-CM | POA: Diagnosis not present

## 2020-03-21 DIAGNOSIS — C9000 Multiple myeloma not having achieved remission: Secondary | ICD-10-CM | POA: Diagnosis not present

## 2020-04-02 DIAGNOSIS — C9000 Multiple myeloma not having achieved remission: Secondary | ICD-10-CM | POA: Diagnosis not present

## 2020-04-02 DIAGNOSIS — Z5111 Encounter for antineoplastic chemotherapy: Secondary | ICD-10-CM | POA: Diagnosis not present

## 2020-04-16 DIAGNOSIS — C9000 Multiple myeloma not having achieved remission: Secondary | ICD-10-CM | POA: Diagnosis not present

## 2020-04-16 DIAGNOSIS — Z5111 Encounter for antineoplastic chemotherapy: Secondary | ICD-10-CM | POA: Diagnosis not present

## 2020-04-22 DIAGNOSIS — D473 Essential (hemorrhagic) thrombocythemia: Secondary | ICD-10-CM | POA: Diagnosis not present

## 2020-04-22 DIAGNOSIS — C9000 Multiple myeloma not having achieved remission: Secondary | ICD-10-CM | POA: Diagnosis not present

## 2020-05-02 DIAGNOSIS — C9000 Multiple myeloma not having achieved remission: Secondary | ICD-10-CM | POA: Diagnosis not present

## 2020-05-02 DIAGNOSIS — F419 Anxiety disorder, unspecified: Secondary | ICD-10-CM | POA: Diagnosis not present

## 2020-05-02 DIAGNOSIS — R52 Pain, unspecified: Secondary | ICD-10-CM | POA: Diagnosis not present

## 2020-05-20 DIAGNOSIS — C9000 Multiple myeloma not having achieved remission: Secondary | ICD-10-CM | POA: Diagnosis not present

## 2020-05-20 DIAGNOSIS — D473 Essential (hemorrhagic) thrombocythemia: Secondary | ICD-10-CM | POA: Diagnosis not present

## 2020-05-20 DIAGNOSIS — C9002 Multiple myeloma in relapse: Secondary | ICD-10-CM | POA: Diagnosis not present

## 2020-05-20 DIAGNOSIS — Z79899 Other long term (current) drug therapy: Secondary | ICD-10-CM | POA: Diagnosis not present

## 2020-05-20 DIAGNOSIS — Z87891 Personal history of nicotine dependence: Secondary | ICD-10-CM | POA: Diagnosis not present

## 2020-05-20 DIAGNOSIS — D709 Neutropenia, unspecified: Secondary | ICD-10-CM | POA: Diagnosis not present

## 2020-06-02 DIAGNOSIS — R569 Unspecified convulsions: Secondary | ICD-10-CM | POA: Diagnosis not present

## 2020-06-02 DIAGNOSIS — C946 Myelodysplastic disease, not classified: Secondary | ICD-10-CM | POA: Diagnosis not present

## 2020-06-02 DIAGNOSIS — Z79899 Other long term (current) drug therapy: Secondary | ICD-10-CM | POA: Diagnosis not present

## 2020-06-02 DIAGNOSIS — Z9484 Stem cells transplant status: Secondary | ICD-10-CM | POA: Diagnosis not present

## 2020-06-02 DIAGNOSIS — Z7952 Long term (current) use of systemic steroids: Secondary | ICD-10-CM | POA: Diagnosis not present

## 2020-06-02 DIAGNOSIS — C9002 Multiple myeloma in relapse: Secondary | ICD-10-CM | POA: Diagnosis not present

## 2020-06-02 DIAGNOSIS — M810 Age-related osteoporosis without current pathological fracture: Secondary | ICD-10-CM | POA: Diagnosis not present

## 2020-06-02 DIAGNOSIS — Z5111 Encounter for antineoplastic chemotherapy: Secondary | ICD-10-CM | POA: Diagnosis not present

## 2020-06-02 DIAGNOSIS — D709 Neutropenia, unspecified: Secondary | ICD-10-CM | POA: Diagnosis not present

## 2020-06-02 DIAGNOSIS — D473 Essential (hemorrhagic) thrombocythemia: Secondary | ICD-10-CM | POA: Diagnosis not present

## 2020-06-02 DIAGNOSIS — Z7982 Long term (current) use of aspirin: Secondary | ICD-10-CM | POA: Diagnosis not present

## 2020-06-09 DIAGNOSIS — Z5111 Encounter for antineoplastic chemotherapy: Secondary | ICD-10-CM | POA: Diagnosis not present

## 2020-06-09 DIAGNOSIS — C9002 Multiple myeloma in relapse: Secondary | ICD-10-CM | POA: Diagnosis not present

## 2020-06-16 DIAGNOSIS — C9202 Acute myeloblastic leukemia, in relapse: Secondary | ICD-10-CM | POA: Diagnosis not present

## 2020-06-16 DIAGNOSIS — C9002 Multiple myeloma in relapse: Secondary | ICD-10-CM | POA: Diagnosis not present

## 2020-06-23 ENCOUNTER — Other Ambulatory Visit: Payer: Self-pay | Admitting: Neurology

## 2020-06-23 DIAGNOSIS — G40B09 Juvenile myoclonic epilepsy, not intractable, without status epilepticus: Secondary | ICD-10-CM

## 2020-06-23 DIAGNOSIS — C9002 Multiple myeloma in relapse: Secondary | ICD-10-CM | POA: Diagnosis not present

## 2020-06-23 DIAGNOSIS — Z5111 Encounter for antineoplastic chemotherapy: Secondary | ICD-10-CM | POA: Diagnosis not present

## 2020-06-23 NOTE — Telephone Encounter (Signed)
Pls call patient to schedule appt, I have not seen her in 2 years, she needs to make appt so we can send more refills. Pls let me know date of appt and I will send refills until then. Or if she wishes to ask her PCP for refills, that is also an option. Thanks

## 2020-06-24 DIAGNOSIS — R5383 Other fatigue: Secondary | ICD-10-CM | POA: Diagnosis not present

## 2020-06-24 DIAGNOSIS — Z03818 Encounter for observation for suspected exposure to other biological agents ruled out: Secondary | ICD-10-CM | POA: Diagnosis not present

## 2020-06-24 NOTE — Telephone Encounter (Signed)
Called patient and left a message to call back and schedule an appointment with Dr. Delice Lesch.

## 2020-06-25 NOTE — Telephone Encounter (Signed)
Patient scheduled on 02/06/21 at 10:00 AM with Dr. Delice Lesch. She is requesting refills to last until then, if possible.

## 2020-06-30 DIAGNOSIS — Z20822 Contact with and (suspected) exposure to covid-19: Secondary | ICD-10-CM | POA: Diagnosis not present

## 2020-07-08 DIAGNOSIS — R569 Unspecified convulsions: Secondary | ICD-10-CM | POA: Diagnosis not present

## 2020-07-08 DIAGNOSIS — D708 Other neutropenia: Secondary | ICD-10-CM | POA: Diagnosis not present

## 2020-07-08 DIAGNOSIS — C9000 Multiple myeloma not having achieved remission: Secondary | ICD-10-CM | POA: Diagnosis not present

## 2020-07-08 DIAGNOSIS — Z9484 Stem cells transplant status: Secondary | ICD-10-CM | POA: Diagnosis not present

## 2020-07-08 DIAGNOSIS — Z7982 Long term (current) use of aspirin: Secondary | ICD-10-CM | POA: Diagnosis not present

## 2020-07-08 DIAGNOSIS — M810 Age-related osteoporosis without current pathological fracture: Secondary | ICD-10-CM | POA: Diagnosis not present

## 2020-07-08 DIAGNOSIS — C9002 Multiple myeloma in relapse: Secondary | ICD-10-CM | POA: Diagnosis not present

## 2020-07-08 DIAGNOSIS — Z7952 Long term (current) use of systemic steroids: Secondary | ICD-10-CM | POA: Diagnosis not present

## 2020-07-08 DIAGNOSIS — Z79899 Other long term (current) drug therapy: Secondary | ICD-10-CM | POA: Diagnosis not present

## 2020-07-08 DIAGNOSIS — D473 Essential (hemorrhagic) thrombocythemia: Secondary | ICD-10-CM | POA: Diagnosis not present

## 2020-07-14 DIAGNOSIS — Z79899 Other long term (current) drug therapy: Secondary | ICD-10-CM | POA: Diagnosis not present

## 2020-07-14 DIAGNOSIS — Z5111 Encounter for antineoplastic chemotherapy: Secondary | ICD-10-CM | POA: Diagnosis not present

## 2020-07-14 DIAGNOSIS — C9002 Multiple myeloma in relapse: Secondary | ICD-10-CM | POA: Diagnosis not present

## 2020-07-21 DIAGNOSIS — C9002 Multiple myeloma in relapse: Secondary | ICD-10-CM | POA: Diagnosis not present

## 2020-07-21 DIAGNOSIS — Z79899 Other long term (current) drug therapy: Secondary | ICD-10-CM | POA: Diagnosis not present

## 2020-07-21 DIAGNOSIS — Z5111 Encounter for antineoplastic chemotherapy: Secondary | ICD-10-CM | POA: Diagnosis not present

## 2020-07-28 DIAGNOSIS — C9002 Multiple myeloma in relapse: Secondary | ICD-10-CM | POA: Diagnosis not present

## 2020-07-28 DIAGNOSIS — Z79899 Other long term (current) drug therapy: Secondary | ICD-10-CM | POA: Diagnosis not present

## 2020-07-28 DIAGNOSIS — Z5111 Encounter for antineoplastic chemotherapy: Secondary | ICD-10-CM | POA: Diagnosis not present

## 2020-08-04 DIAGNOSIS — R569 Unspecified convulsions: Secondary | ICD-10-CM | POA: Diagnosis not present

## 2020-08-04 DIAGNOSIS — G40B09 Juvenile myoclonic epilepsy, not intractable, without status epilepticus: Secondary | ICD-10-CM | POA: Diagnosis not present

## 2020-08-04 DIAGNOSIS — D473 Essential (hemorrhagic) thrombocythemia: Secondary | ICD-10-CM | POA: Diagnosis not present

## 2020-08-04 DIAGNOSIS — M810 Age-related osteoporosis without current pathological fracture: Secondary | ICD-10-CM | POA: Diagnosis not present

## 2020-08-04 DIAGNOSIS — Z7984 Long term (current) use of oral hypoglycemic drugs: Secondary | ICD-10-CM | POA: Diagnosis not present

## 2020-08-04 DIAGNOSIS — Z8669 Personal history of other diseases of the nervous system and sense organs: Secondary | ICD-10-CM | POA: Diagnosis not present

## 2020-08-04 DIAGNOSIS — Z7982 Long term (current) use of aspirin: Secondary | ICD-10-CM | POA: Diagnosis not present

## 2020-08-04 DIAGNOSIS — C9002 Multiple myeloma in relapse: Secondary | ICD-10-CM | POA: Diagnosis not present

## 2020-08-04 DIAGNOSIS — Z5111 Encounter for antineoplastic chemotherapy: Secondary | ICD-10-CM | POA: Diagnosis not present

## 2020-08-04 DIAGNOSIS — Z23 Encounter for immunization: Secondary | ICD-10-CM | POA: Diagnosis not present

## 2020-08-04 DIAGNOSIS — D708 Other neutropenia: Secondary | ICD-10-CM | POA: Diagnosis not present

## 2020-08-04 DIAGNOSIS — Z9484 Stem cells transplant status: Secondary | ICD-10-CM | POA: Diagnosis not present

## 2020-08-04 DIAGNOSIS — Z79899 Other long term (current) drug therapy: Secondary | ICD-10-CM | POA: Diagnosis not present

## 2020-08-04 DIAGNOSIS — Z7952 Long term (current) use of systemic steroids: Secondary | ICD-10-CM | POA: Diagnosis not present

## 2020-08-11 DIAGNOSIS — C9002 Multiple myeloma in relapse: Secondary | ICD-10-CM | POA: Diagnosis not present

## 2020-08-18 DIAGNOSIS — Z79899 Other long term (current) drug therapy: Secondary | ICD-10-CM | POA: Diagnosis not present

## 2020-08-18 DIAGNOSIS — C9002 Multiple myeloma in relapse: Secondary | ICD-10-CM | POA: Diagnosis not present

## 2020-08-18 DIAGNOSIS — Z5111 Encounter for antineoplastic chemotherapy: Secondary | ICD-10-CM | POA: Diagnosis not present

## 2020-08-25 DIAGNOSIS — Z23 Encounter for immunization: Secondary | ICD-10-CM | POA: Diagnosis not present

## 2020-08-25 DIAGNOSIS — C9002 Multiple myeloma in relapse: Secondary | ICD-10-CM | POA: Diagnosis not present

## 2020-09-02 DIAGNOSIS — C9000 Multiple myeloma not having achieved remission: Secondary | ICD-10-CM | POA: Diagnosis not present

## 2020-09-02 DIAGNOSIS — R569 Unspecified convulsions: Secondary | ICD-10-CM | POA: Diagnosis not present

## 2020-09-02 DIAGNOSIS — Z8669 Personal history of other diseases of the nervous system and sense organs: Secondary | ICD-10-CM | POA: Diagnosis not present

## 2020-09-02 DIAGNOSIS — Z7984 Long term (current) use of oral hypoglycemic drugs: Secondary | ICD-10-CM | POA: Diagnosis not present

## 2020-09-02 DIAGNOSIS — Z7952 Long term (current) use of systemic steroids: Secondary | ICD-10-CM | POA: Diagnosis not present

## 2020-09-02 DIAGNOSIS — Z7982 Long term (current) use of aspirin: Secondary | ICD-10-CM | POA: Diagnosis not present

## 2020-09-02 DIAGNOSIS — C9002 Multiple myeloma in relapse: Secondary | ICD-10-CM | POA: Diagnosis not present

## 2020-09-02 DIAGNOSIS — Z79899 Other long term (current) drug therapy: Secondary | ICD-10-CM | POA: Diagnosis not present

## 2020-09-02 DIAGNOSIS — Z7983 Long term (current) use of bisphosphonates: Secondary | ICD-10-CM | POA: Diagnosis not present

## 2020-09-02 DIAGNOSIS — M810 Age-related osteoporosis without current pathological fracture: Secondary | ICD-10-CM | POA: Diagnosis not present

## 2020-09-02 DIAGNOSIS — D473 Essential (hemorrhagic) thrombocythemia: Secondary | ICD-10-CM | POA: Diagnosis not present

## 2020-09-02 DIAGNOSIS — D708 Other neutropenia: Secondary | ICD-10-CM | POA: Diagnosis not present

## 2020-09-02 DIAGNOSIS — Z9484 Stem cells transplant status: Secondary | ICD-10-CM | POA: Diagnosis not present

## 2020-09-08 DIAGNOSIS — M654 Radial styloid tenosynovitis [de Quervain]: Secondary | ICD-10-CM | POA: Diagnosis not present

## 2020-09-15 DIAGNOSIS — C9002 Multiple myeloma in relapse: Secondary | ICD-10-CM | POA: Diagnosis not present

## 2020-09-15 DIAGNOSIS — Z5111 Encounter for antineoplastic chemotherapy: Secondary | ICD-10-CM | POA: Diagnosis not present

## 2020-09-15 DIAGNOSIS — Z79899 Other long term (current) drug therapy: Secondary | ICD-10-CM | POA: Diagnosis not present

## 2020-09-29 DIAGNOSIS — R569 Unspecified convulsions: Secondary | ICD-10-CM | POA: Diagnosis not present

## 2020-09-29 DIAGNOSIS — D709 Neutropenia, unspecified: Secondary | ICD-10-CM | POA: Diagnosis not present

## 2020-09-29 DIAGNOSIS — Z9484 Stem cells transplant status: Secondary | ICD-10-CM | POA: Diagnosis not present

## 2020-09-29 DIAGNOSIS — Z7983 Long term (current) use of bisphosphonates: Secondary | ICD-10-CM | POA: Diagnosis not present

## 2020-09-29 DIAGNOSIS — Z7952 Long term (current) use of systemic steroids: Secondary | ICD-10-CM | POA: Diagnosis not present

## 2020-09-29 DIAGNOSIS — Z79899 Other long term (current) drug therapy: Secondary | ICD-10-CM | POA: Diagnosis not present

## 2020-09-29 DIAGNOSIS — D473 Essential (hemorrhagic) thrombocythemia: Secondary | ICD-10-CM | POA: Diagnosis not present

## 2020-09-29 DIAGNOSIS — Z8669 Personal history of other diseases of the nervous system and sense organs: Secondary | ICD-10-CM | POA: Diagnosis not present

## 2020-09-29 DIAGNOSIS — D708 Other neutropenia: Secondary | ICD-10-CM | POA: Diagnosis not present

## 2020-09-29 DIAGNOSIS — Z5111 Encounter for antineoplastic chemotherapy: Secondary | ICD-10-CM | POA: Diagnosis not present

## 2020-09-29 DIAGNOSIS — M810 Age-related osteoporosis without current pathological fracture: Secondary | ICD-10-CM | POA: Diagnosis not present

## 2020-09-29 DIAGNOSIS — G40B09 Juvenile myoclonic epilepsy, not intractable, without status epilepticus: Secondary | ICD-10-CM | POA: Diagnosis not present

## 2020-09-29 DIAGNOSIS — Z7984 Long term (current) use of oral hypoglycemic drugs: Secondary | ICD-10-CM | POA: Diagnosis not present

## 2020-09-29 DIAGNOSIS — Z7982 Long term (current) use of aspirin: Secondary | ICD-10-CM | POA: Diagnosis not present

## 2020-09-29 DIAGNOSIS — C9002 Multiple myeloma in relapse: Secondary | ICD-10-CM | POA: Diagnosis not present

## 2020-10-14 DIAGNOSIS — C9000 Multiple myeloma not having achieved remission: Secondary | ICD-10-CM | POA: Diagnosis not present

## 2020-10-14 DIAGNOSIS — Z5111 Encounter for antineoplastic chemotherapy: Secondary | ICD-10-CM | POA: Diagnosis not present

## 2020-10-14 DIAGNOSIS — Z79899 Other long term (current) drug therapy: Secondary | ICD-10-CM | POA: Diagnosis not present

## 2020-11-03 DIAGNOSIS — S62011A Displaced fracture of distal pole of navicular [scaphoid] bone of right wrist, initial encounter for closed fracture: Secondary | ICD-10-CM | POA: Diagnosis not present

## 2020-11-03 DIAGNOSIS — M810 Age-related osteoporosis without current pathological fracture: Secondary | ICD-10-CM | POA: Diagnosis not present

## 2020-11-03 DIAGNOSIS — D473 Essential (hemorrhagic) thrombocythemia: Secondary | ICD-10-CM | POA: Diagnosis not present

## 2020-11-03 DIAGNOSIS — Z7983 Long term (current) use of bisphosphonates: Secondary | ICD-10-CM | POA: Diagnosis not present

## 2020-11-03 DIAGNOSIS — I10 Essential (primary) hypertension: Secondary | ICD-10-CM | POA: Diagnosis not present

## 2020-11-03 DIAGNOSIS — M25512 Pain in left shoulder: Secondary | ICD-10-CM | POA: Diagnosis not present

## 2020-11-03 DIAGNOSIS — Z8669 Personal history of other diseases of the nervous system and sense organs: Secondary | ICD-10-CM | POA: Diagnosis not present

## 2020-11-03 DIAGNOSIS — R569 Unspecified convulsions: Secondary | ICD-10-CM | POA: Diagnosis not present

## 2020-11-03 DIAGNOSIS — Z7982 Long term (current) use of aspirin: Secondary | ICD-10-CM | POA: Diagnosis not present

## 2020-11-03 DIAGNOSIS — X58XXXA Exposure to other specified factors, initial encounter: Secondary | ICD-10-CM | POA: Diagnosis not present

## 2020-11-03 DIAGNOSIS — Z79899 Other long term (current) drug therapy: Secondary | ICD-10-CM | POA: Diagnosis not present

## 2020-11-03 DIAGNOSIS — M654 Radial styloid tenosynovitis [de Quervain]: Secondary | ICD-10-CM | POA: Diagnosis not present

## 2020-11-03 DIAGNOSIS — Z9484 Stem cells transplant status: Secondary | ICD-10-CM | POA: Diagnosis not present

## 2020-11-03 DIAGNOSIS — Z7984 Long term (current) use of oral hypoglycemic drugs: Secondary | ICD-10-CM | POA: Diagnosis not present

## 2020-11-03 DIAGNOSIS — M542 Cervicalgia: Secondary | ICD-10-CM | POA: Diagnosis not present

## 2020-11-03 DIAGNOSIS — G40B09 Juvenile myoclonic epilepsy, not intractable, without status epilepticus: Secondary | ICD-10-CM | POA: Diagnosis not present

## 2020-11-03 DIAGNOSIS — C9000 Multiple myeloma not having achieved remission: Secondary | ICD-10-CM | POA: Diagnosis not present

## 2020-11-03 DIAGNOSIS — D708 Other neutropenia: Secondary | ICD-10-CM | POA: Diagnosis not present

## 2020-11-03 DIAGNOSIS — Z7952 Long term (current) use of systemic steroids: Secondary | ICD-10-CM | POA: Diagnosis not present

## 2020-11-19 DIAGNOSIS — S42215A Unspecified nondisplaced fracture of surgical neck of left humerus, initial encounter for closed fracture: Secondary | ICD-10-CM | POA: Diagnosis not present

## 2020-11-19 DIAGNOSIS — Z5111 Encounter for antineoplastic chemotherapy: Secondary | ICD-10-CM | POA: Diagnosis not present

## 2020-11-19 DIAGNOSIS — Z79899 Other long term (current) drug therapy: Secondary | ICD-10-CM | POA: Diagnosis not present

## 2020-11-19 DIAGNOSIS — M25512 Pain in left shoulder: Secondary | ICD-10-CM | POA: Diagnosis not present

## 2020-11-19 DIAGNOSIS — C9000 Multiple myeloma not having achieved remission: Secondary | ICD-10-CM | POA: Diagnosis not present

## 2020-11-19 DIAGNOSIS — M542 Cervicalgia: Secondary | ICD-10-CM | POA: Diagnosis not present

## 2020-12-01 DIAGNOSIS — Z5111 Encounter for antineoplastic chemotherapy: Secondary | ICD-10-CM | POA: Diagnosis not present

## 2020-12-01 DIAGNOSIS — C9000 Multiple myeloma not having achieved remission: Secondary | ICD-10-CM | POA: Diagnosis not present

## 2020-12-01 DIAGNOSIS — Z79899 Other long term (current) drug therapy: Secondary | ICD-10-CM | POA: Diagnosis not present

## 2020-12-10 DIAGNOSIS — M19012 Primary osteoarthritis, left shoulder: Secondary | ICD-10-CM | POA: Diagnosis not present

## 2020-12-10 DIAGNOSIS — S42292D Other displaced fracture of upper end of left humerus, subsequent encounter for fracture with routine healing: Secondary | ICD-10-CM | POA: Diagnosis not present

## 2020-12-10 DIAGNOSIS — S52562D Barton's fracture of left radius, subsequent encounter for closed fracture with routine healing: Secondary | ICD-10-CM | POA: Diagnosis not present

## 2020-12-10 DIAGNOSIS — M11212 Other chondrocalcinosis, left shoulder: Secondary | ICD-10-CM | POA: Diagnosis not present

## 2020-12-10 DIAGNOSIS — M654 Radial styloid tenosynovitis [de Quervain]: Secondary | ICD-10-CM | POA: Diagnosis not present

## 2020-12-10 DIAGNOSIS — S42215D Unspecified nondisplaced fracture of surgical neck of left humerus, subsequent encounter for fracture with routine healing: Secondary | ICD-10-CM | POA: Diagnosis not present

## 2020-12-10 HISTORY — DX: Radial styloid tenosynovitis (de quervain): M65.4

## 2020-12-10 HISTORY — DX: Other displaced fracture of upper end of left humerus, subsequent encounter for fracture with routine healing: S42.292D

## 2020-12-16 DIAGNOSIS — S42255D Nondisplaced fracture of greater tuberosity of left humerus, subsequent encounter for fracture with routine healing: Secondary | ICD-10-CM | POA: Diagnosis not present

## 2020-12-23 ENCOUNTER — Other Ambulatory Visit: Payer: Self-pay | Admitting: Obstetrics and Gynecology

## 2020-12-23 DIAGNOSIS — Z1231 Encounter for screening mammogram for malignant neoplasm of breast: Secondary | ICD-10-CM

## 2020-12-24 DIAGNOSIS — F419 Anxiety disorder, unspecified: Secondary | ICD-10-CM | POA: Diagnosis not present

## 2020-12-24 DIAGNOSIS — Z6821 Body mass index (BMI) 21.0-21.9, adult: Secondary | ICD-10-CM | POA: Diagnosis not present

## 2020-12-24 DIAGNOSIS — Z01419 Encounter for gynecological examination (general) (routine) without abnormal findings: Secondary | ICD-10-CM | POA: Diagnosis not present

## 2020-12-24 DIAGNOSIS — Z124 Encounter for screening for malignant neoplasm of cervix: Secondary | ICD-10-CM | POA: Diagnosis not present

## 2020-12-30 DIAGNOSIS — Z9484 Stem cells transplant status: Secondary | ICD-10-CM | POA: Diagnosis not present

## 2020-12-30 DIAGNOSIS — R569 Unspecified convulsions: Secondary | ICD-10-CM | POA: Diagnosis not present

## 2020-12-30 DIAGNOSIS — C9000 Multiple myeloma not having achieved remission: Secondary | ICD-10-CM | POA: Diagnosis not present

## 2020-12-30 DIAGNOSIS — Z7982 Long term (current) use of aspirin: Secondary | ICD-10-CM | POA: Diagnosis not present

## 2020-12-30 DIAGNOSIS — S42292D Other displaced fracture of upper end of left humerus, subsequent encounter for fracture with routine healing: Secondary | ICD-10-CM | POA: Diagnosis not present

## 2020-12-30 DIAGNOSIS — Z79899 Other long term (current) drug therapy: Secondary | ICD-10-CM | POA: Diagnosis not present

## 2020-12-30 DIAGNOSIS — M810 Age-related osteoporosis without current pathological fracture: Secondary | ICD-10-CM | POA: Diagnosis not present

## 2020-12-30 DIAGNOSIS — D708 Other neutropenia: Secondary | ICD-10-CM | POA: Diagnosis not present

## 2020-12-30 DIAGNOSIS — D473 Essential (hemorrhagic) thrombocythemia: Secondary | ICD-10-CM | POA: Diagnosis not present

## 2020-12-30 DIAGNOSIS — Z7983 Long term (current) use of bisphosphonates: Secondary | ICD-10-CM | POA: Diagnosis not present

## 2020-12-30 DIAGNOSIS — Z7952 Long term (current) use of systemic steroids: Secondary | ICD-10-CM | POA: Diagnosis not present

## 2020-12-30 DIAGNOSIS — Z8669 Personal history of other diseases of the nervous system and sense organs: Secondary | ICD-10-CM | POA: Diagnosis not present

## 2020-12-30 DIAGNOSIS — Z7984 Long term (current) use of oral hypoglycemic drugs: Secondary | ICD-10-CM | POA: Diagnosis not present

## 2020-12-30 DIAGNOSIS — M25512 Pain in left shoulder: Secondary | ICD-10-CM | POA: Diagnosis not present

## 2021-01-09 ENCOUNTER — Ambulatory Visit (INDEPENDENT_AMBULATORY_CARE_PROVIDER_SITE_OTHER): Payer: Medicare Other | Admitting: Neurology

## 2021-01-09 ENCOUNTER — Encounter: Payer: Self-pay | Admitting: Neurology

## 2021-01-09 ENCOUNTER — Other Ambulatory Visit: Payer: Self-pay

## 2021-01-09 VITALS — BP 163/79 | HR 78 | Resp 20 | Ht 63.0 in | Wt 121.0 lb

## 2021-01-09 DIAGNOSIS — G40B09 Juvenile myoclonic epilepsy, not intractable, without status epilepticus: Secondary | ICD-10-CM

## 2021-01-09 MED ORDER — LEVETIRACETAM 500 MG PO TABS: 1000.0000 mg | ORAL_TABLET | Freq: Two times a day (BID) | ORAL | 3 refills | Status: DC

## 2021-01-09 NOTE — Progress Notes (Signed)
NEUROLOGY FOLLOW UP OFFICE NOTE  Catherine Bullock [id number] 29-May-1946  HISTORY OF PRESENT ILLNESS: I had the pleasure of seeing Catherine Bullock in follow-up in the neurology clinic on 01/09/2021.  The patient was last seen in September 2019 for seizures. She has a diagnosis of juvenile myoclonic epilepsy since age 75, seizures usually start with myoclonic jerks that progres to a convulsions. She has not had similar seizures since 1987 until she had 2 seizures in April 2018, then in July 2018. The first seizure occurred during medication change, the second occurred after her first dose of Melphalan a few hours prior. She has been on Levetiracetam 1059m BID with no further seizures or seizure-like symptoms. She denies any staring/unresponsive episodes, gaps in time, olfactory/gustatory hallucinations, focal numbness/tingling/weakness, myoclonic jerks. No headaches, dizziness, vision changes, no falls. Sleep and mood are good. She recently had a bone marrow biopsy and received the call that there is no evidence of MM and bloodwork is good.    History on Initial Assessment 01/12/2017: This is a pleasant 75yo RH with a history of multiple myeloma, hypertension, juvenile myoclonic epilepsy. She had her first seizure in the 9th grade, at that time attributed to sleep deprivation and stress. She had her second seizure in 1972, and had several more until 1987. She started Dilantin in 1976 and would have breakthrough seizures due to medication compliance. She reports seizures always started with myoclonic jerks that would proceed to a convulsion. She denies any myoclonic jerks or convulsions since 1987, she has been very compliant with Dilantin 2051min AM, 10064mn PM with no side effects. She and her husband deny any staring episodes. She recalls one time in 1987 when she missed doses of Dilantin, she was having some cognitive changes, then came to driving to her parents' house with no recollection of events. This  has not recurred since then. She denies any olfactory/gustatory hallucinations, deja vu, rising epigastric sensation, focal numbness/tingling/weakness. She has been undergoing treatment for multiple myeloma, and was noted to have pancytopenia due to interaction of lenalidomide with her Dilantin. Per records, levels have started to recover. She was switched to KepMingovillest week, instructed to stop Dilantin and start Keppra. She has been off Dilantin for a week and taking Keppra 500m46mD with no side effects. She denies any myoclonic jerks or convulsions. She has minimized driving. She denies any headaches, dizziness, diplopia, dysarthria/dysphagia, neck/back pain, bowel/bladder dysfunction. No falls.  Epilepsy Risk Factors:  Her son started had 2 seizures as a teenager and is also taking Keppra. Otherwise she had a normal birth and early development.  There is no history of febrile convulsions, CNS infections such as meningitis/encephalitis, significant traumatic brain injury, neurosurgical procedures.  Diagnostic Data: MRI brain with and without contrast 02/24/17 which did not show any acute changes, hippocampi symmetric with no abnormal signal or enhancement seen.   PAST MEDICAL HISTORY: Past Medical History:  Diagnosis Date  . Anxiety   . Colon adenoma   . Degenerative disk disease   . Epilepsy (HCC)[place] last seizure 1987  . GERD (gastroesophageal reflux disease)   . Hypertension   . IgG monoclonal gammopathy   . Iron deficiency anemia   . Multiple myeloma (HCC)Early8/2018  . MVP (mitral valve prolapse)   . Osteoporosis   . Thrombocytosis     MEDICATIONS: Current Outpatient Medications on File Prior to Visit  Medication Sig Dispense Refill  . ALPRAZolam (XANAX) 0.25 MG tablet Take 1 tablet (  0.25 mg total) by mouth every 8 (eight) hours as needed. 30 tablet 1  . aspirin EC 81 MG tablet Take 81 mg by mouth daily.    Marland Kitchen escitalopram (LEXAPRO) 20 MG tablet Take 20 mg by mouth daily.    Marland Kitchen  levETIRAcetam (KEPPRA) 500 MG tablet TAKE 2 TABLETS BY MOUTH TWICE DAILY 360 tablet 2  . Multiple Vitamin (MULTIVITAMIN) tablet Take 1 tablet by mouth daily.    Marland Kitchen REVLIMID 5 MG capsule   0   No current facility-administered medications on file prior to visit.    ALLERGIES: Allergies  Allergen Reactions  . Thyroid Hormones Other (See Comments)    FAMILY HISTORY: Family History  Problem Relation Age of Onset  . Nephrolithiasis Mother   . Alzheimer's disease Mother   . Ulcers Mother   . Prostate cancer Father   . Multiple myeloma Father   . Colon cancer Neg Hx     SOCIAL HISTORY: Social History   Socioeconomic History  . Marital status: Married    Spouse name: Not on file  . Number of children: Not on file  . Years of education: Not on file  . Highest education level: Not on file  Occupational History  . Not on file  Tobacco Use  . Smoking status: Former Smoker    Quit date: 06/21/1979    Years since quitting: 41.5  . Smokeless tobacco: Never Used  Vaping Use  . Vaping Use: Never used  Substance and Sexual Activity  . Alcohol use: Yes    Alcohol/week: 2.0 standard drinks    Types: 2 Glasses of wine per week  . Drug use: No  . Sexual activity: Yes  Other Topics Concern  . Not on file  Social History Narrative  . Not on file   Social Determinants of Health   Financial Resource Strain: Not on file  Food Insecurity: Not on file  Transportation Needs: Not on file  Physical Activity: Not on file  Stress: Not on file  Social Connections: Not on file  Intimate Partner Violence: Not on file     PHYSICAL EXAM: Vitals:   01/09/21 1130  BP: (!) 163/79  Pulse: 78  Resp: 20  SpO2: 99%   General: No acute distress Head:  Normocephalic/atraumatic Skin/Extremities: No rash, no edema Neurological Exam: alert and awake. No aphasia or dysarthria. Fund of knowledge is appropriate.  Attention and concentration are normal.   Cranial nerves: Pupils equal, round.  Extraocular movements intact with no nystagmus. Visual fields full.  No facial asymmetry.  Motor: Bulk and tone normal, muscle strength 5/5 throughout with no pronator drift.   Finger to nose testing intact.  Gait narrow-based and steady, difficulty with tandem walk. Romberg negative.   IMPRESSION: This is a pleasant 75 yo RH woman with a history of juvenile myoclonic epilepsy, who had been seizure-free since 1987 on chronic Dilantin therapy, until she had 2 provoked seizures in April 2018 and July 2018. She has transitioned to Levetiracetam with no further seizures since July 2018 on Levetiracetam 1056m BID, refills sent. She is aware of [place] driving laws to stop driving after a seizure until 6 months seizure-free. Follow-up in 1 year, she knows to call for any changes.   Thank you for allowing me to participate in her care.  Please do not hesitate to call for any questions or concerns.   KEllouise Newer M.D.   CC: Dr. STamala Julian

## 2021-01-09 NOTE — Patient Instructions (Signed)
Always a pleasure to see you. Continue Keppra 500mg : Take 2 tablets twice a day. Follow-up in 1 year, call for any changes   Seizure Precautions: 1. If medication has been prescribed for you to prevent seizures, take it exactly as directed.  Do not stop taking the medicine without talking to your doctor first, even if you have not had a seizure in a long time.   2. Avoid activities in which a seizure would cause danger to yourself or to others.  Don't operate dangerous machinery, swim alone, or climb in high or dangerous places, such as on ladders, roofs, or girders.  Do not drive unless your doctor says you may.  3. If you have any warning that you may have a seizure, lay down in a safe place where you can't hurt yourself.    4.  No driving for 6 months from last seizure, as per [facility].   Please refer to the following link on the [place] website for more information: http://www.epilepsyfoundation.org/answerplace/Social/driving/drivingu.cfm   5.  Maintain good sleep hygiene. Avoid alcohol.  6.  Contact your doctor if you have any problems that may be related to the medicine you are taking.  7.  Call 911 and bring the patient back to the ED if:        A.  The seizure lasts longer than 5 minutes.       B.  The patient doesn't awaken shortly after the seizure  C.  The patient has new problems such as difficulty seeing, speaking or moving  D.  The patient was injured during the seizure  E.  The patient has a temperature over 102 F (39C)  F.  The patient vomited and now is having trouble breathing

## 2021-01-13 ENCOUNTER — Other Ambulatory Visit: Payer: Self-pay | Admitting: Neurology

## 2021-01-13 DIAGNOSIS — G40B09 Juvenile myoclonic epilepsy, not intractable, without status epilepticus: Secondary | ICD-10-CM

## 2021-01-26 DIAGNOSIS — D708 Other neutropenia: Secondary | ICD-10-CM | POA: Diagnosis not present

## 2021-01-26 DIAGNOSIS — D473 Essential (hemorrhagic) thrombocythemia: Secondary | ICD-10-CM | POA: Diagnosis not present

## 2021-01-26 DIAGNOSIS — Z7983 Long term (current) use of bisphosphonates: Secondary | ICD-10-CM | POA: Diagnosis not present

## 2021-01-26 DIAGNOSIS — Z8669 Personal history of other diseases of the nervous system and sense organs: Secondary | ICD-10-CM | POA: Diagnosis not present

## 2021-01-26 DIAGNOSIS — Z7982 Long term (current) use of aspirin: Secondary | ICD-10-CM | POA: Diagnosis not present

## 2021-01-26 DIAGNOSIS — M810 Age-related osteoporosis without current pathological fracture: Secondary | ICD-10-CM | POA: Diagnosis not present

## 2021-01-26 DIAGNOSIS — C9001 Multiple myeloma in remission: Secondary | ICD-10-CM | POA: Diagnosis not present

## 2021-01-26 DIAGNOSIS — Z7952 Long term (current) use of systemic steroids: Secondary | ICD-10-CM | POA: Diagnosis not present

## 2021-01-26 DIAGNOSIS — Z9484 Stem cells transplant status: Secondary | ICD-10-CM | POA: Diagnosis not present

## 2021-01-26 DIAGNOSIS — Z79899 Other long term (current) drug therapy: Secondary | ICD-10-CM | POA: Diagnosis not present

## 2021-01-26 DIAGNOSIS — S42292D Other displaced fracture of upper end of left humerus, subsequent encounter for fracture with routine healing: Secondary | ICD-10-CM | POA: Diagnosis not present

## 2021-01-26 DIAGNOSIS — Z7984 Long term (current) use of oral hypoglycemic drugs: Secondary | ICD-10-CM | POA: Diagnosis not present

## 2021-01-26 DIAGNOSIS — E785 Hyperlipidemia, unspecified: Secondary | ICD-10-CM | POA: Diagnosis not present

## 2021-01-26 DIAGNOSIS — F419 Anxiety disorder, unspecified: Secondary | ICD-10-CM | POA: Diagnosis not present

## 2021-01-26 DIAGNOSIS — C9000 Multiple myeloma not having achieved remission: Secondary | ICD-10-CM | POA: Diagnosis not present

## 2021-01-26 DIAGNOSIS — R569 Unspecified convulsions: Secondary | ICD-10-CM | POA: Diagnosis not present

## 2021-01-27 DIAGNOSIS — M810 Age-related osteoporosis without current pathological fracture: Secondary | ICD-10-CM | POA: Insufficient documentation

## 2021-01-27 DIAGNOSIS — I10 Essential (primary) hypertension: Secondary | ICD-10-CM | POA: Insufficient documentation

## 2021-01-27 HISTORY — DX: Essential (primary) hypertension: I10

## 2021-02-06 ENCOUNTER — Ambulatory Visit: Payer: Medicare Other | Admitting: Neurology

## 2021-02-12 ENCOUNTER — Ambulatory Visit: Payer: Medicare Other

## 2021-02-26 DIAGNOSIS — C9000 Multiple myeloma not having achieved remission: Secondary | ICD-10-CM | POA: Diagnosis not present

## 2021-02-26 DIAGNOSIS — Z5111 Encounter for antineoplastic chemotherapy: Secondary | ICD-10-CM | POA: Diagnosis not present

## 2021-02-26 DIAGNOSIS — Z79899 Other long term (current) drug therapy: Secondary | ICD-10-CM | POA: Diagnosis not present

## 2021-03-09 DIAGNOSIS — C9000 Multiple myeloma not having achieved remission: Secondary | ICD-10-CM | POA: Diagnosis not present

## 2021-03-18 DIAGNOSIS — Z23 Encounter for immunization: Secondary | ICD-10-CM | POA: Diagnosis not present

## 2021-03-25 DIAGNOSIS — Z79899 Other long term (current) drug therapy: Secondary | ICD-10-CM | POA: Diagnosis not present

## 2021-03-25 DIAGNOSIS — C9000 Multiple myeloma not having achieved remission: Secondary | ICD-10-CM | POA: Diagnosis not present

## 2021-03-25 DIAGNOSIS — Z5111 Encounter for antineoplastic chemotherapy: Secondary | ICD-10-CM | POA: Diagnosis not present

## 2021-04-20 DIAGNOSIS — Z8669 Personal history of other diseases of the nervous system and sense organs: Secondary | ICD-10-CM | POA: Diagnosis not present

## 2021-04-20 DIAGNOSIS — D693 Immune thrombocytopenic purpura: Secondary | ICD-10-CM | POA: Diagnosis not present

## 2021-04-20 DIAGNOSIS — C9000 Multiple myeloma not having achieved remission: Secondary | ICD-10-CM | POA: Diagnosis not present

## 2021-04-20 DIAGNOSIS — Z7952 Long term (current) use of systemic steroids: Secondary | ICD-10-CM | POA: Diagnosis not present

## 2021-04-20 DIAGNOSIS — D708 Other neutropenia: Secondary | ICD-10-CM | POA: Diagnosis not present

## 2021-04-20 DIAGNOSIS — F411 Generalized anxiety disorder: Secondary | ICD-10-CM | POA: Diagnosis not present

## 2021-04-20 DIAGNOSIS — S42292D Other displaced fracture of upper end of left humerus, subsequent encounter for fracture with routine healing: Secondary | ICD-10-CM | POA: Diagnosis not present

## 2021-04-20 DIAGNOSIS — Z7982 Long term (current) use of aspirin: Secondary | ICD-10-CM | POA: Diagnosis not present

## 2021-04-20 DIAGNOSIS — Z79899 Other long term (current) drug therapy: Secondary | ICD-10-CM | POA: Diagnosis not present

## 2021-04-20 DIAGNOSIS — Z7984 Long term (current) use of oral hypoglycemic drugs: Secondary | ICD-10-CM | POA: Diagnosis not present

## 2021-04-20 DIAGNOSIS — E785 Hyperlipidemia, unspecified: Secondary | ICD-10-CM | POA: Diagnosis not present

## 2021-04-20 DIAGNOSIS — M62830 Muscle spasm of back: Secondary | ICD-10-CM | POA: Diagnosis not present

## 2021-04-20 DIAGNOSIS — R030 Elevated blood-pressure reading, without diagnosis of hypertension: Secondary | ICD-10-CM | POA: Diagnosis not present

## 2021-04-20 DIAGNOSIS — M810 Age-related osteoporosis without current pathological fracture: Secondary | ICD-10-CM | POA: Diagnosis not present

## 2021-04-20 DIAGNOSIS — Z7983 Long term (current) use of bisphosphonates: Secondary | ICD-10-CM | POA: Diagnosis not present

## 2021-04-23 ENCOUNTER — Ambulatory Visit
Admission: RE | Admit: 2021-04-23 | Discharge: 2021-04-23 | Disposition: A | Discharge status: Home / Self Care | Payer: Medicare Other | Source: Ambulatory Visit | Attending: Obstetrics and Gynecology | Admitting: Obstetrics and Gynecology

## 2021-04-23 ENCOUNTER — Other Ambulatory Visit: Payer: Self-pay

## 2021-04-23 DIAGNOSIS — Z1231 Encounter for screening mammogram for malignant neoplasm of breast: Secondary | ICD-10-CM | POA: Diagnosis not present

## 2021-05-18 DIAGNOSIS — Z79899 Other long term (current) drug therapy: Secondary | ICD-10-CM | POA: Diagnosis not present

## 2021-05-18 DIAGNOSIS — Z5111 Encounter for antineoplastic chemotherapy: Secondary | ICD-10-CM | POA: Diagnosis not present

## 2021-05-18 DIAGNOSIS — C9000 Multiple myeloma not having achieved remission: Secondary | ICD-10-CM | POA: Diagnosis not present

## 2021-05-18 DIAGNOSIS — M810 Age-related osteoporosis without current pathological fracture: Secondary | ICD-10-CM | POA: Diagnosis not present

## 2021-06-08 DIAGNOSIS — R059 Cough, unspecified: Secondary | ICD-10-CM | POA: Diagnosis not present

## 2021-06-08 DIAGNOSIS — Z20822 Contact with and (suspected) exposure to covid-19: Secondary | ICD-10-CM | POA: Diagnosis not present

## 2021-06-08 DIAGNOSIS — R6883 Chills (without fever): Secondary | ICD-10-CM | POA: Diagnosis not present

## 2021-06-08 DIAGNOSIS — J029 Acute pharyngitis, unspecified: Secondary | ICD-10-CM | POA: Diagnosis not present

## 2021-06-08 DIAGNOSIS — J209 Acute bronchitis, unspecified: Secondary | ICD-10-CM | POA: Diagnosis not present

## 2021-06-15 DIAGNOSIS — M25512 Pain in left shoulder: Secondary | ICD-10-CM | POA: Diagnosis not present

## 2021-06-15 DIAGNOSIS — C9000 Multiple myeloma not having achieved remission: Secondary | ICD-10-CM | POA: Diagnosis not present

## 2021-06-15 DIAGNOSIS — Z5111 Encounter for antineoplastic chemotherapy: Secondary | ICD-10-CM | POA: Diagnosis not present

## 2021-06-15 DIAGNOSIS — J069 Acute upper respiratory infection, unspecified: Secondary | ICD-10-CM | POA: Diagnosis not present

## 2021-07-13 DIAGNOSIS — M542 Cervicalgia: Secondary | ICD-10-CM | POA: Diagnosis not present

## 2021-07-13 DIAGNOSIS — D709 Neutropenia, unspecified: Secondary | ICD-10-CM | POA: Diagnosis not present

## 2021-07-13 DIAGNOSIS — D473 Essential (hemorrhagic) thrombocythemia: Secondary | ICD-10-CM | POA: Diagnosis not present

## 2021-07-13 DIAGNOSIS — M810 Age-related osteoporosis without current pathological fracture: Secondary | ICD-10-CM | POA: Diagnosis not present

## 2021-07-13 DIAGNOSIS — Z7982 Long term (current) use of aspirin: Secondary | ICD-10-CM | POA: Diagnosis not present

## 2021-07-13 DIAGNOSIS — C9000 Multiple myeloma not having achieved remission: Secondary | ICD-10-CM | POA: Diagnosis not present

## 2021-07-13 DIAGNOSIS — Z79899 Other long term (current) drug therapy: Secondary | ICD-10-CM | POA: Diagnosis not present

## 2021-07-13 DIAGNOSIS — Z87891 Personal history of nicotine dependence: Secondary | ICD-10-CM | POA: Diagnosis not present

## 2021-07-13 DIAGNOSIS — G40409 Other generalized epilepsy and epileptic syndromes, not intractable, without status epilepticus: Secondary | ICD-10-CM | POA: Diagnosis not present

## 2021-07-13 DIAGNOSIS — F411 Generalized anxiety disorder: Secondary | ICD-10-CM | POA: Diagnosis not present

## 2021-07-13 DIAGNOSIS — E7800 Pure hypercholesterolemia, unspecified: Secondary | ICD-10-CM | POA: Diagnosis not present

## 2021-07-13 DIAGNOSIS — Z791 Long term (current) use of non-steroidal anti-inflammatories (NSAID): Secondary | ICD-10-CM | POA: Diagnosis not present

## 2021-07-13 DIAGNOSIS — I10 Essential (primary) hypertension: Secondary | ICD-10-CM | POA: Diagnosis not present

## 2021-07-13 DIAGNOSIS — M25519 Pain in unspecified shoulder: Secondary | ICD-10-CM | POA: Diagnosis not present

## 2021-07-13 DIAGNOSIS — Z9484 Stem cells transplant status: Secondary | ICD-10-CM | POA: Diagnosis not present

## 2021-08-10 DIAGNOSIS — Z5111 Encounter for antineoplastic chemotherapy: Secondary | ICD-10-CM | POA: Diagnosis not present

## 2021-08-10 DIAGNOSIS — C9000 Multiple myeloma not having achieved remission: Secondary | ICD-10-CM | POA: Diagnosis not present

## 2021-08-13 DIAGNOSIS — Z0000 Encounter for general adult medical examination without abnormal findings: Secondary | ICD-10-CM | POA: Diagnosis not present

## 2021-08-13 DIAGNOSIS — C9000 Multiple myeloma not having achieved remission: Secondary | ICD-10-CM | POA: Diagnosis not present

## 2021-08-13 DIAGNOSIS — Z1389 Encounter for screening for other disorder: Secondary | ICD-10-CM | POA: Diagnosis not present

## 2021-08-13 DIAGNOSIS — I10 Essential (primary) hypertension: Secondary | ICD-10-CM | POA: Diagnosis not present

## 2021-08-13 DIAGNOSIS — F419 Anxiety disorder, unspecified: Secondary | ICD-10-CM | POA: Diagnosis not present

## 2021-08-13 DIAGNOSIS — Z1211 Encounter for screening for malignant neoplasm of colon: Secondary | ICD-10-CM | POA: Diagnosis not present

## 2021-08-13 DIAGNOSIS — E7800 Pure hypercholesterolemia, unspecified: Secondary | ICD-10-CM | POA: Diagnosis not present

## 2021-08-13 DIAGNOSIS — G40909 Epilepsy, unspecified, not intractable, without status epilepticus: Secondary | ICD-10-CM | POA: Diagnosis not present

## 2021-08-13 DIAGNOSIS — Z8249 Family history of ischemic heart disease and other diseases of the circulatory system: Secondary | ICD-10-CM | POA: Diagnosis not present

## 2021-09-02 DIAGNOSIS — D225 Melanocytic nevi of trunk: Secondary | ICD-10-CM | POA: Diagnosis not present

## 2021-09-02 DIAGNOSIS — L570 Actinic keratosis: Secondary | ICD-10-CM | POA: Diagnosis not present

## 2021-09-02 DIAGNOSIS — L821 Other seborrheic keratosis: Secondary | ICD-10-CM | POA: Diagnosis not present

## 2021-09-02 DIAGNOSIS — L814 Other melanin hyperpigmentation: Secondary | ICD-10-CM | POA: Diagnosis not present

## 2021-09-07 DIAGNOSIS — Z5111 Encounter for antineoplastic chemotherapy: Secondary | ICD-10-CM | POA: Diagnosis not present

## 2021-09-07 DIAGNOSIS — C9000 Multiple myeloma not having achieved remission: Secondary | ICD-10-CM | POA: Diagnosis not present

## 2021-09-30 ENCOUNTER — Ambulatory Visit (INDEPENDENT_AMBULATORY_CARE_PROVIDER_SITE_OTHER): Payer: Medicare Other | Admitting: Cardiology

## 2021-09-30 ENCOUNTER — Other Ambulatory Visit: Payer: Self-pay

## 2021-09-30 ENCOUNTER — Encounter: Payer: Self-pay | Admitting: Cardiology

## 2021-09-30 VITALS — BP 162/90 | Ht 63.0 in | Wt 121.0 lb

## 2021-09-30 DIAGNOSIS — E782 Mixed hyperlipidemia: Secondary | ICD-10-CM

## 2021-09-30 DIAGNOSIS — Z9484 Stem cells transplant status: Secondary | ICD-10-CM | POA: Diagnosis not present

## 2021-09-30 DIAGNOSIS — Z8249 Family history of ischemic heart disease and other diseases of the circulatory system: Secondary | ICD-10-CM

## 2021-09-30 DIAGNOSIS — Z8279 Family history of other congenital malformations, deformations and chromosomal abnormalities: Secondary | ICD-10-CM | POA: Diagnosis not present

## 2021-09-30 DIAGNOSIS — C9001 Multiple myeloma in remission: Secondary | ICD-10-CM

## 2021-09-30 DIAGNOSIS — R0789 Other chest pain: Secondary | ICD-10-CM

## 2021-09-30 DIAGNOSIS — R9431 Abnormal electrocardiogram [ECG] [EKG]: Secondary | ICD-10-CM

## 2021-09-30 HISTORY — DX: Mixed hyperlipidemia: E78.2

## 2021-09-30 HISTORY — DX: Family history of ischemic heart disease and other diseases of the circulatory system: Z82.49

## 2021-09-30 HISTORY — DX: Other chest pain: R07.89

## 2021-09-30 NOTE — Assessment & Plan Note (Addendum)
Left flank discomfort at the base of rib cage.  No evidence of rash noted.  Worse with palpation.  She has been taking Tylenol/Advil for this.  She has called her oncologist.  If pain persists, she has been told to get a chest x-ray.  Agree with plan.  Does not sound cardiac in etiology.  Likely musculoskeletal.

## 2021-09-30 NOTE — Assessment & Plan Note (Signed)
Stem cell transplant 2018.  Excellent numbers recently.

## 2021-09-30 NOTE — Patient Instructions (Signed)
Medication Instructions:  Your physician recommends that you continue on your current medications as directed. Please refer to the Current Medication list given to you today.  *If you need a refill on your cardiac medications before your next appointment, please call your pharmacy*   Lab Work: One ordered  If you have labs (blood work) drawn today and your tests are completely normal, you will receive your results only by: [place] (if you have MyChart) OR A paper copy in the mail If you have any lab test that is abnormal or we need to change your treatment, we will call you to review the results.   Testing/Procedures: Your physician recommends you have a CT Calcium Scoring.  This is an out of pocket expense of $99.   Follow-Up: At [facility], you and your health needs are our priority.  As part of our continuing mission to provide you with exceptional heart care, we have created designated Provider Care Teams.  These Care Teams include your primary Cardiologist (physician) and Advanced Practice Providers (APPs -  Physician Assistants and Nurse Practitioners) who all work together to provide you with the care you need, when you need it.  We recommend signing up for the patient portal called "MyChart".  Sign up information is provided on this After Visit Summary.  MyChart is used to connect with patients for Virtual Visits (Telemedicine).  Patients are able to view lab/test results, encounter notes, upcoming appointments, etc.  Non-urgent messages can be sent to your provider as well.   To learn more about what you can do with MyChart, go to [email or web address].    Your next appointment:   AS NEEDED  The format for your next appointment:     Provider:       Other Instructions

## 2021-09-30 NOTE — Assessment & Plan Note (Signed)
LDL 163 at last check.  Total cholesterol 268 triglycerides 187 HDL 71.  Checking coronary calcium score.  If coronary plaque is present, we will start Crestor 10 mg and recheck lipid panel in 3 months with ALT.  Heart healthy diet.  Her son recently had a coronary calcium score as well and it was elevated.  He is trying to ascribe to a plant-based diet.

## 2021-09-30 NOTE — Assessment & Plan Note (Signed)
Her brother in his 49s recently had myocardial infarction.  Widow maker lesion.

## 2021-09-30 NOTE — Progress Notes (Signed)
Cardiology Office Note:    Date:  09/30/2021   ID:  Catherine Bullock, DOB [birth year], MRN [id number]  PCP:  Catherine Ada, MD   [facility] Providers Cardiologist:  None     Referring MD: Catherine Ada, MD   History of Present Illness:    Catherine Bullock is a 75 y.o. female here for the evaluation of family history of heart disease.   09/08/2021 Notes from Dr. Edwin Bullock reviewed: Her brother recently had an MI. Catherine Bullock is a stem cell transplant recipient and has multiple myeloma in remission.  Today: She is accompanied by her husband. Overall, she appears well.   Lately she has been having left-sided flank/chest pain for a week. When she bends or palpates the area, her pain does change. She would describe her discomfort as "aggravating" rather than "pain."   Her husband reports that she has rare episodes of racing heart rates when she is nervous.   She has not been formally exercising. Generally she enjoys exercise and plans to return to her routines soon.   Her most recent LDL was 163, which she notes is the highest she has seen.  She is not a smoker.  Her father had cardiovascular issues in his 18's. Her brother recently had a heart attack (widowmaker), but has recovered well.   She denies any shortness of breath. No lightheadedness, headaches, syncope, orthopnea, PND, lower extremity edema or exertional symptoms.  In 2018 she was dx with multiple myeloma, and is now in remission.   Past Medical History:  Diagnosis Date   Anxiety    Colon adenoma    Degenerative disk disease    Epilepsy ([place])    last seizure 1987   GERD (gastroesophageal reflux disease)    Hypertension    IgG monoclonal gammopathy    Iron deficiency anemia    Multiple myeloma ([place]) 11/08/2016   MVP (mitral valve prolapse)    Osteoporosis    Thrombocytosis     Past Surgical History:  Procedure Laterality Date   BREAST BIOPSY  1992   benign   TONSILLECTOMY  1961    Current  Medications: Current Meds  Medication Sig   acyclovir (ZOVIRAX) 400 MG tablet Take 400 mg by mouth 2 (two) times daily.   ALPRAZolam (XANAX) 0.25 MG tablet Take 1 tablet (0.25 mg total) by mouth every 8 (eight) hours as needed.   aspirin EC 81 MG tablet Take 81 mg by mouth daily.   escitalopram (LEXAPRO) 20 MG tablet Take 20 mg by mouth daily.   levETIRAcetam (KEPPRA) 500 MG tablet Take 2 tablets (1,000 mg total) by mouth 2 (two) times daily.   Multiple Vitamin (MULTIVITAMIN) tablet Take 1 tablet by mouth daily.   pomalidomide (POMALYST) 1 MG capsule Take 1 mg by mouth daily. Take 1 tablet by mouth for 7 days then rest for 7 days, then repeat for a 28 day cycle   REVLIMID 5 MG capsule      Allergies:   Thyroid hormones   Social History   Socioeconomic History   Marital status: Married    Spouse name: Not on file   Number of children: Not on file   Years of education: Not on file   Highest education level: Not on file  Occupational History   Not on file  Tobacco Use   Smoking status: Former    Types: Cigarettes    Quit date: 06/21/1979    Years since quitting: 42.3   Smokeless tobacco: Never  Vaping Use   Vaping Use: Never used  Substance and Sexual Activity   Alcohol use: Yes    Alcohol/week: 2.0 standard drinks    Types: 2 Glasses of wine per week   Drug use: No   Sexual activity: Yes  Other Topics Concern   Not on file  Social History Narrative   Right handed   Two story home   Drinks caffeine   Social Determinants of Health   Financial Resource Strain: Not on file  Food Insecurity: Not on file  Transportation Needs: Not on file  Physical Activity: Not on file  Stress: Not on file  Social Connections: Not on file     Family History: The patient's family history includes Alzheimer's disease in her mother; Multiple myeloma in her father; Nephrolithiasis in her mother; Prostate cancer in her father; Ulcers in her mother. There is no history of Colon  cancer.  ROS:   Please see the history of present illness.    (+) Left-sided flank/chest pain All other systems reviewed and are negative.  EKGs/Labs/Other Studies Reviewed:    The following studies were reviewed today: No prior cardiovascular studies available.  Prior office notes lab work Care Everywhere reviewed.  EKG:  EKG is personally reviewed and interpreted. 09/30/2021: Sinus bradycardia. Rate 58 bpm.  Recent Labs: No results found for requested labs within last 8760 hours.   Recent Lipid Panel No results found for: CHOL, TRIG, HDL, CHOLHDL, VLDL, LDLCALC, LDLDIRECT   Risk Assessment/Calculations:          Physical Exam:    VS:  BP (!) 162/90   Ht 5' 3"  (1.6 m)   Wt 121 lb (54.9 kg)   SpO2 98%   BMI 21.43 kg/m     Wt Readings from Last 3 Encounters:  09/30/21 121 lb (54.9 kg)  01/09/21 121 lb (54.9 kg)  07/21/18 119 lb 6 oz (54.1 kg)     GEN: Well nourished, well developed in no acute distress HEENT: Normal NECK: No JVD; No carotid bruits LYMPHATICS: No lymphadenopathy CARDIAC: RRR, no murmurs, rubs, gallops RESPIRATORY:  Clear to auscultation without rales, wheezing or rhonchi  ABDOMEN: Soft, non-tender, non-distended MUSCULOSKELETAL:  No edema; No deformity. Left-sided tenderness, no rashes, just below the left rib cage flank pain.  Midaxillary line SKIN: Warm and dry NEUROLOGIC:  Alert and oriented x 3 PSYCHIATRIC:  Normal affect   ASSESSMENT:    1. Family history of congenital heart disease in father   2. H/O stem cell transplant ([place])   3. Mixed hyperlipidemia   4. Multiple myeloma in remission ([place])   5. Atypical chest pain   6. Family history of early CAD   23. Abnormal electrocardiogram (ECG) (EKG)    PLAN:    In order of problems listed above:  Mixed hyperlipidemia LDL 163 at last check.  Total cholesterol 268 triglycerides 187 HDL 71.  Checking coronary calcium score.  If coronary plaque is present, we will start Crestor 10 mg  and recheck lipid panel in 3 months with ALT.  Heart healthy diet.  Her son recently had a coronary calcium score as well and it was elevated.  He is trying to ascribe to a plant-based diet.  Multiple myeloma ([place]) Stem cell transplant 2018.  Excellent numbers recently.  Atypical chest pain Left flank discomfort at the base of rib cage.  No evidence of rash noted.  Worse with palpation.  She has been taking Tylenol/Advil for this.  She has called her oncologist.  If pain persists, she has been told to get a chest x-ray.  Agree with plan.  Does not sound cardiac in etiology.  Likely musculoskeletal.  Family history of early CAD Her brother in his 49s recently had myocardial infarction.  Widow maker lesion.    Follow-up: PRN. Will follow-up with results of testing.  Medication Adjustments/Labs and Tests Ordered: Current medicines are reviewed at length with the patient today.  Concerns regarding medicines are outlined above.   Orders Placed This Encounter  Procedures   CT CARDIAC SCORING (SELF PAY ONLY)   EKG 12-Lead   No orders of the defined types were placed in this encounter.  Patient Instructions  Medication Instructions:  Your physician recommends that you continue on your current medications as directed. Please refer to the Current Medication list given to you today.  *If you need a refill on your cardiac medications before your next appointment, please call your pharmacy*   Lab Work: One ordered  If you have labs (blood work) drawn today and your tests are completely normal, you will receive your results only by: [place] (if you have MyChart) OR A paper copy in the mail If you have any lab test that is abnormal or we need to change your treatment, we will call you to review the results.   Testing/Procedures: Your physician recommends you have a CT Calcium Scoring.  This is an out of pocket expense of $99.   Follow-Up: At [facility], you and your health  needs are our priority.  As part of our continuing mission to provide you with exceptional heart care, we have created designated Provider Care Teams.  These Care Teams include your primary Cardiologist (physician) and Advanced Practice Providers (APPs -  Physician Assistants and Nurse Practitioners) who all work together to provide you with the care you need, when you need it.  We recommend signing up for the patient portal called "MyChart".  Sign up information is provided on this After Visit Summary.  MyChart is used to connect with patients for Virtual Visits (Telemedicine).  Patients are able to view lab/test results, encounter notes, upcoming appointments, etc.  Non-urgent messages can be sent to your provider as well.   To learn more about what you can do with MyChart, go to [email or web address].    Your next appointment:   AS NEEDED  The format for your next appointment:     Provider:       Other Instructions    I,Mathew Stumpf,acting as a scribe for Candee Furbish, MD.,have documented all relevant documentation on the behalf of Candee Furbish, MD,as directed by  Candee Furbish, MD while in the presence of Candee Furbish, MD.  I, Candee Furbish, MD, have reviewed all documentation for this visit. The documentation on 09/30/21 for the exam, diagnosis, procedures, and orders are all accurate and complete.   Signed, Candee Furbish, MD  09/30/2021 5:06 PM    [facility]

## 2021-10-05 DIAGNOSIS — F419 Anxiety disorder, unspecified: Secondary | ICD-10-CM | POA: Diagnosis not present

## 2021-10-05 DIAGNOSIS — C9001 Multiple myeloma in remission: Secondary | ICD-10-CM | POA: Diagnosis not present

## 2021-10-05 DIAGNOSIS — I10 Essential (primary) hypertension: Secondary | ICD-10-CM | POA: Diagnosis not present

## 2021-10-05 DIAGNOSIS — G40409 Other generalized epilepsy and epileptic syndromes, not intractable, without status epilepticus: Secondary | ICD-10-CM | POA: Diagnosis not present

## 2021-10-05 DIAGNOSIS — M810 Age-related osteoporosis without current pathological fracture: Secondary | ICD-10-CM | POA: Diagnosis not present

## 2021-10-05 DIAGNOSIS — Z7982 Long term (current) use of aspirin: Secondary | ICD-10-CM | POA: Diagnosis not present

## 2021-10-05 DIAGNOSIS — D75839 Thrombocytosis, unspecified: Secondary | ICD-10-CM | POA: Diagnosis not present

## 2021-10-05 DIAGNOSIS — S42292D Other displaced fracture of upper end of left humerus, subsequent encounter for fracture with routine healing: Secondary | ICD-10-CM | POA: Diagnosis not present

## 2021-10-05 DIAGNOSIS — Z87891 Personal history of nicotine dependence: Secondary | ICD-10-CM | POA: Diagnosis not present

## 2021-10-05 DIAGNOSIS — Z79899 Other long term (current) drug therapy: Secondary | ICD-10-CM | POA: Diagnosis not present

## 2021-10-05 DIAGNOSIS — D709 Neutropenia, unspecified: Secondary | ICD-10-CM | POA: Diagnosis not present

## 2021-10-05 DIAGNOSIS — C9000 Multiple myeloma not having achieved remission: Secondary | ICD-10-CM | POA: Diagnosis not present

## 2021-10-05 DIAGNOSIS — D473 Essential (hemorrhagic) thrombocythemia: Secondary | ICD-10-CM | POA: Diagnosis not present

## 2021-10-05 DIAGNOSIS — Z9484 Stem cells transplant status: Secondary | ICD-10-CM | POA: Diagnosis not present

## 2021-10-07 DIAGNOSIS — H43813 Vitreous degeneration, bilateral: Secondary | ICD-10-CM | POA: Diagnosis not present

## 2021-10-07 DIAGNOSIS — H2513 Age-related nuclear cataract, bilateral: Secondary | ICD-10-CM | POA: Diagnosis not present

## 2021-10-07 DIAGNOSIS — H25013 Cortical age-related cataract, bilateral: Secondary | ICD-10-CM | POA: Diagnosis not present

## 2021-10-07 DIAGNOSIS — H5203 Hypermetropia, bilateral: Secondary | ICD-10-CM | POA: Diagnosis not present

## 2021-10-08 DIAGNOSIS — F419 Anxiety disorder, unspecified: Secondary | ICD-10-CM | POA: Diagnosis not present

## 2021-10-08 DIAGNOSIS — Z23 Encounter for immunization: Secondary | ICD-10-CM | POA: Diagnosis not present

## 2021-10-21 ENCOUNTER — Other Ambulatory Visit: Payer: Self-pay

## 2021-10-21 ENCOUNTER — Ambulatory Visit (INDEPENDENT_AMBULATORY_CARE_PROVIDER_SITE_OTHER)
Admission: RE | Admit: 2021-10-21 | Discharge: 2021-10-21 | Disposition: A | Discharge status: Home / Self Care | Payer: Self-pay | Source: Ambulatory Visit | Attending: Cardiology | Admitting: Cardiology

## 2021-10-21 DIAGNOSIS — Z8279 Family history of other congenital malformations, deformations and chromosomal abnormalities: Secondary | ICD-10-CM

## 2021-10-21 DIAGNOSIS — R9431 Abnormal electrocardiogram [ECG] [EKG]: Secondary | ICD-10-CM

## 2021-10-22 ENCOUNTER — Telehealth: Payer: Self-pay

## 2021-10-22 DIAGNOSIS — E782 Mixed hyperlipidemia: Secondary | ICD-10-CM

## 2021-10-22 MED ORDER — ROSUVASTATIN CALCIUM 10 MG PO TABS: 10.0000 mg | ORAL_TABLET | Freq: Every day | ORAL | 3 refills | Status: DC

## 2021-10-22 NOTE — Telephone Encounter (Signed)
-----   Message from Jerline Pain, MD sent at 10/22/2021  6:28 AM EST ----- 1. Coronary calcium score of 31. This was 36 percentile for age-, race-, and sex-matched controls.  2.  Mild aortic atherosclerosis.   Let's start Crestor 10mg  once a day Repeat lipid panel in 3 months with ALT.

## 2021-10-22 NOTE — Telephone Encounter (Signed)
Attempted phone call to pt.  Left voicemail message to contact triage at [phone]. 

## 2021-10-22 NOTE — Telephone Encounter (Signed)
The patient has been notified of the result and verbalized understanding.  All questions (if any) were answered. Darrell Jewel, RN 10/22/2021 11:19 AM    Sent in new medication and set up labs.

## 2021-10-23 ENCOUNTER — Encounter (HOSPITAL_BASED_OUTPATIENT_CLINIC_OR_DEPARTMENT_OTHER): Payer: Self-pay

## 2021-10-23 ENCOUNTER — Emergency Department (HOSPITAL_BASED_OUTPATIENT_CLINIC_OR_DEPARTMENT_OTHER): Payer: Medicare Other

## 2021-10-23 ENCOUNTER — Emergency Department (HOSPITAL_BASED_OUTPATIENT_CLINIC_OR_DEPARTMENT_OTHER)
Admission: EM | Admit: 2021-10-23 | Discharge: 2021-10-23 | Disposition: A | Discharge status: Home / Self Care | Payer: Medicare Other | Attending: Emergency Medicine | Admitting: Emergency Medicine

## 2021-10-23 ENCOUNTER — Other Ambulatory Visit: Payer: Self-pay

## 2021-10-23 DIAGNOSIS — Z87891 Personal history of nicotine dependence: Secondary | ICD-10-CM | POA: Insufficient documentation

## 2021-10-23 DIAGNOSIS — I10 Essential (primary) hypertension: Secondary | ICD-10-CM | POA: Diagnosis not present

## 2021-10-23 DIAGNOSIS — W06XXXA Fall from bed, initial encounter: Secondary | ICD-10-CM | POA: Diagnosis not present

## 2021-10-23 DIAGNOSIS — Z7982 Long term (current) use of aspirin: Secondary | ICD-10-CM | POA: Diagnosis not present

## 2021-10-23 DIAGNOSIS — S62102A Fracture of unspecified carpal bone, left wrist, initial encounter for closed fracture: Secondary | ICD-10-CM | POA: Diagnosis not present

## 2021-10-23 DIAGNOSIS — S52352A Displaced comminuted fracture of shaft of radius, left arm, initial encounter for closed fracture: Secondary | ICD-10-CM | POA: Diagnosis not present

## 2021-10-23 DIAGNOSIS — S52502A Unspecified fracture of the lower end of left radius, initial encounter for closed fracture: Secondary | ICD-10-CM | POA: Diagnosis not present

## 2021-10-23 DIAGNOSIS — S6992XA Unspecified injury of left wrist, hand and finger(s), initial encounter: Secondary | ICD-10-CM | POA: Diagnosis present

## 2021-10-23 MED ORDER — OXYCODONE-ACETAMINOPHEN 5-325 MG PO TABS: 1.0000 | ORAL_TABLET | Freq: Four times a day (QID) | ORAL | 0 refills | Status: AC | PRN

## 2021-10-23 MED ORDER — OXYCODONE-ACETAMINOPHEN 5-325 MG PO TABS
1.0000 | ORAL_TABLET | Freq: Once | ORAL | Status: AC
  Administered 2021-10-23: 15:00:00 1 via ORAL
  Filled 2021-10-23: qty 1

## 2021-10-23 NOTE — ED Triage Notes (Signed)
Pt states she tripped on carpet last night and tried catching herself, injuring L wrist. Pt denies head injury. NAD during triage. PMS intact distally.

## 2021-10-23 NOTE — ED Provider Notes (Signed)
[facility] EMERGENCY DEPT Provider Note   CSN: [id number] Arrival date & time: 10/23/21  1253     History Chief Complaint  Patient presents with   Fall   Wrist Injury    Catherine Bullock is a 75 y.o. female.  With no pertinent past medical history presents to the emergency department with left wrist pain.  Patient states that last night she was stepping out of bed when she tripped and fell.  She states that she has a small stepstool that she uses getting out of her bed.  She states that she stepped down and slipped and caught herself on her hands.  Denies hitting her head or loss of consciousness.  She states that she had immediate pain in the left hand but did not hear/feel any pops or cracks.  She states she had swelling to the left wrist and pain with palpation.  Not anticoagulated.  No previous surgeries on the left wrist.   Fall  Wrist Injury     Past Medical History:  Diagnosis Date   Anxiety    Colon adenoma    Degenerative disk disease    Epilepsy ([place])    last seizure 1987   GERD (gastroesophageal reflux disease)    Hypertension    IgG monoclonal gammopathy    Iron deficiency anemia    Multiple myeloma ([place]) 11/08/2016   MVP (mitral valve prolapse)    Osteoporosis    Thrombocytosis     Patient Active Problem List   Diagnosis Date Noted   Mixed hyperlipidemia 09/30/2021   Atypical chest pain 09/30/2021   Family history of early CAD 09/30/2021   Nonintractable juvenile myoclonic epilepsy without status epilepticus ([place]) 01/12/2017   Multiple myeloma ([place]) 11/08/2016   Anxiety 11/08/2016   Liver dysfunction 11/08/2016   Leukopenia 12/14/2011   MGUS (monoclonal gammopathy of unknown significance) 12/14/2011   Anemia, iron deficiency 12/14/2011    Past Surgical History:  Procedure Laterality Date   BREAST BIOPSY  1992   benign   TONSILLECTOMY  1961     OB History   No obstetric history on file.     Family History  Problem Relation  Age of Onset   Nephrolithiasis Mother    Alzheimer's disease Mother    Ulcers Mother    Prostate cancer Father    Multiple myeloma Father    Colon cancer Neg Hx     Social History   Tobacco Use   Smoking status: Former    Types: Cigarettes    Quit date: 06/21/1979    Years since quitting: 42.3   Smokeless tobacco: Never  Vaping Use   Vaping Use: Never used  Substance Use Topics   Alcohol use: Yes    Alcohol/week: 2.0 standard drinks    Types: 2 Glasses of wine per week   Drug use: No    Home Medications Prior to Admission medications   Medication Sig Start Date End Date Taking? Authorizing Provider  acyclovir (ZOVIRAX) 400 MG tablet Take 400 mg by mouth 2 (two) times daily. 10/17/20   [provider]  ALPRAZolam Duanne Moron) 0.25 MG tablet Take 1 tablet (0.25 mg total) by mouth every 8 (eight) hours as needed. 11/08/16   Curt Bears, MD  aspirin EC 81 MG tablet Take 81 mg by mouth daily. 11/29/16   [provider]  escitalopram (LEXAPRO) 20 MG tablet Take 20 mg by mouth daily.    [provider]  levETIRAcetam (KEPPRA) 500 MG tablet Take 2 tablets (  1,000 mg total) by mouth 2 (two) times daily. 01/09/21   Cameron Sprang, MD  Multiple Vitamin (MULTIVITAMIN) tablet Take 1 tablet by mouth daily.    [provider]  pomalidomide (POMALYST) 1 MG capsule Take 1 mg by mouth daily. Take 1 tablet by mouth for 7 days then rest for 7 days, then repeat for a 28 day cycle    [provider]  REVLIMID 5 MG capsule  12/19/17   [provider]  rosuvastatin (CRESTOR) 10 MG tablet Take 1 tablet (10 mg total) by mouth daily. 10/22/21   Jerline Pain, MD    Allergies    Thyroid hormones  Review of Systems   Review of Systems  Musculoskeletal:  Positive for arthralgias and joint swelling. Negative for myalgias.  Skin:  Negative for wound.  Neurological:  Negative for numbness.  All other systems reviewed and are negative.  Physical  Exam Updated Vital Signs BP (!) 151/73    Pulse (!) 56    Temp 99 F (37.2 C)    Resp 18    Ht 5' 3"  (1.6 m)    Wt 52.6 kg    SpO2 97%    BMI 20.55 kg/m   Physical Exam Vitals and nursing note reviewed.  Constitutional:      General: She is not in acute distress.    Appearance: Normal appearance. She is normal weight. She is not ill-appearing or toxic-appearing.  HENT:     Head: Normocephalic and atraumatic.  Eyes:     General: No scleral icterus. Cardiovascular:     Pulses: Normal pulses.  Pulmonary:     Effort: Pulmonary effort is normal. No respiratory distress.  Musculoskeletal:        General: Swelling, tenderness and signs of injury present.     Right wrist: Normal.     Left wrist: Swelling, tenderness and bony tenderness present. Decreased range of motion. Normal pulse.     Cervical back: Normal range of motion and neck supple.     Comments: Swelling to left wrist and tenderness to palpation.  She does have minimal range of motion of the wrist but is able to wiggle her fingers.  She does somewhat decreased strength of grip.  Sensation intact at the hand and forearm.  Left wrist pain.   Skin:    General: Skin is warm and dry.     Capillary Refill: Capillary refill takes less than 2 seconds.     Findings: Bruising present. No rash.  Neurological:     General: No focal deficit present.     Mental Status: She is alert and oriented to person, place, and time. Mental status is at baseline.     Sensory: No sensory deficit.  Psychiatric:        Mood and Affect: Mood normal.        Behavior: Behavior normal.        Thought Content: Thought content normal.        Judgment: Judgment normal.    ED Results / Procedures / Treatments   Labs (all labs ordered are listed, but only abnormal results are displayed) Labs Reviewed - No data to display  EKG None  Radiology DG Wrist Complete Left  Result Date: 10/23/2021 CLINICAL DATA:  Fall.  Pain. EXAM: LEFT WRIST - COMPLETE 3+  VIEW COMPARISON:  None. FINDINGS: Mild osteopenia.  Scaphoid intact. Minimally displaced, comminuted intra-articular distal radius fracture. Adjacent ulna intact. IMPRESSION: Comminuted intra-articular distal radius fracture. Electronically Signed  By: Abigail Miyamoto M.D.   On: 10/23/2021 13:39    Procedures .Splint Application  Date/Time: 10/23/2021 5:23 PM Performed by: Mickie Hillier, PA-C Authorized by: Mickie Hillier, PA-C   Consent:    Consent obtained:  Verbal   Consent given by:  Patient   Risks, benefits, and alternatives were discussed: yes     Risks discussed:  Discoloration, numbness, pain and swelling   Alternatives discussed:  No treatment and delayed treatment Universal protocol:    Procedure explained and questions answered to patient or proxy's satisfaction: yes     Relevant documents present and verified: yes     Test results available: yes     Imaging studies available: yes     Required blood products, implants, devices, and special equipment available: yes     Site/side marked: yes     Immediately prior to procedure a time out was called: yes     Patient identity confirmed:  Verbally with patient Pre-procedure details:    Distal neurologic exam:  Normal   Distal perfusion: distal pulses strong   Procedure details:    Location:  Wrist   Wrist location:  L wrist   Cast type:  Short arm   Splint type:  Volar short arm   Supplies:  Fiberglass Post-procedure details:    Distal neurologic exam:  Unchanged   Distal perfusion: distal pulses strong, brisk capillary refill and unchanged     Procedure completion:  Tolerated   Post-procedure imaging: not applicable     Medications Ordered in ED Medications  oxyCODONE-acetaminophen (PERCOCET/ROXICET) 5-325 MG per tablet 1 tablet (1 tablet Oral Given 10/23/21 1523)    ED Course  I have reviewed the triage vital signs and the nursing notes.  Pertinent labs & imaging results that were available during my care of  the patient were reviewed by me and considered in my medical decision making (see chart for details).    MDM Rules/Calculators/A&P 75 year old female who presents emergency department with left wrist pain  Patient was found to have closed, comminuted, intra-articular distal radius fracture. The adjacent ulna is intact.  No other injuries on exam.  She did not hit her head or lose consciousness. I spoke with Orion Crook, PA-C who has reviewed the patient's imaging and chart.  He recommends short arm splinting and follow-up with orthopedics next week. Short arm splint was placed.  I have reevaluated the patient after splint was placed.  Distal pulses strong, brisk capillary refill.  She has sensation in her hand.  The cast is not too tight.  I have given her follow-up instructions to follow-up with Dr. Burney Gauze with orthopedic surgery next week.  I have given her prescription for Percocet to use over the next few days for acute pain.  She verbalizes understanding.  Vital signs are stable.  She is safe for discharge at this time. Final Clinical Impression(s) / ED Diagnoses Final diagnoses:  Closed fracture of left wrist, initial encounter    Rx / DC Orders ED Discharge Orders          Ordered    oxyCODONE-acetaminophen (PERCOCET/ROXICET) 5-325 MG tablet  Every 6 hours PRN        10/23/21 1636             Mickie Hillier, PA-C 10/23/21 2204    Horton, Alvin Critchley, DO 10/27/21 1636

## 2021-10-23 NOTE — Discharge Instructions (Addendum)
You were seen in the emergency department today for left wrist fracture.  While you are here we splinted your wrist.  We also gave you a dose of pain medication.  I have prescribed you a short course of pain medication for you to use until you are able to follow-up with orthopedics.  Please return to the emergency department if you begin to have significant increase in pain in your wrist, tightness in the cast, discoloration of your fingers.  You are to follow-up with Dr. Burney Gauze, who is orthopedic surgeon.  Please call today or early tomorrow morning to schedule appointment for next week to be seen. You have been prescribed you have been prescribed a medication that is considered an opiate. Opiates are pain medications that should be used with caution. It is important that you do not drive while taking this medication as it can cause drowsiness and impaired reaction times. Do not mix this medication with benzodiazepine medications or alcohol as this can cause respiratory depression. Additionally, opiates have addicting properties to them. Please use medication as prescribed by your provider.

## 2021-10-27 DIAGNOSIS — S52572A Other intraarticular fracture of lower end of left radius, initial encounter for closed fracture: Secondary | ICD-10-CM | POA: Diagnosis not present

## 2021-11-03 DIAGNOSIS — M810 Age-related osteoporosis without current pathological fracture: Secondary | ICD-10-CM | POA: Diagnosis not present

## 2021-11-03 DIAGNOSIS — C9000 Multiple myeloma not having achieved remission: Secondary | ICD-10-CM | POA: Diagnosis not present

## 2021-11-03 DIAGNOSIS — Z5111 Encounter for antineoplastic chemotherapy: Secondary | ICD-10-CM | POA: Diagnosis not present

## 2021-11-03 DIAGNOSIS — Z9484 Stem cells transplant status: Secondary | ICD-10-CM | POA: Diagnosis not present

## 2021-11-05 DIAGNOSIS — S52572A Other intraarticular fracture of lower end of left radius, initial encounter for closed fracture: Secondary | ICD-10-CM | POA: Diagnosis not present

## 2021-11-19 DIAGNOSIS — S52572D Other intraarticular fracture of lower end of left radius, subsequent encounter for closed fracture with routine healing: Secondary | ICD-10-CM | POA: Diagnosis not present

## 2021-11-19 DIAGNOSIS — S52572A Other intraarticular fracture of lower end of left radius, initial encounter for closed fracture: Secondary | ICD-10-CM | POA: Diagnosis not present

## 2021-12-14 DIAGNOSIS — C9000 Multiple myeloma not having achieved remission: Secondary | ICD-10-CM | POA: Diagnosis not present

## 2021-12-14 DIAGNOSIS — Z5111 Encounter for antineoplastic chemotherapy: Secondary | ICD-10-CM | POA: Diagnosis not present

## 2021-12-14 DIAGNOSIS — Z9484 Stem cells transplant status: Secondary | ICD-10-CM | POA: Diagnosis not present

## 2021-12-14 DIAGNOSIS — C9001 Multiple myeloma in remission: Secondary | ICD-10-CM | POA: Diagnosis not present

## 2022-01-02 ENCOUNTER — Other Ambulatory Visit: Payer: Self-pay | Admitting: Neurology

## 2022-01-02 DIAGNOSIS — G40B09 Juvenile myoclonic epilepsy, not intractable, without status epilepticus: Secondary | ICD-10-CM

## 2022-01-07 ENCOUNTER — Ambulatory Visit: Payer: Medicare Other | Admitting: Neurology

## 2022-01-12 DIAGNOSIS — G40409 Other generalized epilepsy and epileptic syndromes, not intractable, without status epilepticus: Secondary | ICD-10-CM | POA: Diagnosis not present

## 2022-01-12 DIAGNOSIS — D709 Neutropenia, unspecified: Secondary | ICD-10-CM | POA: Diagnosis not present

## 2022-01-12 DIAGNOSIS — F419 Anxiety disorder, unspecified: Secondary | ICD-10-CM | POA: Diagnosis not present

## 2022-01-12 DIAGNOSIS — Z87891 Personal history of nicotine dependence: Secondary | ICD-10-CM | POA: Diagnosis not present

## 2022-01-12 DIAGNOSIS — Z9484 Stem cells transplant status: Secondary | ICD-10-CM | POA: Diagnosis not present

## 2022-01-12 DIAGNOSIS — D473 Essential (hemorrhagic) thrombocythemia: Secondary | ICD-10-CM | POA: Diagnosis not present

## 2022-01-12 DIAGNOSIS — C9001 Multiple myeloma in remission: Secondary | ICD-10-CM | POA: Diagnosis not present

## 2022-01-12 DIAGNOSIS — Z7983 Long term (current) use of bisphosphonates: Secondary | ICD-10-CM | POA: Diagnosis not present

## 2022-01-12 DIAGNOSIS — Z79899 Other long term (current) drug therapy: Secondary | ICD-10-CM | POA: Diagnosis not present

## 2022-01-12 DIAGNOSIS — M810 Age-related osteoporosis without current pathological fracture: Secondary | ICD-10-CM | POA: Diagnosis not present

## 2022-01-12 DIAGNOSIS — R030 Elevated blood-pressure reading, without diagnosis of hypertension: Secondary | ICD-10-CM | POA: Diagnosis not present

## 2022-01-18 ENCOUNTER — Telehealth: Payer: Self-pay | Admitting: Neurology

## 2022-01-18 ENCOUNTER — Other Ambulatory Visit: Payer: Self-pay | Admitting: Neurology

## 2022-01-18 ENCOUNTER — Other Ambulatory Visit: Payer: Self-pay

## 2022-01-18 DIAGNOSIS — G40B09 Juvenile myoclonic epilepsy, not intractable, without status epilepticus: Secondary | ICD-10-CM

## 2022-01-18 MED ORDER — LEVETIRACETAM 500 MG PO TABS: 1000.0000 mg | ORAL_TABLET | Freq: Two times a day (BID) | ORAL | 1 refills | Status: DC

## 2022-01-18 NOTE — Telephone Encounter (Signed)
Patient LM with AN. Stated she needs a refill on her lexapro. Think she may be confused as to what meds she needs. ?

## 2022-01-19 ENCOUNTER — Encounter: Payer: Self-pay | Admitting: Neurology

## 2022-01-19 ENCOUNTER — Ambulatory Visit (INDEPENDENT_AMBULATORY_CARE_PROVIDER_SITE_OTHER): Payer: Medicare Other | Admitting: Neurology

## 2022-01-19 ENCOUNTER — Other Ambulatory Visit: Payer: Self-pay

## 2022-01-19 DIAGNOSIS — G40B09 Juvenile myoclonic epilepsy, not intractable, without status epilepticus: Secondary | ICD-10-CM | POA: Diagnosis not present

## 2022-01-19 MED ORDER — LEVETIRACETAM 500 MG PO TABS: ORAL_TABLET | ORAL | 3 refills | Status: DC

## 2022-01-19 NOTE — Patient Instructions (Signed)
Always good to see you. Continue Levetiracetam (Keppra) '500mg'$ : take 2 tablets twice a day. Follow-up in 1 year, call for any changes ? ? ?Seizure Precautions: ?1. If medication has been prescribed for you to prevent seizures, take it exactly as directed.  Do not stop taking the medicine without talking to your doctor first, even if you have not had a seizure in a long time.  ? ?2. Avoid activities in which a seizure would cause danger to yourself or to others.  Don't operate dangerous machinery, swim alone, or climb in high or dangerous places, such as on ladders, roofs, or girders.  Do not drive unless your doctor says you may. ? ?3. If you have any warning that you may have a seizure, lay down in a safe place where you can't hurt yourself.   ? ?4.  No driving for 6 months from last seizure, as per [facility].   Please refer to the following link on the [place] website for more information: http://www.epilepsyfoundation.org/answerplace/Social/driving/drivingu.cfm  ? ?5.  Maintain good sleep hygiene. Avoid alcohol. ? ?6.  Contact your doctor if you have any problems that may be related to the medicine you are taking. ? ?7.  Call 911 and bring the patient back to the ED if: ?      ? A.  The seizure lasts longer than 5 minutes.      ? B.  The patient doesn't awaken shortly after the seizure ? C.  The patient has new problems such as difficulty seeing, speaking or moving ? D.  The patient was injured during the seizure ? E.  The patient has a temperature over 102 F (39C) ? F.  The patient vomited and now is having trouble breathing ?      ? ?

## 2022-01-19 NOTE — Progress Notes (Signed)
? ?NEUROLOGY FOLLOW UP OFFICE NOTE ? ?Catherine Bullock ?[id number] ?01/09/1946 ? ?HISTORY OF PRESENT ILLNESS: ?I had the pleasure of seeing Catherine Bullock in follow-up in the neurology clinic on 01/19/2022.  The patient was last seen a year ago for seizures. She has a diagnosis of juvenile myoclonic epilepsy since age 76, seizures usually start with myoclonic jerks that progress to a convulsion. She had been seizure-free for over 30 years until she had 2 seizures in April 2018, then in July 2018. The first seizure occurred during medication change, the second occurred after her first dose of Melphalan a few hours prior. She was previously on Dilantin then switched to Levetiracetam 531m 2 tabs BID (10036mBID) in 2018 with no seizure recurrence since 05/2017. She denies any myoclonic jerks, no staring/unresponsive episodes, gaps in time, olfactory/gustatory hallucinations, focal numbness/tingling/weakness. No headaches, dizziness, double vision, no falls. Sleep and mood are good. No side effects on Levetiracetam. She is happy to report her multiple myeloma is in remission. ? ? ?History on Initial Assessment 01/12/2017: This is a pleasant [age] RH with a history of multiple myeloma, hypertension, juvenile myoclonic epilepsy. She had her first seizure in the 9th grade, at that time attributed to sleep deprivation and stress. She had her second seizure in 1972, and had several more until 1987. She started Dilantin in 1976 and would have breakthrough seizures due to medication compliance. She reports seizures always started with myoclonic jerks that would proceed to a convulsion. She denies any myoclonic jerks or convulsions since 1987, she has been very compliant with Dilantin 20010mn AM, 100m67m PM with no side effects. She and her husband deny any staring episodes. She recalls one time in 1987 when she missed doses of Dilantin, she was having some cognitive changes, then came to driving to her parents' house with no  recollection of events. This has not recurred since then. She denies any olfactory/gustatory hallucinations, deja vu, rising epigastric sensation, focal numbness/tingling/weakness. She has been undergoing treatment for multiple myeloma, and was noted to have pancytopenia due to interaction of lenalidomide with her Dilantin. Per records, levels have started to recover. She was [place] [address] [place], instructed to stop Dilantin and start Keppra. She has been off Dilantin for a week and taking Keppra 500mg37m with no side effects. She denies any myoclonic jerks or convulsions. She has minimized driving. She denies any headaches, dizziness, diplopia, dysarthria/dysphagia, neck/back pain, bowel/bladder dysfunction. No falls. ?  ?Epilepsy Risk Factors:  Her son started had 2 seizures as a teenager and is also taking Keppra. Otherwise she had a normal birth and early development.  There is no history of febrile convulsions, CNS infections such as meningitis/encephalitis, significant traumatic brain injury, neurosurgical procedures. ? ?Diagnostic Data: ?MRI brain with and without contrast 02/24/17 which did not show any acute changes, hippocampi symmetric with no abnormal signal or enhancement seen. ? ? ?PAST MEDICAL HISTORY: ?Past Medical History:  ?Diagnosis Date  ? Anxiety   ? Colon adenoma   ? Degenerative disk disease   ? Epilepsy ([facility]) [place] last seizure 1987  ? GERD (gastroesophageal reflux disease)   ? Hypertension   ? IgG monoclonal gammopathy   ? Iron deficiency anemia   ? Multiple myeloma ([facility]) [place]/2018  ? MVP (mitral valve prolapse)   ? Osteoporosis   ? Thrombocytosis   ? ? ?MEDICATIONS: ?Current Outpatient Medications on File Prior to Visit  ?Medication Sig Dispense Refill  ? acyclovir (ZOVIRAX) 400 MG  tablet Take 400 mg by mouth 2 (two) times daily.    ? ALPRAZolam (XANAX) 0.25 MG tablet Take 1 tablet (0.25 mg total) by mouth every 8 (eight) hours as needed. 30 tablet 1  ? aspirin EC 81 MG tablet Take 81  mg by mouth daily.    ? escitalopram (LEXAPRO) 20 MG tablet Take 20 mg by mouth daily.    ? levETIRAcetam (KEPPRA) 500 MG tablet Take 2 tablets (1,000 mg total) by mouth 2 (two) times daily. 120 tablet 1  ? Multiple Vitamin (MULTIVITAMIN) tablet Take 1 tablet by mouth daily.    ? pomalidomide (POMALYST) 1 MG capsule Take 1 mg by mouth daily. Take 1 tablet by mouth for 7 days then rest for 7 days, then repeat for a 28 day cycle    ? rosuvastatin (CRESTOR) 10 MG tablet Take 1 tablet (10 mg total) by mouth daily. 90 tablet 3  ? REVLIMID 5 MG capsule  (Patient not taking: Reported on 01/19/2022)  0  ? ?No current facility-administered medications on file prior to visit.  ? ? ?ALLERGIES: ?Allergies  ?Allergen Reactions  ? Thyroid Hormones Other (See Comments)  ? ? ?FAMILY HISTORY: ?Family History  ?Problem Relation Age of Onset  ? Nephrolithiasis Mother   ? Alzheimer's disease Mother   ? Ulcers Mother   ? Prostate cancer Father   ? Multiple myeloma Father   ? Colon cancer Neg Hx   ? ? ?SOCIAL HISTORY: ?Social History  ? ?Socioeconomic History  ? Marital status: Married  ?  Spouse name: Not on file  ? Number of children: Not on file  ? Years of education: Not on file  ? Highest education level: Not on file  ?Occupational History  ? Not on file  ?Tobacco Use  ? Smoking status: Former  ?  Types: Cigarettes  ?  Quit date: 06/21/1979  ?  Years since quitting: 42.6  ? Smokeless tobacco: Never  ?Vaping Use  ? Vaping Use: Never used  ?Substance and Sexual Activity  ? Alcohol use: Yes  ?  Alcohol/week: 2.0 standard drinks  ?  Types: 2 Glasses of wine per week  ? Drug use: No  ? Sexual activity: Yes  ?Other Topics Concern  ? Not on file  ?Social History Narrative  ? Right handed  ? Two story home  ? Drinks caffeine  ? ?Social Determinants of Health  ? ?Financial Resource Strain: Not on file  ?Food Insecurity: Not on file  ?Transportation Needs: Not on file  ?Physical Activity: Not on file  ?Stress: Not on file  ?Social Connections:  Not on file  ?Intimate Partner Violence: Not on file  ? ? ? ?PHYSICAL EXAM: ?Vitals:  ? 01/19/22 1451  ?BP: 133/77  ?Pulse: 67  ?SpO2: 99%  ? ?General: No acute distress ?Head:  Normocephalic/atraumatic ?Skin/Extremities: No rash, no edema ?Neurological Exam: alert and awake. No aphasia or dysarthria. Fund of knowledge is appropriate.  Attention and concentration are normal.   Cranial nerves: Pupils equal, round. Extraocular movements intact with no nystagmus. Visual fields full.  No facial asymmetry.  Motor: Bulk and tone normal, muscle strength 5/5 throughout with no pronator drift.   Finger to nose testing intact.  Gait narrow-based and steady, difficulty with tandem walk.  Romberg negative. ? ? ?IMPRESSION: ?This is a pleasant 76 yo RH woman with a history of juvenile myoclonic epilepsy, who had been seizure-free since 1987 on chronic Dilantin therapy, until she had 2 provoked seizures in April 2018  and July 2018. She was switched to Levetiracetam in 2018 with no further seizures since July 2018 on Levetiracetam 564m 2 tabs BID (10075mBID), refills sent. She is aware of [place] driving laws to stop driving after a seizure until 6 months seizure-free. Follow-up in 1 year, call for any changes.  ? ?Thank you for allowing me to participate in her care.  Please do not hesitate to call for any questions or concerns. ? ? ? ?KaEllouise NewerM.D. ? ? ?CC: Dr. SmTamala Julian ? ?

## 2022-01-20 ENCOUNTER — Other Ambulatory Visit: Payer: PRIVATE HEALTH INSURANCE

## 2022-02-01 DIAGNOSIS — M816 Localized osteoporosis [Lequesne]: Secondary | ICD-10-CM | POA: Diagnosis not present

## 2022-02-01 DIAGNOSIS — Z01419 Encounter for gynecological examination (general) (routine) without abnormal findings: Secondary | ICD-10-CM | POA: Diagnosis not present

## 2022-02-01 DIAGNOSIS — N958 Other specified menopausal and perimenopausal disorders: Secondary | ICD-10-CM | POA: Diagnosis not present

## 2022-02-01 DIAGNOSIS — Z6821 Body mass index (BMI) 21.0-21.9, adult: Secondary | ICD-10-CM | POA: Diagnosis not present

## 2022-02-08 DIAGNOSIS — C9001 Multiple myeloma in remission: Secondary | ICD-10-CM | POA: Diagnosis not present

## 2022-02-08 DIAGNOSIS — Z9484 Stem cells transplant status: Secondary | ICD-10-CM | POA: Diagnosis not present

## 2022-02-23 ENCOUNTER — Other Ambulatory Visit: Payer: Medicare Other | Admitting: *Deleted

## 2022-02-23 DIAGNOSIS — E782 Mixed hyperlipidemia: Secondary | ICD-10-CM | POA: Diagnosis not present

## 2022-02-23 LAB — LIPID PANEL
Chol/HDL Ratio: 2.3 ratio (ref 0.0–4.4)
Cholesterol, Total: 152 mg/dL (ref 100–199)
HDL: 65 mg/dL (ref >39)
LDL Chol Calc (NIH): 68 mg/dL (ref 0–99)
Triglycerides: 104 mg/dL (ref 0–149)
VLDL Cholesterol Cal: 19 mg/dL (ref 5–40)

## 2022-02-23 LAB — ALT: ALT: 16 IU/L (ref 0–32)

## 2022-02-26 ENCOUNTER — Ambulatory Visit: Payer: 59 | Admitting: Neurology

## 2022-03-01 DIAGNOSIS — F419 Anxiety disorder, unspecified: Secondary | ICD-10-CM | POA: Diagnosis not present

## 2022-03-01 DIAGNOSIS — C9001 Multiple myeloma in remission: Secondary | ICD-10-CM | POA: Diagnosis not present

## 2022-03-01 DIAGNOSIS — M25512 Pain in left shoulder: Secondary | ICD-10-CM | POA: Diagnosis not present

## 2022-03-01 DIAGNOSIS — E7800 Pure hypercholesterolemia, unspecified: Secondary | ICD-10-CM | POA: Diagnosis not present

## 2022-03-01 DIAGNOSIS — I10 Essential (primary) hypertension: Secondary | ICD-10-CM | POA: Diagnosis not present

## 2022-03-08 DIAGNOSIS — C9001 Multiple myeloma in remission: Secondary | ICD-10-CM | POA: Diagnosis not present

## 2022-03-08 DIAGNOSIS — Z9484 Stem cells transplant status: Secondary | ICD-10-CM | POA: Diagnosis not present

## 2022-04-06 DIAGNOSIS — M810 Age-related osteoporosis without current pathological fracture: Secondary | ICD-10-CM | POA: Diagnosis not present

## 2022-04-06 DIAGNOSIS — Z9484 Stem cells transplant status: Secondary | ICD-10-CM | POA: Diagnosis not present

## 2022-04-06 DIAGNOSIS — G629 Polyneuropathy, unspecified: Secondary | ICD-10-CM | POA: Diagnosis not present

## 2022-04-06 DIAGNOSIS — F419 Anxiety disorder, unspecified: Secondary | ICD-10-CM | POA: Diagnosis not present

## 2022-04-06 DIAGNOSIS — J209 Acute bronchitis, unspecified: Secondary | ICD-10-CM | POA: Diagnosis not present

## 2022-04-06 DIAGNOSIS — M25512 Pain in left shoulder: Secondary | ICD-10-CM | POA: Diagnosis not present

## 2022-04-06 DIAGNOSIS — Z7983 Long term (current) use of bisphosphonates: Secondary | ICD-10-CM | POA: Diagnosis not present

## 2022-04-06 DIAGNOSIS — G40409 Other generalized epilepsy and epileptic syndromes, not intractable, without status epilepticus: Secondary | ICD-10-CM | POA: Diagnosis not present

## 2022-04-06 DIAGNOSIS — C9001 Multiple myeloma in remission: Secondary | ICD-10-CM | POA: Diagnosis not present

## 2022-04-06 DIAGNOSIS — R030 Elevated blood-pressure reading, without diagnosis of hypertension: Secondary | ICD-10-CM | POA: Diagnosis not present

## 2022-04-06 DIAGNOSIS — Z79899 Other long term (current) drug therapy: Secondary | ICD-10-CM | POA: Diagnosis not present

## 2022-04-06 DIAGNOSIS — Z87891 Personal history of nicotine dependence: Secondary | ICD-10-CM | POA: Diagnosis not present

## 2022-04-06 DIAGNOSIS — D473 Essential (hemorrhagic) thrombocythemia: Secondary | ICD-10-CM | POA: Diagnosis not present

## 2022-04-11 ENCOUNTER — Emergency Department (HOSPITAL_BASED_OUTPATIENT_CLINIC_OR_DEPARTMENT_OTHER): Payer: Medicare Other

## 2022-04-11 ENCOUNTER — Other Ambulatory Visit: Payer: Self-pay

## 2022-04-11 ENCOUNTER — Emergency Department (HOSPITAL_BASED_OUTPATIENT_CLINIC_OR_DEPARTMENT_OTHER)
Admission: EM | Admit: 2022-04-11 | Discharge: 2022-04-11 | Disposition: A | Discharge status: Home / Self Care | Payer: Medicare Other | Attending: Emergency Medicine | Admitting: Emergency Medicine

## 2022-04-11 ENCOUNTER — Emergency Department (HOSPITAL_COMMUNITY): Payer: Medicare Other

## 2022-04-11 ENCOUNTER — Encounter (HOSPITAL_BASED_OUTPATIENT_CLINIC_OR_DEPARTMENT_OTHER): Payer: Self-pay | Admitting: Obstetrics and Gynecology

## 2022-04-11 DIAGNOSIS — R410 Disorientation, unspecified: Secondary | ICD-10-CM | POA: Diagnosis not present

## 2022-04-11 DIAGNOSIS — R270 Ataxia, unspecified: Secondary | ICD-10-CM | POA: Diagnosis not present

## 2022-04-11 DIAGNOSIS — G319 Degenerative disease of nervous system, unspecified: Secondary | ICD-10-CM | POA: Diagnosis not present

## 2022-04-11 DIAGNOSIS — R2689 Other abnormalities of gait and mobility: Secondary | ICD-10-CM | POA: Diagnosis not present

## 2022-04-11 DIAGNOSIS — Z79899 Other long term (current) drug therapy: Secondary | ICD-10-CM | POA: Insufficient documentation

## 2022-04-11 DIAGNOSIS — Z7982 Long term (current) use of aspirin: Secondary | ICD-10-CM | POA: Insufficient documentation

## 2022-04-11 DIAGNOSIS — R0789 Other chest pain: Secondary | ICD-10-CM | POA: Diagnosis not present

## 2022-04-11 DIAGNOSIS — R42 Dizziness and giddiness: Secondary | ICD-10-CM | POA: Diagnosis not present

## 2022-04-11 DIAGNOSIS — I10 Essential (primary) hypertension: Secondary | ICD-10-CM | POA: Diagnosis not present

## 2022-04-11 DIAGNOSIS — R269 Unspecified abnormalities of gait and mobility: Secondary | ICD-10-CM

## 2022-04-11 LAB — URINALYSIS, ROUTINE W REFLEX MICROSCOPIC
Bilirubin Urine: NEGATIVE
Glucose, UA: NEGATIVE mg/dL
Hgb urine dipstick: NEGATIVE
Ketones, ur: NEGATIVE mg/dL
Nitrite: NEGATIVE
Protein, ur: NEGATIVE mg/dL
Specific Gravity, Urine: 1.008 (ref 1.005–1.030)
pH: 6.5 (ref 5.0–8.0)

## 2022-04-11 LAB — CBC
HCT: 36.7 % (ref 36.0–46.0)
Hemoglobin: 12.2 g/dL (ref 12.0–15.0)
MCH: 31.9 pg (ref 26.0–34.0)
MCHC: 33.2 g/dL (ref 30.0–36.0)
MCV: 95.8 fL (ref 80.0–100.0)
Platelets: 174 10*3/uL (ref 150–400)
RBC: 3.83 MIL/uL — ABNORMAL LOW (ref 3.87–5.11)
RDW: 13.2 % (ref 11.5–15.5)
WBC: 4.5 10*3/uL (ref 4.0–10.5)
nRBC: 0 % (ref 0.0–0.2)

## 2022-04-11 LAB — BASIC METABOLIC PANEL
Anion gap: 11 (ref 5–15)
BUN: 20 mg/dL (ref 8–23)
CO2: 24 mmol/L (ref 22–32)
Calcium: 9.5 mg/dL (ref 8.9–10.3)
Chloride: 104 mmol/L (ref 98–111)
Creatinine, Ser: 0.94 mg/dL (ref 0.44–1.00)
GFR, Estimated: >60 mL/min (ref >60)
Glucose, Bld: 98 mg/dL (ref 70–99)
Potassium: 4.2 mmol/L (ref 3.5–5.1)
Sodium: 139 mmol/L (ref 135–145)

## 2022-04-11 LAB — RAPID URINE DRUG SCREEN, HOSP PERFORMED
Amphetamines: NOT DETECTED
Barbiturates: NOT DETECTED
Benzodiazepines: POSITIVE — AB
Cocaine: NOT DETECTED
Opiates: NOT DETECTED
Tetrahydrocannabinol: NOT DETECTED

## 2022-04-11 LAB — TROPONIN I (HIGH SENSITIVITY): Troponin I (High Sensitivity): 3 ng/L (ref <18)

## 2022-04-11 LAB — ETHANOL: Alcohol, Ethyl (B): <10 mg/dL (ref <10)

## 2022-04-11 NOTE — ED Provider Notes (Signed)
[facility] EMERGENCY DEPT Provider Note   CSN: [id number] Arrival date & time: 04/11/22  1024     History  Chief Complaint  Patient presents with   Gait Problem    Catherine Bullock is a 76 y.o. female.  Presented to the emergency room due to concern for gait issue.  Husband states yesterday patient seemed a little bit off, seem somewhat unsteady and a little bit slow yesterday during the day.  She did have good appetite and had a good meal at dinnertime.  At night patient states that she woke up in the middle the night at some point and had an episode of chest discomfort/burning sensation.  She took a dose of Xanax and went back to bed.  When she woke up this morning she is not having any ongoing chest pain.  Husband states that she likely took an additional 2 doses of the Xanax this morning.  While at church this morning patient seemed more lethargic and had a hard time walking.  Now patient is somewhat sleepy per husband.  Patient states that she feels fine right now.  She denies any ongoing chest pain.  She denies any numbness, weakness, speech or vision change.  HPI     Home Medications Prior to Admission medications   Medication Sig Start Date End Date Taking? Authorizing Provider  acyclovir (ZOVIRAX) 400 MG tablet Take 400 mg by mouth 2 (two) times daily. 10/17/20   [provider]  ALPRAZolam Duanne Moron) 0.25 MG tablet Take 1 tablet (0.25 mg total) by mouth every 8 (eight) hours as needed. 11/08/16   Curt Bears, MD  aspirin EC 81 MG tablet Take 81 mg by mouth daily. 11/29/16   [provider]  escitalopram (LEXAPRO) 20 MG tablet Take 20 mg by mouth daily.    [provider]  levETIRAcetam (KEPPRA) 500 MG tablet Take 2 tablets twice a day 01/19/22   Cameron Sprang, MD  Multiple Vitamin (MULTIVITAMIN) tablet Take 1 tablet by mouth daily.    [provider]  pomalidomide (POMALYST) 1 MG capsule Take 1 mg by mouth daily. Take 1 tablet by  mouth for 7 days then rest for 7 days, then repeat for a 28 day cycle    [provider]  REVLIMID 5 MG capsule  12/19/17   [provider]  rosuvastatin (CRESTOR) 10 MG tablet Take 1 tablet (10 mg total) by mouth daily. 10/22/21   Jerline Pain, MD      Allergies    Thyroid hormones    Review of Systems   Review of Systems  Constitutional:  Negative for chills and fever.  HENT:  Negative for ear pain and sore throat.   Eyes:  Negative for pain and visual disturbance.  Respiratory:  Negative for cough and shortness of breath.   Cardiovascular:  Negative for chest pain and palpitations.  Gastrointestinal:  Negative for abdominal pain and vomiting.  Genitourinary:  Negative for dysuria and hematuria.  Musculoskeletal:  Negative for arthralgias and back pain.  Skin:  Negative for color change and rash.  Neurological:  Negative for seizures and syncope.       Gait disturbance  All other systems reviewed and are negative.   Physical Exam Updated Vital Signs BP 130/66   Pulse (!) 48   Temp 97.7 F (36.5 C) (Oral)   Resp 17   SpO2 97%  Physical Exam Vitals and nursing note reviewed.  Constitutional:      General: She  is not in acute distress.    Appearance: She is well-developed.     Comments: Patient is alert, appears somewhat drowsy but readily arousable  HENT:     Head: Normocephalic and atraumatic.  Eyes:     Conjunctiva/sclera: Conjunctivae normal.  Cardiovascular:     Rate and Rhythm: Normal rate and regular rhythm.     Heart sounds: No murmur heard. Pulmonary:     Effort: Pulmonary effort is normal. No respiratory distress.     Breath sounds: Normal breath sounds.  Abdominal:     Palpations: Abdomen is soft.     Tenderness: There is no abdominal tenderness.  Musculoskeletal:        General: No swelling.     Cervical back: Neck supple.  Skin:    General: Skin is warm and dry.     Capillary Refill: Capillary refill takes less than 2 seconds.   Neurological:     Mental Status: She is alert.     Comments: AAOx3 CN 2-12 intact, speech clear visual fields intact 5/5 strength in b/l UE and LE Sensation to light touch intact in b/l UE and LE Ataxic gait, mildly abnormal FNF  Psychiatric:        Mood and Affect: Mood normal.     ED Results / Procedures / Treatments   Labs (all labs ordered are listed, but only abnormal results are displayed) Labs Reviewed  CBC - Abnormal; Notable for the following components:      Result Value   RBC 3.83 (*)    All other components within normal limits  URINALYSIS, ROUTINE W REFLEX MICROSCOPIC - Abnormal; Notable for the following components:   Color, Urine COLORLESS (*)    Leukocytes,Ua SMALL (*)    All other components within normal limits  RAPID URINE DRUG SCREEN, HOSP PERFORMED - Abnormal; Notable for the following components:   Benzodiazepines POSITIVE (*)    All other components within normal limits  BASIC METABOLIC PANEL  ETHANOL  TROPONIN I (HIGH SENSITIVITY)  TROPONIN I (HIGH SENSITIVITY)    EKG EKG Interpretation  Date/Time:  Sunday April 11 2022 10:51:54 EDT Ventricular Rate:  55 PR Interval:  167 QRS Duration: 104 QT Interval:  443 QTC Calculation: 424 R Axis:   76 Text Interpretation: Sinus rhythm Confirmed by Madalyn Rob [phone]) on 04/11/2022 10:55:45 AM  Radiology CT Head Wo Contrast  Result Date: 04/11/2022 CLINICAL DATA:  Ataxia.  Unsteady gait. EXAM: CT HEAD WITHOUT CONTRAST TECHNIQUE: Contiguous axial images were obtained from the base of the skull through the vertex without intravenous contrast. RADIATION DOSE REDUCTION: This exam was performed according to the departmental dose-optimization program which includes automated exposure control, adjustment of the mA and/or kV according to patient size and/or use of iterative reconstruction technique. COMPARISON:  05/02/2017. FINDINGS: Brain: No evidence of acute infarction, hemorrhage, hydrocephalus,  extra-axial collection or mass lesion/mass effect. Ventricular and sulcal enlargement reflecting mild atrophy, unchanged the prior study. Vascular: No hyperdense vessel or unexpected calcification. Skull: Normal. Negative for fracture or focal lesion. Sinuses/Orbits: Globes and orbits are unremarkable. Visualized sinuses are clear. Other: None. IMPRESSION: 1. No acute intracranial abnormalities. Electronically Signed   By: Lajean Manes M.D.   On: 04/11/2022 11:50    Procedures Procedures    Medications Ordered in ED Medications - No data to display  ED Course/ Medical Decision Making/ A&P  Medical Decision Making Amount and/or Complexity of Data Reviewed Labs: ordered. Radiology: ordered.   76 year old lady presenting for unsteadiness, sleepiness.  Patient currently in ER appears well in no distress, does appear mildly sleepy.  She did have a somewhat ataxic gait.  She also had mention an episode of chest pain last night.  Will check EKG, labs, given the unsteadiness/ataxia, will check CT head.  Ultimately suspect most likely related to the increased Xanax use over the last 24 hours.  Will further observe patient, recheck after labs and CT.  Recheck patient.  Labs are stable.  No anemia, no electrolyte derangements.  CT head is negative.  EKG without acute change, trop wnl, doubt ACS. Feel patient would benefit from MRI to rule out stroke.  Discussed case with Dr. Armandina Gemma at [facility].        Final Clinical Impression(s) / ED Diagnoses Final diagnoses:  Gait abnormality    Rx / DC Orders ED Discharge Orders     None         Lucrezia Starch, MD 04/11/22 1330

## 2022-04-11 NOTE — ED Notes (Signed)
Pt ambulated to bathroom with assistance. Pt swaying side to side when walking. Pt ambulated back to room and placed back on cardiac monitor. Pt talking on phone to sister at this time. Urine specimen sent to lab.

## 2022-04-11 NOTE — ED Provider Notes (Signed)
76 year old female arrives by private vehicle from outside hospital where she presented with concern for unsteady gait and seemingly little bit slower than usual.  It is possible patient took additional Xanax which caused her to have the symptoms however due to her presenting complaint, she was sent to this emergency room for MRI to evaluate for possible stroke. By time of exam, patient was feeling improved although states that she did not have complaints originally but listened to the concern of her friends at church and spouse. Physical Exam  BP (!) 148/74   Pulse 99   Temp 97.6 F (36.4 C) (Oral)   Resp 16   SpO2 99%   Physical Exam  Procedures  Procedures  ED Course / MDM    Medical Decision Making Amount and/or Complexity of Data Reviewed Labs: ordered. Radiology: ordered.   MRI brain is without acute findings.  Results discussed with patient and spouse who are reassured and feel ready for discharge.  Patient agrees to take her Xanax only as prescribed.       Tacy Learn, PA-C 04/11/22 Dola Factor, MD 04/12/22 [phone]

## 2022-04-11 NOTE — ED Notes (Signed)
Patient was last seen normal last night around 10:15pm. Patient was awoken at 0630 this morning and did not get out of bed until 0730 which her husband reports is unusual, difficulty finding a chapter in the bible this morning and some slurred/slower speech than usual.

## 2022-04-11 NOTE — ED Triage Notes (Signed)
Patient reports to the ER for balance issues. Patient reports this morning her husband told her she was unsteady. Patient has a hx of Multiple myeloma and epilepsy. Patient has had blood pressure fluctuations. Patient took a xanax in the middle of the night and another xanax this morning. Denies falls recently.

## 2022-04-11 NOTE — ED Notes (Signed)
Gone to MRI 

## 2022-05-11 DIAGNOSIS — Z5111 Encounter for antineoplastic chemotherapy: Secondary | ICD-10-CM | POA: Diagnosis not present

## 2022-05-11 DIAGNOSIS — C9001 Multiple myeloma in remission: Secondary | ICD-10-CM | POA: Diagnosis not present

## 2022-05-11 DIAGNOSIS — Z9484 Stem cells transplant status: Secondary | ICD-10-CM | POA: Diagnosis not present

## 2022-05-19 DIAGNOSIS — J019 Acute sinusitis, unspecified: Secondary | ICD-10-CM | POA: Diagnosis not present

## 2022-05-27 DIAGNOSIS — K1379 Other lesions of oral mucosa: Secondary | ICD-10-CM | POA: Diagnosis not present

## 2022-05-27 DIAGNOSIS — T7840XA Allergy, unspecified, initial encounter: Secondary | ICD-10-CM | POA: Diagnosis not present

## 2022-06-01 ENCOUNTER — Other Ambulatory Visit: Payer: Self-pay | Admitting: Obstetrics and Gynecology

## 2022-06-01 DIAGNOSIS — Z1231 Encounter for screening mammogram for malignant neoplasm of breast: Secondary | ICD-10-CM

## 2022-06-07 DIAGNOSIS — Z9484 Stem cells transplant status: Secondary | ICD-10-CM | POA: Diagnosis not present

## 2022-06-07 DIAGNOSIS — Z5111 Encounter for antineoplastic chemotherapy: Secondary | ICD-10-CM | POA: Diagnosis not present

## 2022-06-07 DIAGNOSIS — C9001 Multiple myeloma in remission: Secondary | ICD-10-CM | POA: Diagnosis not present

## 2022-06-15 ENCOUNTER — Ambulatory Visit
Admission: RE | Admit: 2022-06-15 | Discharge: 2022-06-15 | Disposition: A | Discharge status: Home / Self Care | Payer: Medicare Other | Source: Ambulatory Visit | Attending: Obstetrics and Gynecology | Admitting: Obstetrics and Gynecology

## 2022-06-15 DIAGNOSIS — Z1231 Encounter for screening mammogram for malignant neoplasm of breast: Secondary | ICD-10-CM | POA: Diagnosis not present

## 2022-07-06 DIAGNOSIS — C9001 Multiple myeloma in remission: Secondary | ICD-10-CM | POA: Diagnosis not present

## 2022-07-06 DIAGNOSIS — Z780 Asymptomatic menopausal state: Secondary | ICD-10-CM | POA: Diagnosis not present

## 2022-07-06 DIAGNOSIS — E559 Vitamin D deficiency, unspecified: Secondary | ICD-10-CM | POA: Diagnosis not present

## 2022-07-06 DIAGNOSIS — Z87891 Personal history of nicotine dependence: Secondary | ICD-10-CM | POA: Diagnosis not present

## 2022-07-06 DIAGNOSIS — D473 Essential (hemorrhagic) thrombocythemia: Secondary | ICD-10-CM | POA: Diagnosis not present

## 2022-07-06 DIAGNOSIS — F419 Anxiety disorder, unspecified: Secondary | ICD-10-CM | POA: Diagnosis not present

## 2022-07-06 DIAGNOSIS — Z79899 Other long term (current) drug therapy: Secondary | ICD-10-CM | POA: Diagnosis not present

## 2022-07-06 DIAGNOSIS — Z9484 Stem cells transplant status: Secondary | ICD-10-CM | POA: Diagnosis not present

## 2022-07-06 DIAGNOSIS — M8580 Other specified disorders of bone density and structure, unspecified site: Secondary | ICD-10-CM | POA: Diagnosis not present

## 2022-07-06 DIAGNOSIS — E538 Deficiency of other specified B group vitamins: Secondary | ICD-10-CM | POA: Diagnosis not present

## 2022-07-06 DIAGNOSIS — M8588 Other specified disorders of bone density and structure, other site: Secondary | ICD-10-CM | POA: Diagnosis not present

## 2022-07-06 DIAGNOSIS — Z79624 Long term (current) use of inhibitors of nucleotide synthesis: Secondary | ICD-10-CM | POA: Diagnosis not present

## 2022-07-06 DIAGNOSIS — M810 Age-related osteoporosis without current pathological fracture: Secondary | ICD-10-CM | POA: Diagnosis not present

## 2022-08-02 DIAGNOSIS — Z9484 Stem cells transplant status: Secondary | ICD-10-CM | POA: Diagnosis not present

## 2022-08-02 DIAGNOSIS — C9001 Multiple myeloma in remission: Secondary | ICD-10-CM | POA: Diagnosis not present

## 2022-08-02 DIAGNOSIS — Z5111 Encounter for antineoplastic chemotherapy: Secondary | ICD-10-CM | POA: Diagnosis not present

## 2022-08-26 DIAGNOSIS — C9001 Multiple myeloma in remission: Secondary | ICD-10-CM | POA: Diagnosis not present

## 2022-08-26 DIAGNOSIS — M8580 Other specified disorders of bone density and structure, unspecified site: Secondary | ICD-10-CM | POA: Diagnosis not present

## 2022-08-26 DIAGNOSIS — G40909 Epilepsy, unspecified, not intractable, without status epilepticus: Secondary | ICD-10-CM | POA: Diagnosis not present

## 2022-08-26 DIAGNOSIS — F419 Anxiety disorder, unspecified: Secondary | ICD-10-CM | POA: Diagnosis not present

## 2022-08-26 DIAGNOSIS — E7800 Pure hypercholesterolemia, unspecified: Secondary | ICD-10-CM | POA: Diagnosis not present

## 2022-08-26 DIAGNOSIS — C9000 Multiple myeloma not having achieved remission: Secondary | ICD-10-CM | POA: Diagnosis not present

## 2022-08-26 DIAGNOSIS — Z0000 Encounter for general adult medical examination without abnormal findings: Secondary | ICD-10-CM | POA: Diagnosis not present

## 2022-08-26 DIAGNOSIS — H109 Unspecified conjunctivitis: Secondary | ICD-10-CM | POA: Diagnosis not present

## 2022-08-26 DIAGNOSIS — Z23 Encounter for immunization: Secondary | ICD-10-CM | POA: Diagnosis not present

## 2022-08-26 DIAGNOSIS — Z1331 Encounter for screening for depression: Secondary | ICD-10-CM | POA: Diagnosis not present

## 2022-08-26 DIAGNOSIS — I10 Essential (primary) hypertension: Secondary | ICD-10-CM | POA: Diagnosis not present

## 2022-08-30 DIAGNOSIS — Z79899 Other long term (current) drug therapy: Secondary | ICD-10-CM | POA: Diagnosis not present

## 2022-08-30 DIAGNOSIS — D75839 Thrombocytosis, unspecified: Secondary | ICD-10-CM | POA: Diagnosis not present

## 2022-08-30 DIAGNOSIS — D473 Essential (hemorrhagic) thrombocythemia: Secondary | ICD-10-CM | POA: Diagnosis not present

## 2022-08-30 DIAGNOSIS — Z9484 Stem cells transplant status: Secondary | ICD-10-CM | POA: Diagnosis not present

## 2022-08-30 DIAGNOSIS — F419 Anxiety disorder, unspecified: Secondary | ICD-10-CM | POA: Diagnosis not present

## 2022-08-30 DIAGNOSIS — E559 Vitamin D deficiency, unspecified: Secondary | ICD-10-CM | POA: Diagnosis not present

## 2022-08-30 DIAGNOSIS — R569 Unspecified convulsions: Secondary | ICD-10-CM | POA: Diagnosis not present

## 2022-08-30 DIAGNOSIS — E538 Deficiency of other specified B group vitamins: Secondary | ICD-10-CM | POA: Diagnosis not present

## 2022-08-30 DIAGNOSIS — Z79624 Long term (current) use of inhibitors of nucleotide synthesis: Secondary | ICD-10-CM | POA: Diagnosis not present

## 2022-08-30 DIAGNOSIS — Z87891 Personal history of nicotine dependence: Secondary | ICD-10-CM | POA: Diagnosis not present

## 2022-08-30 DIAGNOSIS — M810 Age-related osteoporosis without current pathological fracture: Secondary | ICD-10-CM | POA: Diagnosis not present

## 2022-08-30 DIAGNOSIS — C9001 Multiple myeloma in remission: Secondary | ICD-10-CM | POA: Diagnosis not present

## 2022-08-30 DIAGNOSIS — D471 Chronic myeloproliferative disease: Secondary | ICD-10-CM | POA: Diagnosis not present

## 2022-09-14 DIAGNOSIS — L821 Other seborrheic keratosis: Secondary | ICD-10-CM | POA: Diagnosis not present

## 2022-09-14 DIAGNOSIS — L814 Other melanin hyperpigmentation: Secondary | ICD-10-CM | POA: Diagnosis not present

## 2022-09-14 DIAGNOSIS — D692 Other nonthrombocytopenic purpura: Secondary | ICD-10-CM | POA: Diagnosis not present

## 2022-09-14 DIAGNOSIS — L570 Actinic keratosis: Secondary | ICD-10-CM | POA: Diagnosis not present

## 2022-09-14 DIAGNOSIS — C44519 Basal cell carcinoma of skin of other part of trunk: Secondary | ICD-10-CM | POA: Diagnosis not present

## 2022-09-14 DIAGNOSIS — D225 Melanocytic nevi of trunk: Secondary | ICD-10-CM | POA: Diagnosis not present

## 2022-09-27 DIAGNOSIS — I10 Essential (primary) hypertension: Secondary | ICD-10-CM | POA: Diagnosis not present

## 2022-09-27 DIAGNOSIS — M810 Age-related osteoporosis without current pathological fracture: Secondary | ICD-10-CM | POA: Diagnosis not present

## 2022-09-27 DIAGNOSIS — D473 Essential (hemorrhagic) thrombocythemia: Secondary | ICD-10-CM | POA: Diagnosis not present

## 2022-09-27 DIAGNOSIS — C9001 Multiple myeloma in remission: Secondary | ICD-10-CM | POA: Diagnosis not present

## 2022-09-27 DIAGNOSIS — F419 Anxiety disorder, unspecified: Secondary | ICD-10-CM | POA: Diagnosis not present

## 2022-09-27 DIAGNOSIS — Z9484 Stem cells transplant status: Secondary | ICD-10-CM | POA: Diagnosis not present

## 2022-09-27 DIAGNOSIS — Z79899 Other long term (current) drug therapy: Secondary | ICD-10-CM | POA: Diagnosis not present

## 2022-09-27 DIAGNOSIS — Z7983 Long term (current) use of bisphosphonates: Secondary | ICD-10-CM | POA: Diagnosis not present

## 2022-09-27 DIAGNOSIS — Z87891 Personal history of nicotine dependence: Secondary | ICD-10-CM | POA: Diagnosis not present

## 2022-09-27 DIAGNOSIS — E538 Deficiency of other specified B group vitamins: Secondary | ICD-10-CM | POA: Diagnosis not present

## 2022-09-27 DIAGNOSIS — Z79624 Long term (current) use of inhibitors of nucleotide synthesis: Secondary | ICD-10-CM | POA: Diagnosis not present

## 2022-09-27 DIAGNOSIS — E559 Vitamin D deficiency, unspecified: Secondary | ICD-10-CM | POA: Diagnosis not present

## 2022-10-10 ENCOUNTER — Other Ambulatory Visit: Payer: Self-pay | Admitting: Cardiology

## 2022-10-11 ENCOUNTER — Telehealth: Payer: Self-pay | Admitting: Neurology

## 2022-10-11 NOTE — Telephone Encounter (Signed)
Pt's husband called in stating he doesn't want the patient to know he called, but the pt's memory has been horrible lately. She cannot remember where she is going to eat, when they go to the same place all the time for years. He says he does not know if it's dementia or if she is over medicated. He is very worried and she gets defensive if they say anything to her about anything.

## 2022-10-14 NOTE — Telephone Encounter (Signed)
I called to offer and appt 10/15/22 to the patient and she declined to come in. She is going to keep her March appt for now.

## 2022-10-14 NOTE — Telephone Encounter (Signed)
Pt's husband came into the office. He was very upset he has not heard anything back from our office. He is very concerned about his wife and want someone to give him a call or wants her to be seen.

## 2022-10-14 NOTE — Telephone Encounter (Signed)
[facility] staff called pt and offered her an appointment for 12/15 and she declined the appointment, they then called her husband to let him know and he was satisfied we at least tried.

## 2022-10-26 DIAGNOSIS — C9001 Multiple myeloma in remission: Secondary | ICD-10-CM | POA: Diagnosis not present

## 2022-10-26 DIAGNOSIS — Z9484 Stem cells transplant status: Secondary | ICD-10-CM | POA: Diagnosis not present

## 2022-10-26 DIAGNOSIS — Z5111 Encounter for antineoplastic chemotherapy: Secondary | ICD-10-CM | POA: Diagnosis not present

## 2022-11-04 DIAGNOSIS — Z85828 Personal history of other malignant neoplasm of skin: Secondary | ICD-10-CM | POA: Diagnosis not present

## 2022-11-04 DIAGNOSIS — C44519 Basal cell carcinoma of skin of other part of trunk: Secondary | ICD-10-CM | POA: Diagnosis not present

## 2022-11-08 DIAGNOSIS — H35372 Puckering of macula, left eye: Secondary | ICD-10-CM | POA: Diagnosis not present

## 2022-11-09 ENCOUNTER — Other Ambulatory Visit: Payer: Self-pay | Admitting: Neurology

## 2022-11-09 DIAGNOSIS — G40B09 Juvenile myoclonic epilepsy, not intractable, without status epilepticus: Secondary | ICD-10-CM

## 2022-11-11 ENCOUNTER — Other Ambulatory Visit: Payer: Self-pay | Admitting: Cardiology

## 2022-11-22 DIAGNOSIS — D75839 Thrombocytosis, unspecified: Secondary | ICD-10-CM | POA: Diagnosis not present

## 2022-11-22 DIAGNOSIS — Z79899 Other long term (current) drug therapy: Secondary | ICD-10-CM | POA: Diagnosis not present

## 2022-11-22 DIAGNOSIS — Z87891 Personal history of nicotine dependence: Secondary | ICD-10-CM | POA: Diagnosis not present

## 2022-11-22 DIAGNOSIS — E538 Deficiency of other specified B group vitamins: Secondary | ICD-10-CM | POA: Diagnosis not present

## 2022-11-22 DIAGNOSIS — H579 Unspecified disorder of eye and adnexa: Secondary | ICD-10-CM | POA: Diagnosis not present

## 2022-11-22 DIAGNOSIS — M810 Age-related osteoporosis without current pathological fracture: Secondary | ICD-10-CM | POA: Diagnosis not present

## 2022-11-22 DIAGNOSIS — R030 Elevated blood-pressure reading, without diagnosis of hypertension: Secondary | ICD-10-CM | POA: Diagnosis not present

## 2022-11-22 DIAGNOSIS — Z7983 Long term (current) use of bisphosphonates: Secondary | ICD-10-CM | POA: Diagnosis not present

## 2022-11-22 DIAGNOSIS — Z79624 Long term (current) use of inhibitors of nucleotide synthesis: Secondary | ICD-10-CM | POA: Diagnosis not present

## 2022-11-22 DIAGNOSIS — Z9484 Stem cells transplant status: Secondary | ICD-10-CM | POA: Diagnosis not present

## 2022-11-22 DIAGNOSIS — C9001 Multiple myeloma in remission: Secondary | ICD-10-CM | POA: Diagnosis not present

## 2022-11-22 DIAGNOSIS — F419 Anxiety disorder, unspecified: Secondary | ICD-10-CM | POA: Diagnosis not present

## 2022-11-22 DIAGNOSIS — D473 Essential (hemorrhagic) thrombocythemia: Secondary | ICD-10-CM | POA: Diagnosis not present

## 2022-11-22 DIAGNOSIS — E559 Vitamin D deficiency, unspecified: Secondary | ICD-10-CM | POA: Diagnosis not present

## 2022-11-22 DIAGNOSIS — R234 Changes in skin texture: Secondary | ICD-10-CM | POA: Diagnosis not present

## 2022-12-03 DIAGNOSIS — H0288B Meibomian gland dysfunction left eye, upper and lower eyelids: Secondary | ICD-10-CM | POA: Diagnosis not present

## 2022-12-03 DIAGNOSIS — H02052 Trichiasis without entropian right lower eyelid: Secondary | ICD-10-CM | POA: Diagnosis not present

## 2022-12-03 DIAGNOSIS — H0102B Squamous blepharitis left eye, upper and lower eyelids: Secondary | ICD-10-CM | POA: Diagnosis not present

## 2022-12-03 DIAGNOSIS — H5203 Hypermetropia, bilateral: Secondary | ICD-10-CM | POA: Diagnosis not present

## 2022-12-11 ENCOUNTER — Other Ambulatory Visit: Payer: Self-pay | Admitting: Cardiology

## 2022-12-20 DIAGNOSIS — Z9484 Stem cells transplant status: Secondary | ICD-10-CM | POA: Diagnosis not present

## 2022-12-20 DIAGNOSIS — Z5111 Encounter for antineoplastic chemotherapy: Secondary | ICD-10-CM | POA: Diagnosis not present

## 2022-12-20 DIAGNOSIS — C9001 Multiple myeloma in remission: Secondary | ICD-10-CM | POA: Diagnosis not present

## 2023-01-06 ENCOUNTER — Other Ambulatory Visit: Payer: Self-pay | Admitting: Cardiology

## 2023-01-07 DIAGNOSIS — H0102A Squamous blepharitis right eye, upper and lower eyelids: Secondary | ICD-10-CM | POA: Diagnosis not present

## 2023-01-07 DIAGNOSIS — H2513 Age-related nuclear cataract, bilateral: Secondary | ICD-10-CM | POA: Diagnosis not present

## 2023-01-07 DIAGNOSIS — H353132 Nonexudative age-related macular degeneration, bilateral, intermediate dry stage: Secondary | ICD-10-CM | POA: Diagnosis not present

## 2023-01-07 DIAGNOSIS — H35372 Puckering of macula, left eye: Secondary | ICD-10-CM | POA: Diagnosis not present

## 2023-01-07 DIAGNOSIS — H0102B Squamous blepharitis left eye, upper and lower eyelids: Secondary | ICD-10-CM | POA: Diagnosis not present

## 2023-01-14 ENCOUNTER — Ambulatory Visit (INDEPENDENT_AMBULATORY_CARE_PROVIDER_SITE_OTHER): Payer: Medicare Other | Admitting: Neurology

## 2023-01-14 ENCOUNTER — Encounter: Payer: Self-pay | Admitting: Neurology

## 2023-01-14 DIAGNOSIS — G40B09 Juvenile myoclonic epilepsy, not intractable, without status epilepticus: Secondary | ICD-10-CM | POA: Diagnosis not present

## 2023-01-14 MED ORDER — LEVETIRACETAM 500 MG PO TABS: ORAL_TABLET | ORAL | 3 refills | Status: DC

## 2023-01-14 NOTE — Progress Notes (Signed)
NEUROLOGY FOLLOW UP OFFICE NOTE  Catherine Bullock [id number] [birth year]  HISTORY OF PRESENT ILLNESS: I had the pleasure of seeing Catherine Bullock in follow-up in the neurology clinic on 01/14/2023.  The patient was last seen a year ago for seizures. She has a diagnosis of juvenile myoclonic epilepsy since age 77, seizures usually start with myoclonic jerks that progress to a convulsion. She had been seizure-free for over 30 years until she had 2 seizures in April 2018, then in July 2018. These appear provoked by medications. She was previously on Dilantin then switched to Levetiracetam 500mg  2 tablets BID (1000mg  BID) which she has been taking since 2018 with no further seizures. Records were reviewed. She was in the ER in 04/2022 for unsteady gait, slower than usual. Per records, she had taken a dose of Xanax in the middle of the night, then took 2 additional doses that morning. She seemed lethargic at church. By the time of exam, she felt better. MRI brain without contrast no acute changes, minimal chronic microvascular disease, mild to moderate cerebral atrophy.  She continues to deny any seizures since 2018. No staring/unresponsive episodes, gaps in time, olfactory/gustatory hallucinations, focal numbness/tingling/weakness/myoclonic jerks. No headaches, dizziness, vision changes, no falls. She recalls the ER visit in June and also agrees that she took more Xanax than prescribed. She recalls having an out of body experience. This has not recurred. She thinks her memory is "just like normal, forgets like everybody else." Her son has said he has to repeat himself. Her husband told her her memory is fine. She denies missing medications, she denies getting lost driving. Her husband manages finances and does most of the cooking. Mood is good. She sleeps well.    History on Initial Assessment 01/12/2017: This is a pleasant 77 yo RH with a history of multiple myeloma, hypertension, juvenile myoclonic epilepsy. She  had her first seizure in the 9th grade, at that time attributed to sleep deprivation and stress. She had her second seizure in 1972, and had several more until 1987. She started Dilantin in 1976 and would have breakthrough seizures due to medication compliance. She reports seizures always started with myoclonic jerks that would proceed to a convulsion. She denies any myoclonic jerks or convulsions since 1987, she has been very compliant with Dilantin 200mg  in AM, 100mg  in PM with no side effects. She and her husband deny any staring episodes. She recalls one time in 1987 when she missed doses of Dilantin, she was having some cognitive changes, then came to driving to her parents' house with no recollection of events. This has not recurred since then. She denies any olfactory/gustatory hallucinations, deja vu, rising epigastric sensation, focal numbness/tingling/weakness. She has been undergoing treatment for multiple myeloma, and was noted to have pancytopenia due to interaction of lenalidomide with her Dilantin. Per records, levels have started to recover. She was switched to [place] last week, instructed to stop Dilantin and start Keppra. She has been off Dilantin for a week and taking Keppra 500mg  BID with no side effects. She denies any myoclonic jerks or convulsions. She has minimized driving. She denies any headaches, dizziness, diplopia, dysarthria/dysphagia, neck/back pain, bowel/bladder dysfunction. No falls.   Epilepsy Risk Factors:  Her son started had 2 seizures as a teenager and is also taking Keppra. Otherwise she had a normal birth and early development.  There is no history of febrile convulsions, CNS infections such as meningitis/encephalitis, significant traumatic brain injury, neurosurgical procedures.  Diagnostic Data: MRI brain with  and without contrast 02/24/17 which did not show any acute changes, hippocampi symmetric with no abnormal signal or enhancement seen.   PAST MEDICAL  HISTORY: Past Medical History:  Diagnosis Date   Anxiety    Colon adenoma    Degenerative disk disease    Epilepsy ([place])    last seizure 1987   GERD (gastroesophageal reflux disease)    Hypertension    IgG monoclonal gammopathy    Iron deficiency anemia    Multiple myeloma ([place]) 11/08/2016   MVP (mitral valve prolapse)    Osteoporosis    Thrombocytosis     MEDICATIONS: Current Outpatient Medications on File Prior to Visit  Medication Sig Dispense Refill   acyclovir (ZOVIRAX) 400 MG tablet Take 400 mg by mouth 2 (two) times daily.     ALPRAZolam (XANAX) 0.25 MG tablet Take 1 tablet (0.25 mg total) by mouth every 8 (eight) hours as needed. 30 tablet 1   aspirin EC 81 MG tablet Take 81 mg by mouth daily.     escitalopram (LEXAPRO) 20 MG tablet Take 20 mg by mouth daily.     levETIRAcetam (KEPPRA) 500 MG tablet TAKE 2 TABLETS BY MOUTH TWICE DAILY 360 tablet 3   Multiple Vitamin (MULTIVITAMIN) tablet Take 1 tablet by mouth daily.     pomalidomide (POMALYST) 1 MG capsule Take 1 mg by mouth daily. Take 1 tablet by mouth for 7 days then rest for 7 days, then repeat for a 28 day cycle     REVLIMID 5 MG capsule  (Patient not taking: Reported on 01/19/2022)  0   rosuvastatin (CRESTOR) 10 MG tablet TAKE 1 TABLET(10 MG) BY MOUTH DAILY 15 tablet 0   No current facility-administered medications on file prior to visit.    ALLERGIES: Allergies  Allergen Reactions   Thyroid Hormones Other (See Comments)    FAMILY HISTORY: Family History  Problem Relation Age of Onset   Nephrolithiasis Mother    Alzheimer's disease Mother    Ulcers Mother    Prostate cancer Father    Multiple myeloma Father    Colon cancer Neg Hx     SOCIAL HISTORY: Social History   Socioeconomic History   Marital status: Married    Spouse name: Not on file   Number of children: Not on file   Years of education: Not on file   Highest education level: Not on file  Occupational History   Not on file  Tobacco Use    Smoking status: Former    Types: Cigarettes    Quit date: 06/21/1979    Years since quitting: 43.5   Smokeless tobacco: Never  Vaping Use   Vaping Use: Never used  Substance and Sexual Activity   Alcohol use: Not Currently    Alcohol/week: 2.0 standard drinks of alcohol    Types: 2 Glasses of wine per week   Drug use: No   Sexual activity: Yes  Other Topics Concern   Not on file  Social History Narrative   Right handed   Two story home   Drinks caffeine   Social Determinants of Health   Financial Resource Strain: Not on file  Food Insecurity: Not on file  Transportation Needs: Not on file  Physical Activity: Not on file  Stress: Not on file  Social Connections: Not on file  Intimate Partner Violence: Not on file     PHYSICAL EXAM: Vitals:   01/14/23 1356  BP: 120/68  Pulse: 68  SpO2: 98%   General: No acute  distress Head:  Normocephalic/atraumatic Skin/Extremities: No rash, no edema Neurological Exam: alert and oriented to person, place, and time. No aphasia or dysarthria. Fund of knowledge is appropriate.  Recent and remote memory are intact.  Attention and concentration are normal. MMSE 28/30    01/14/2023    2:00 PM  MMSE - Mini Mental State Exam  Orientation to time 4  Orientation to Place 5  Registration 3  Attention/ Calculation 5  Recall 3  Language- name 2 objects 2  Language- repeat 1  Language- follow 3 step command 3  Language- read & follow direction 1  Write a sentence 1  Copy design 0  Total score 28   Cranial nerves: Pupils equal, round. Extraocular movements intact with no nystagmus. Visual fields full.  No facial asymmetry.  Motor: Bulk and tone normal, muscle strength 5/5 throughout with no pronator drift.   Finger to nose testing intact.  Gait narrow-based and steady, able to tandem walk adequately.  Romberg negative.   IMPRESSION: This is a pleasant 77 yo RH woman with a history of juvenile myoclonic epilepsy, who had been  seizure-free since 1987 on chronic Dilantin therapy, until she had 2 provoked seizures in April 2018 and July 2018. She was switched to Levetiracetam in 2018 with no further seizures, no side effects on Levetiracetam 500mg  2 tabs BID (1000mg  BID), refills sent. We discussed family's memory concerns, MMSE today 28/30, she denies any difficulties with complex tasks. She is aware of [place] driving laws to stop driving after a seizure until 6 months seizure-free. Follow-up in 1 year, call for any changes.    Thank you for allowing me to participate in her care.  Please do not hesitate to call for any questions or concerns.    Ellouise Newer, M.D.   CC: Dr. Tamala Julian

## 2023-01-14 NOTE — Patient Instructions (Signed)
Always good to see you. Continue Keppra (levetiracetam) 500mg : take 2 tablets twice a day. Follow-up in 1 year, call for any changes   Seizure Precautions: 1. If medication has been prescribed for you to prevent seizures, take it exactly as directed.  Do not stop taking the medicine without talking to your doctor first, even if you have not had a seizure in a long time.   2. Avoid activities in which a seizure would cause danger to yourself or to others.  Don't operate dangerous machinery, swim alone, or climb in high or dangerous places, such as on ladders, roofs, or girders.  Do not drive unless your doctor says you may.  3. If you have any warning that you may have a seizure, lay down in a safe place where you can't hurt yourself.    4.  No driving for 6 months from last seizure, as per [facility].   Please refer to the following link on the [facility] website for more information: http://www.epilepsyfoundation.org/answerplace/Social/driving/drivingu.cfm   5.  Maintain good sleep hygiene. Avoid alcohol.  6.  Contact your doctor if you have any problems that may be related to the medicine you are taking.  7.  Call 911 and bring the patient back to the ED if:        A.  The seizure lasts longer than 5 minutes.       B.  The patient doesn't awaken shortly after the seizure  C.  The patient has new problems such as difficulty seeing, speaking or moving  D.  The patient was injured during the seizure  E.  The patient has a temperature over 102 F (39C)  F.  The patient vomited and now is having trouble breathing

## 2023-01-17 DIAGNOSIS — C9001 Multiple myeloma in remission: Secondary | ICD-10-CM | POA: Diagnosis not present

## 2023-01-17 DIAGNOSIS — Z5111 Encounter for antineoplastic chemotherapy: Secondary | ICD-10-CM | POA: Diagnosis not present

## 2023-01-21 ENCOUNTER — Other Ambulatory Visit: Payer: Self-pay | Admitting: Cardiology

## 2023-01-31 DIAGNOSIS — Z85828 Personal history of other malignant neoplasm of skin: Secondary | ICD-10-CM | POA: Diagnosis not present

## 2023-01-31 DIAGNOSIS — D485 Neoplasm of uncertain behavior of skin: Secondary | ICD-10-CM | POA: Diagnosis not present

## 2023-02-10 DIAGNOSIS — C9001 Multiple myeloma in remission: Secondary | ICD-10-CM | POA: Diagnosis not present

## 2023-02-10 DIAGNOSIS — Z79899 Other long term (current) drug therapy: Secondary | ICD-10-CM | POA: Diagnosis not present

## 2023-02-14 DIAGNOSIS — C9001 Multiple myeloma in remission: Secondary | ICD-10-CM | POA: Diagnosis not present

## 2023-02-14 DIAGNOSIS — Z5111 Encounter for antineoplastic chemotherapy: Secondary | ICD-10-CM | POA: Diagnosis not present

## 2023-02-18 DIAGNOSIS — Z7952 Long term (current) use of systemic steroids: Secondary | ICD-10-CM | POA: Diagnosis not present

## 2023-02-18 DIAGNOSIS — Z7961 Long term (current) use of immunomodulator: Secondary | ICD-10-CM | POA: Diagnosis not present

## 2023-02-18 DIAGNOSIS — Z510 Encounter for antineoplastic radiation therapy: Secondary | ICD-10-CM | POA: Diagnosis not present

## 2023-02-18 DIAGNOSIS — C9000 Multiple myeloma not having achieved remission: Secondary | ICD-10-CM | POA: Diagnosis not present

## 2023-02-18 DIAGNOSIS — Z79620 Long term (current) use of immunosuppressive biologic: Secondary | ICD-10-CM | POA: Diagnosis not present

## 2023-02-18 DIAGNOSIS — Z9484 Stem cells transplant status: Secondary | ICD-10-CM | POA: Diagnosis not present

## 2023-02-18 DIAGNOSIS — Z7983 Long term (current) use of bisphosphonates: Secondary | ICD-10-CM | POA: Diagnosis not present

## 2023-02-18 DIAGNOSIS — Z9221 Personal history of antineoplastic chemotherapy: Secondary | ICD-10-CM | POA: Diagnosis not present

## 2023-02-18 DIAGNOSIS — L989 Disorder of the skin and subcutaneous tissue, unspecified: Secondary | ICD-10-CM | POA: Diagnosis not present

## 2023-02-18 DIAGNOSIS — C9001 Multiple myeloma in remission: Secondary | ICD-10-CM | POA: Diagnosis not present

## 2023-02-21 DIAGNOSIS — C9001 Multiple myeloma in remission: Secondary | ICD-10-CM | POA: Diagnosis not present

## 2023-02-21 DIAGNOSIS — L989 Disorder of the skin and subcutaneous tissue, unspecified: Secondary | ICD-10-CM | POA: Diagnosis not present

## 2023-02-21 DIAGNOSIS — Z7961 Long term (current) use of immunomodulator: Secondary | ICD-10-CM | POA: Diagnosis not present

## 2023-02-21 DIAGNOSIS — Z79620 Long term (current) use of immunosuppressive biologic: Secondary | ICD-10-CM | POA: Diagnosis not present

## 2023-02-21 DIAGNOSIS — Z510 Encounter for antineoplastic radiation therapy: Secondary | ICD-10-CM | POA: Diagnosis not present

## 2023-02-21 DIAGNOSIS — Z9484 Stem cells transplant status: Secondary | ICD-10-CM | POA: Diagnosis not present

## 2023-02-21 DIAGNOSIS — C9000 Multiple myeloma not having achieved remission: Secondary | ICD-10-CM | POA: Diagnosis not present

## 2023-02-21 DIAGNOSIS — Z7952 Long term (current) use of systemic steroids: Secondary | ICD-10-CM | POA: Diagnosis not present

## 2023-02-21 DIAGNOSIS — Z7983 Long term (current) use of bisphosphonates: Secondary | ICD-10-CM | POA: Diagnosis not present

## 2023-02-21 DIAGNOSIS — Z9221 Personal history of antineoplastic chemotherapy: Secondary | ICD-10-CM | POA: Diagnosis not present

## 2023-02-24 DIAGNOSIS — L989 Disorder of the skin and subcutaneous tissue, unspecified: Secondary | ICD-10-CM | POA: Diagnosis not present

## 2023-02-24 DIAGNOSIS — Z510 Encounter for antineoplastic radiation therapy: Secondary | ICD-10-CM | POA: Diagnosis not present

## 2023-02-24 DIAGNOSIS — Z7952 Long term (current) use of systemic steroids: Secondary | ICD-10-CM | POA: Diagnosis not present

## 2023-02-24 DIAGNOSIS — Z79620 Long term (current) use of immunosuppressive biologic: Secondary | ICD-10-CM | POA: Diagnosis not present

## 2023-02-24 DIAGNOSIS — Z7983 Long term (current) use of bisphosphonates: Secondary | ICD-10-CM | POA: Diagnosis not present

## 2023-02-24 DIAGNOSIS — Z7961 Long term (current) use of immunomodulator: Secondary | ICD-10-CM | POA: Diagnosis not present

## 2023-02-24 DIAGNOSIS — C9001 Multiple myeloma in remission: Secondary | ICD-10-CM | POA: Diagnosis not present

## 2023-02-24 DIAGNOSIS — Z9221 Personal history of antineoplastic chemotherapy: Secondary | ICD-10-CM | POA: Diagnosis not present

## 2023-02-24 DIAGNOSIS — C9000 Multiple myeloma not having achieved remission: Secondary | ICD-10-CM | POA: Diagnosis not present

## 2023-02-24 DIAGNOSIS — Z9484 Stem cells transplant status: Secondary | ICD-10-CM | POA: Diagnosis not present

## 2023-02-25 DIAGNOSIS — C9001 Multiple myeloma in remission: Secondary | ICD-10-CM | POA: Diagnosis not present

## 2023-02-25 DIAGNOSIS — Z510 Encounter for antineoplastic radiation therapy: Secondary | ICD-10-CM | POA: Diagnosis not present

## 2023-02-25 DIAGNOSIS — Z9221 Personal history of antineoplastic chemotherapy: Secondary | ICD-10-CM | POA: Diagnosis not present

## 2023-02-25 DIAGNOSIS — Z7961 Long term (current) use of immunomodulator: Secondary | ICD-10-CM | POA: Diagnosis not present

## 2023-02-25 DIAGNOSIS — Z7952 Long term (current) use of systemic steroids: Secondary | ICD-10-CM | POA: Diagnosis not present

## 2023-02-25 DIAGNOSIS — Z9484 Stem cells transplant status: Secondary | ICD-10-CM | POA: Diagnosis not present

## 2023-02-25 DIAGNOSIS — Z79620 Long term (current) use of immunosuppressive biologic: Secondary | ICD-10-CM | POA: Diagnosis not present

## 2023-02-25 DIAGNOSIS — Z7983 Long term (current) use of bisphosphonates: Secondary | ICD-10-CM | POA: Diagnosis not present

## 2023-02-25 DIAGNOSIS — L989 Disorder of the skin and subcutaneous tissue, unspecified: Secondary | ICD-10-CM | POA: Diagnosis not present

## 2023-02-28 DIAGNOSIS — Z7983 Long term (current) use of bisphosphonates: Secondary | ICD-10-CM | POA: Diagnosis not present

## 2023-02-28 DIAGNOSIS — Z7952 Long term (current) use of systemic steroids: Secondary | ICD-10-CM | POA: Diagnosis not present

## 2023-02-28 DIAGNOSIS — Z9221 Personal history of antineoplastic chemotherapy: Secondary | ICD-10-CM | POA: Diagnosis not present

## 2023-02-28 DIAGNOSIS — L989 Disorder of the skin and subcutaneous tissue, unspecified: Secondary | ICD-10-CM | POA: Diagnosis not present

## 2023-02-28 DIAGNOSIS — Z510 Encounter for antineoplastic radiation therapy: Secondary | ICD-10-CM | POA: Diagnosis not present

## 2023-02-28 DIAGNOSIS — Z9484 Stem cells transplant status: Secondary | ICD-10-CM | POA: Diagnosis not present

## 2023-02-28 DIAGNOSIS — Z79620 Long term (current) use of immunosuppressive biologic: Secondary | ICD-10-CM | POA: Diagnosis not present

## 2023-02-28 DIAGNOSIS — Z7961 Long term (current) use of immunomodulator: Secondary | ICD-10-CM | POA: Diagnosis not present

## 2023-02-28 DIAGNOSIS — C9001 Multiple myeloma in remission: Secondary | ICD-10-CM | POA: Diagnosis not present

## 2023-03-01 DIAGNOSIS — Z7983 Long term (current) use of bisphosphonates: Secondary | ICD-10-CM | POA: Diagnosis not present

## 2023-03-01 DIAGNOSIS — L989 Disorder of the skin and subcutaneous tissue, unspecified: Secondary | ICD-10-CM | POA: Diagnosis not present

## 2023-03-01 DIAGNOSIS — C9001 Multiple myeloma in remission: Secondary | ICD-10-CM | POA: Diagnosis not present

## 2023-03-01 DIAGNOSIS — Z9484 Stem cells transplant status: Secondary | ICD-10-CM | POA: Diagnosis not present

## 2023-03-01 DIAGNOSIS — Z7952 Long term (current) use of systemic steroids: Secondary | ICD-10-CM | POA: Diagnosis not present

## 2023-03-01 DIAGNOSIS — Z510 Encounter for antineoplastic radiation therapy: Secondary | ICD-10-CM | POA: Diagnosis not present

## 2023-03-01 DIAGNOSIS — Z7961 Long term (current) use of immunomodulator: Secondary | ICD-10-CM | POA: Diagnosis not present

## 2023-03-01 DIAGNOSIS — Z9221 Personal history of antineoplastic chemotherapy: Secondary | ICD-10-CM | POA: Diagnosis not present

## 2023-03-01 DIAGNOSIS — Z79620 Long term (current) use of immunosuppressive biologic: Secondary | ICD-10-CM | POA: Diagnosis not present

## 2023-03-02 DIAGNOSIS — Z9221 Personal history of antineoplastic chemotherapy: Secondary | ICD-10-CM | POA: Diagnosis not present

## 2023-03-02 DIAGNOSIS — Z510 Encounter for antineoplastic radiation therapy: Secondary | ICD-10-CM | POA: Diagnosis not present

## 2023-03-02 DIAGNOSIS — C9001 Multiple myeloma in remission: Secondary | ICD-10-CM | POA: Diagnosis not present

## 2023-03-02 DIAGNOSIS — Z9484 Stem cells transplant status: Secondary | ICD-10-CM | POA: Diagnosis not present

## 2023-03-02 DIAGNOSIS — C9000 Multiple myeloma not having achieved remission: Secondary | ICD-10-CM | POA: Diagnosis not present

## 2023-03-03 DIAGNOSIS — R0981 Nasal congestion: Secondary | ICD-10-CM | POA: Diagnosis not present

## 2023-03-03 DIAGNOSIS — Z03818 Encounter for observation for suspected exposure to other biological agents ruled out: Secondary | ICD-10-CM | POA: Diagnosis not present

## 2023-03-03 DIAGNOSIS — R051 Acute cough: Secondary | ICD-10-CM | POA: Diagnosis not present

## 2023-03-03 DIAGNOSIS — J069 Acute upper respiratory infection, unspecified: Secondary | ICD-10-CM | POA: Diagnosis not present

## 2023-03-03 DIAGNOSIS — R509 Fever, unspecified: Secondary | ICD-10-CM | POA: Diagnosis not present

## 2023-03-10 DIAGNOSIS — R051 Acute cough: Secondary | ICD-10-CM | POA: Diagnosis not present

## 2023-03-10 DIAGNOSIS — J22 Unspecified acute lower respiratory infection: Secondary | ICD-10-CM | POA: Diagnosis not present

## 2023-03-14 DIAGNOSIS — Z1379 Encounter for other screening for genetic and chromosomal anomalies: Secondary | ICD-10-CM | POA: Diagnosis not present

## 2023-03-14 DIAGNOSIS — C9001 Multiple myeloma in remission: Secondary | ICD-10-CM | POA: Diagnosis not present

## 2023-03-14 DIAGNOSIS — C9000 Multiple myeloma not having achieved remission: Secondary | ICD-10-CM | POA: Diagnosis not present

## 2023-03-21 DIAGNOSIS — C9000 Multiple myeloma not having achieved remission: Secondary | ICD-10-CM | POA: Diagnosis not present

## 2023-03-21 DIAGNOSIS — C9001 Multiple myeloma in remission: Secondary | ICD-10-CM | POA: Diagnosis not present

## 2023-03-21 DIAGNOSIS — Z1379 Encounter for other screening for genetic and chromosomal anomalies: Secondary | ICD-10-CM | POA: Diagnosis not present

## 2023-03-31 DIAGNOSIS — Z01419 Encounter for gynecological examination (general) (routine) without abnormal findings: Secondary | ICD-10-CM | POA: Diagnosis not present

## 2023-03-31 DIAGNOSIS — Z6822 Body mass index (BMI) 22.0-22.9, adult: Secondary | ICD-10-CM | POA: Diagnosis not present

## 2023-04-06 DIAGNOSIS — C9000 Multiple myeloma not having achieved remission: Secondary | ICD-10-CM | POA: Diagnosis not present

## 2023-04-07 DIAGNOSIS — Z923 Personal history of irradiation: Secondary | ICD-10-CM | POA: Diagnosis not present

## 2023-04-07 DIAGNOSIS — C9001 Multiple myeloma in remission: Secondary | ICD-10-CM | POA: Diagnosis not present

## 2023-04-18 DIAGNOSIS — C9001 Multiple myeloma in remission: Secondary | ICD-10-CM | POA: Diagnosis not present

## 2023-04-18 DIAGNOSIS — Z5111 Encounter for antineoplastic chemotherapy: Secondary | ICD-10-CM | POA: Diagnosis not present

## 2023-05-11 DIAGNOSIS — R509 Fever, unspecified: Secondary | ICD-10-CM | POA: Diagnosis not present

## 2023-05-11 DIAGNOSIS — R051 Acute cough: Secondary | ICD-10-CM | POA: Diagnosis not present

## 2023-05-11 DIAGNOSIS — J101 Influenza due to other identified influenza virus with other respiratory manifestations: Secondary | ICD-10-CM | POA: Diagnosis not present

## 2023-05-11 DIAGNOSIS — R0981 Nasal congestion: Secondary | ICD-10-CM | POA: Diagnosis not present

## 2023-05-16 DIAGNOSIS — R5382 Chronic fatigue, unspecified: Secondary | ICD-10-CM | POA: Diagnosis not present

## 2023-05-16 DIAGNOSIS — C9000 Multiple myeloma not having achieved remission: Secondary | ICD-10-CM | POA: Diagnosis not present

## 2023-05-16 DIAGNOSIS — Z5111 Encounter for antineoplastic chemotherapy: Secondary | ICD-10-CM | POA: Diagnosis not present

## 2023-05-16 DIAGNOSIS — C9001 Multiple myeloma in remission: Secondary | ICD-10-CM | POA: Diagnosis not present

## 2023-06-06 DIAGNOSIS — C9000 Multiple myeloma not having achieved remission: Secondary | ICD-10-CM | POA: Diagnosis not present

## 2023-06-13 DIAGNOSIS — C9001 Multiple myeloma in remission: Secondary | ICD-10-CM | POA: Diagnosis not present

## 2023-06-13 DIAGNOSIS — Z5112 Encounter for antineoplastic immunotherapy: Secondary | ICD-10-CM | POA: Diagnosis not present

## 2023-06-22 DIAGNOSIS — Z483 Aftercare following surgery for neoplasm: Secondary | ICD-10-CM | POA: Diagnosis not present

## 2023-06-29 DIAGNOSIS — Z483 Aftercare following surgery for neoplasm: Secondary | ICD-10-CM | POA: Diagnosis not present

## 2023-07-11 DIAGNOSIS — Z5111 Encounter for antineoplastic chemotherapy: Secondary | ICD-10-CM | POA: Diagnosis not present

## 2023-07-11 DIAGNOSIS — C9001 Multiple myeloma in remission: Secondary | ICD-10-CM | POA: Diagnosis not present

## 2023-07-12 DIAGNOSIS — H35372 Puckering of macula, left eye: Secondary | ICD-10-CM | POA: Diagnosis not present

## 2023-07-12 DIAGNOSIS — H353132 Nonexudative age-related macular degeneration, bilateral, intermediate dry stage: Secondary | ICD-10-CM | POA: Diagnosis not present

## 2023-07-12 DIAGNOSIS — H43811 Vitreous degeneration, right eye: Secondary | ICD-10-CM | POA: Diagnosis not present

## 2023-07-12 DIAGNOSIS — H2513 Age-related nuclear cataract, bilateral: Secondary | ICD-10-CM | POA: Diagnosis not present

## 2023-07-13 DIAGNOSIS — C9000 Multiple myeloma not having achieved remission: Secondary | ICD-10-CM | POA: Diagnosis not present

## 2023-07-21 ENCOUNTER — Other Ambulatory Visit: Payer: Self-pay | Admitting: Family Medicine

## 2023-07-21 ENCOUNTER — Ambulatory Visit
Admission: RE | Admit: 2023-07-21 | Discharge: 2023-07-21 | Disposition: A | Discharge status: Home / Self Care | Payer: Medicare Other | Source: Ambulatory Visit | Attending: Family Medicine | Admitting: Family Medicine

## 2023-07-21 DIAGNOSIS — R140 Abdominal distension (gaseous): Secondary | ICD-10-CM | POA: Diagnosis not present

## 2023-07-21 DIAGNOSIS — K5900 Constipation, unspecified: Secondary | ICD-10-CM

## 2023-07-21 DIAGNOSIS — R1010 Upper abdominal pain, unspecified: Secondary | ICD-10-CM | POA: Diagnosis not present

## 2023-08-01 DIAGNOSIS — H0102B Squamous blepharitis left eye, upper and lower eyelids: Secondary | ICD-10-CM | POA: Diagnosis not present

## 2023-08-01 DIAGNOSIS — H5203 Hypermetropia, bilateral: Secondary | ICD-10-CM | POA: Diagnosis not present

## 2023-08-01 DIAGNOSIS — H35372 Puckering of macula, left eye: Secondary | ICD-10-CM | POA: Diagnosis not present

## 2023-08-01 DIAGNOSIS — H353132 Nonexudative age-related macular degeneration, bilateral, intermediate dry stage: Secondary | ICD-10-CM | POA: Diagnosis not present

## 2023-08-01 DIAGNOSIS — H2513 Age-related nuclear cataract, bilateral: Secondary | ICD-10-CM | POA: Diagnosis not present

## 2023-08-12 DIAGNOSIS — R1013 Epigastric pain: Secondary | ICD-10-CM | POA: Diagnosis not present

## 2023-08-22 DIAGNOSIS — C9001 Multiple myeloma in remission: Secondary | ICD-10-CM | POA: Diagnosis not present

## 2023-08-22 DIAGNOSIS — G40B09 Juvenile myoclonic epilepsy, not intractable, without status epilepticus: Secondary | ICD-10-CM | POA: Diagnosis not present

## 2023-08-22 DIAGNOSIS — D473 Essential (hemorrhagic) thrombocythemia: Secondary | ICD-10-CM | POA: Diagnosis not present

## 2023-08-22 DIAGNOSIS — F419 Anxiety disorder, unspecified: Secondary | ICD-10-CM | POA: Diagnosis not present

## 2023-08-22 DIAGNOSIS — D709 Neutropenia, unspecified: Secondary | ICD-10-CM | POA: Diagnosis not present

## 2023-08-22 DIAGNOSIS — Z5112 Encounter for antineoplastic immunotherapy: Secondary | ICD-10-CM | POA: Diagnosis not present

## 2023-09-19 DIAGNOSIS — D709 Neutropenia, unspecified: Secondary | ICD-10-CM | POA: Diagnosis not present

## 2023-09-19 DIAGNOSIS — D75839 Thrombocytosis, unspecified: Secondary | ICD-10-CM | POA: Diagnosis not present

## 2023-09-19 DIAGNOSIS — Z5112 Encounter for antineoplastic immunotherapy: Secondary | ICD-10-CM | POA: Diagnosis not present

## 2023-09-19 DIAGNOSIS — F419 Anxiety disorder, unspecified: Secondary | ICD-10-CM | POA: Diagnosis not present

## 2023-09-19 DIAGNOSIS — C9001 Multiple myeloma in remission: Secondary | ICD-10-CM | POA: Diagnosis not present

## 2023-10-04 DIAGNOSIS — D720 Genetic anomalies of leukocytes: Secondary | ICD-10-CM | POA: Diagnosis not present

## 2023-10-04 DIAGNOSIS — C9002 Multiple myeloma in relapse: Secondary | ICD-10-CM | POA: Diagnosis not present

## 2023-10-04 DIAGNOSIS — Z006 Encounter for examination for normal comparison and control in clinical research program: Secondary | ICD-10-CM | POA: Diagnosis not present

## 2023-10-04 DIAGNOSIS — M818 Other osteoporosis without current pathological fracture: Secondary | ICD-10-CM | POA: Diagnosis not present

## 2023-10-04 DIAGNOSIS — C9001 Multiple myeloma in remission: Secondary | ICD-10-CM | POA: Diagnosis not present

## 2023-10-04 DIAGNOSIS — F419 Anxiety disorder, unspecified: Secondary | ICD-10-CM | POA: Diagnosis not present

## 2023-10-04 DIAGNOSIS — R413 Other amnesia: Secondary | ICD-10-CM | POA: Diagnosis not present

## 2023-10-04 DIAGNOSIS — E559 Vitamin D deficiency, unspecified: Secondary | ICD-10-CM | POA: Diagnosis not present

## 2023-10-10 DIAGNOSIS — R4189 Other symptoms and signs involving cognitive functions and awareness: Secondary | ICD-10-CM | POA: Diagnosis not present

## 2023-10-10 DIAGNOSIS — Z006 Encounter for examination for normal comparison and control in clinical research program: Secondary | ICD-10-CM | POA: Diagnosis not present

## 2023-10-10 DIAGNOSIS — Z0189 Encounter for other specified special examinations: Secondary | ICD-10-CM | POA: Diagnosis not present

## 2023-10-10 DIAGNOSIS — C9002 Multiple myeloma in relapse: Secondary | ICD-10-CM | POA: Diagnosis not present

## 2023-10-12 DIAGNOSIS — C9000 Multiple myeloma not having achieved remission: Secondary | ICD-10-CM | POA: Diagnosis not present

## 2023-10-18 DIAGNOSIS — C9000 Multiple myeloma not having achieved remission: Secondary | ICD-10-CM | POA: Diagnosis not present

## 2023-10-18 DIAGNOSIS — E538 Deficiency of other specified B group vitamins: Secondary | ICD-10-CM | POA: Diagnosis not present

## 2023-10-18 DIAGNOSIS — Z006 Encounter for examination for normal comparison and control in clinical research program: Secondary | ICD-10-CM | POA: Diagnosis not present

## 2023-10-19 DIAGNOSIS — M79662 Pain in left lower leg: Secondary | ICD-10-CM | POA: Diagnosis not present

## 2023-10-24 DIAGNOSIS — Z006 Encounter for examination for normal comparison and control in clinical research program: Secondary | ICD-10-CM | POA: Diagnosis not present

## 2023-10-31 DIAGNOSIS — R0781 Pleurodynia: Secondary | ICD-10-CM | POA: Diagnosis not present

## 2023-10-31 DIAGNOSIS — C9002 Multiple myeloma in relapse: Secondary | ICD-10-CM | POA: Diagnosis not present

## 2023-10-31 DIAGNOSIS — R059 Cough, unspecified: Secondary | ICD-10-CM | POA: Diagnosis not present

## 2023-11-01 DIAGNOSIS — D696 Thrombocytopenia, unspecified: Secondary | ICD-10-CM | POA: Diagnosis not present

## 2023-11-01 DIAGNOSIS — I10 Essential (primary) hypertension: Secondary | ICD-10-CM | POA: Diagnosis not present

## 2023-11-01 DIAGNOSIS — R413 Other amnesia: Secondary | ICD-10-CM | POA: Diagnosis not present

## 2023-11-01 DIAGNOSIS — Z0000 Encounter for general adult medical examination without abnormal findings: Secondary | ICD-10-CM | POA: Diagnosis not present

## 2023-11-01 DIAGNOSIS — C9000 Multiple myeloma not having achieved remission: Secondary | ICD-10-CM | POA: Diagnosis not present

## 2023-11-01 DIAGNOSIS — E7800 Pure hypercholesterolemia, unspecified: Secondary | ICD-10-CM | POA: Diagnosis not present

## 2023-11-01 DIAGNOSIS — Z1331 Encounter for screening for depression: Secondary | ICD-10-CM | POA: Diagnosis not present

## 2023-11-01 DIAGNOSIS — F419 Anxiety disorder, unspecified: Secondary | ICD-10-CM | POA: Diagnosis not present

## 2023-11-01 DIAGNOSIS — G40909 Epilepsy, unspecified, not intractable, without status epilepticus: Secondary | ICD-10-CM | POA: Diagnosis not present

## 2023-11-08 DIAGNOSIS — Z006 Encounter for examination for normal comparison and control in clinical research program: Secondary | ICD-10-CM | POA: Diagnosis not present

## 2023-11-08 DIAGNOSIS — C9002 Multiple myeloma in relapse: Secondary | ICD-10-CM | POA: Diagnosis not present

## 2023-11-09 DIAGNOSIS — C9002 Multiple myeloma in relapse: Secondary | ICD-10-CM | POA: Diagnosis not present

## 2023-11-10 DIAGNOSIS — C9002 Multiple myeloma in relapse: Secondary | ICD-10-CM | POA: Diagnosis not present

## 2023-11-15 DIAGNOSIS — C9001 Multiple myeloma in remission: Secondary | ICD-10-CM | POA: Diagnosis not present

## 2023-11-15 DIAGNOSIS — Z5111 Encounter for antineoplastic chemotherapy: Secondary | ICD-10-CM | POA: Diagnosis not present

## 2023-11-15 DIAGNOSIS — Z006 Encounter for examination for normal comparison and control in clinical research program: Secondary | ICD-10-CM | POA: Diagnosis not present

## 2023-12-08 DIAGNOSIS — Z006 Encounter for examination for normal comparison and control in clinical research program: Secondary | ICD-10-CM | POA: Diagnosis not present

## 2023-12-08 DIAGNOSIS — C9002 Multiple myeloma in relapse: Secondary | ICD-10-CM | POA: Diagnosis not present

## 2023-12-08 DIAGNOSIS — C9001 Multiple myeloma in remission: Secondary | ICD-10-CM | POA: Diagnosis not present

## 2023-12-08 DIAGNOSIS — Z5111 Encounter for antineoplastic chemotherapy: Secondary | ICD-10-CM | POA: Diagnosis not present

## 2023-12-13 DIAGNOSIS — C9002 Multiple myeloma in relapse: Secondary | ICD-10-CM | POA: Diagnosis not present

## 2023-12-13 DIAGNOSIS — Z006 Encounter for examination for normal comparison and control in clinical research program: Secondary | ICD-10-CM | POA: Diagnosis not present

## 2023-12-13 DIAGNOSIS — C9001 Multiple myeloma in remission: Secondary | ICD-10-CM | POA: Diagnosis not present

## 2023-12-13 DIAGNOSIS — Z5111 Encounter for antineoplastic chemotherapy: Secondary | ICD-10-CM | POA: Diagnosis not present

## 2023-12-28 DIAGNOSIS — Z23 Encounter for immunization: Secondary | ICD-10-CM | POA: Diagnosis not present

## 2024-01-02 ENCOUNTER — Other Ambulatory Visit: Payer: Self-pay | Admitting: Neurology

## 2024-01-02 DIAGNOSIS — G40B09 Juvenile myoclonic epilepsy, not intractable, without status epilepticus: Secondary | ICD-10-CM

## 2024-01-04 ENCOUNTER — Encounter: Payer: Self-pay | Admitting: Neurology

## 2024-01-04 ENCOUNTER — Ambulatory Visit (INDEPENDENT_AMBULATORY_CARE_PROVIDER_SITE_OTHER): Payer: Medicare Other | Admitting: Neurology

## 2024-01-04 DIAGNOSIS — G40B09 Juvenile myoclonic epilepsy, not intractable, without status epilepticus: Secondary | ICD-10-CM | POA: Diagnosis not present

## 2024-01-04 MED ORDER — LEVETIRACETAM 500 MG PO TABS: 1000.0000 mg | ORAL_TABLET | Freq: Two times a day (BID) | ORAL | 4 refills | Status: AC

## 2024-01-04 NOTE — Patient Instructions (Signed)
 Good to see you!  Continue Levetiracetam (Keppra) 500mg : take 2 tablets twice a day  2. Proceed with Neurocognitive evaluation as scheduled  3. Discuss Duloxetine and anxiety with Dr. Katrinka Blazing  4. Follow-up in 1 year or earlier if needed, call for any changes   Seizure Precautions: 1. If medication has been prescribed for you to prevent seizures, take it exactly as directed.  Do not stop taking the medicine without talking to your doctor first, even if you have not had a seizure in a long time.   2. Avoid activities in which a seizure would cause danger to yourself or to others.  Don't operate dangerous machinery, swim alone, or climb in high or dangerous places, such as on ladders, roofs, or girders.  Do not drive unless your doctor says you may.  3. If you have any warning that you may have a seizure, lay down in a safe place where you can't hurt yourself.    4.  No driving for 6 months from last seizure, as per [facility].   Please refer to the following link on the Epilepsy Foundation of America's website for more information: http://www.epilepsyfoundation.org/answerplace/Social/driving/drivingu.cfm   5.  Maintain good sleep hygiene. Avoid alcohol.  6.  Contact your doctor if you have any problems that may be related to the medicine you are taking.  7.  Call 911 and bring the patient back to the ED if:        A.  The seizure lasts longer than 5 minutes.       B.  The patient doesn't awaken shortly after the seizure  C.  The patient has new problems such as difficulty seeing, speaking or moving  D.  The patient was injured during the seizure  E.  The patient has a temperature over 102 F (39C)  F.  The patient vomited and now is having trouble breathing

## 2024-01-04 NOTE — Progress Notes (Signed)
 NEUROLOGY FOLLOW UP OFFICE NOTE  Catherine Bullock [id number] 1946/06/30  HISTORY OF PRESENT ILLNESS: I had the pleasure of seeing Catherine Bullock in follow-up in the neurology clinic on 01/04/2024.  The patient was last seen on a year ago for seizures. She has a diagnosis of juvenile myoclonic epilepsy since age 78, seizures usually start with myoclonic jerks that progress to a convulsion. She had been seizure-free for over 30 years until she had 2 seizures in April 2018, then in July 2018. These appear provoked by medications. She was previously on Dilantin then switched to Levetiracetam 500mg  2 tabs BID (1000mg  BID) with no further seizures since 2018. She denies any staring/unresponsive episodes, gaps in time, olfactory/gustatory hallucinations, focal numbness/tingling/weakness, myoclonic jerks. No headaches, dizziness, vision changes, no falls.   Her family had called our office concerned about her memory, MMSE 28/30 in 12/2022. She is followed at [facility] for IgD lambda myeloma, and is on treatment with [id number]. 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 It was noted recent MoCA was 15/30 but she was highly anxious during testing. After treatment, she wrote a sentence but some letters looked different than before and she got the date wrong initially. Head CT no acute changes. EEG done showed mild nonspecific slowing of the backtround, rare activation of high amplitude generalized spike polyspike and wave discharges lasting less than a second. They both report that her son is the one concerned the most ("he is pressing me hard"), she reports he is concerned because he would say something then an hour later she asks again. She denies missing medications, her husband agrees. Her husband manages finances and meals. She drives and denies getting lost driving, except for one instance 3-4 months ago where she could not find her  doctor's office. She told her husband she went to a walk-in clinic instead. She states it was a different place, her husband reminds her the office was in the same building, just a different floor. She is independent with dressing and bathing. Her mother had Alzheimer's disease. She reports mood is good. Her husband reminds her to ask about the alprazolam. She has told her husband she does not think the Duloxetine is helping her much, she gets extremely nervous when talking about some things. She states "I'm not nervous or depressed," her husband says "it gets under your skin." She states she takes the alprazolam rarely.    History on Initial Assessment 01/12/2017: This is a pleasant 78 yo RH with a history of multiple myeloma, hypertension, juvenile myoclonic epilepsy. She had her first seizure in the 9th grade, at that time attributed to sleep deprivation and stress. She had her second seizure in 1972, and had several more until 1987. She started Dilantin in 1976 and would have breakthrough seizures due to medication compliance. She reports seizures always started with myoclonic jerks that would proceed to a convulsion. She denies any myoclonic jerks or convulsions since 1987, she has been very compliant with Dilantin 200mg  in AM, 100mg  in PM with no side effects. She and her husband deny any staring episodes. She recalls one time in 1987 when she missed doses of Dilantin, she was having some cognitive changes, then came to driving to her parents' house with no recollection of events. This has not recurred since then. She denies any olfactory/gustatory hallucinations, deja vu, rising epigastric sensation, focal numbness/tingling/weakness. She has been undergoing treatment for multiple myeloma, and was noted  to have pancytopenia due to interaction of lenalidomide with her Dilantin. Per records, levels have started to recover. She was switched to Keppra last week, instructed to stop Dilantin and start Keppra. She  has been off Dilantin for a week and taking Keppra 500mg  BID with no side effects. She denies any myoclonic jerks or convulsions. She has minimized driving. She denies any headaches, dizziness, diplopia, dysarthria/dysphagia, neck/back pain, bowel/bladder dysfunction. No falls.   Epilepsy Risk Factors:  Her son started had 2 seizures as a teenager and is also taking Keppra. Otherwise she had a normal birth and early development.  There is no history of febrile convulsions, CNS infections such as meningitis/encephalitis, significant traumatic brain injury, neurosurgical procedures.  Diagnostic Data: MRI brain with and without contrast 02/24/17 which did not show any acute changes, hippocampi symmetric with no abnormal signal or enhancement seen. EEG at [facility] 10/2023: mild nonspecific slowing of the backtround, rare activation of high amplitude generalized spike polyspike and wave discharges lasting less than a second.  PAST MEDICAL HISTORY: Past Medical History:  Diagnosis Date   Anxiety    Colon adenoma    Degenerative disk disease    Epilepsy (HCC)    last seizure 1987   GERD (gastroesophageal reflux disease)    Hypertension    IgG monoclonal gammopathy    Iron deficiency anemia    Multiple myeloma (HCC) 11/08/2016   MVP (mitral valve prolapse)    Osteoporosis    Thrombocytosis     MEDICATIONS: Current Outpatient Medications on File Prior to Visit  Medication Sig Dispense Refill   acyclovir (ZOVIRAX) 400 MG tablet Take 400 mg by mouth 2 (two) times daily.     ALPRAZolam (XANAX) 0.25 MG tablet Take 1 tablet (0.25 mg total) by mouth every 8 (eight) hours as needed. 30 tablet 1   aspirin EC 81 MG tablet Take 81 mg by mouth daily.     escitalopram (LEXAPRO) 20 MG tablet Take 20 mg by mouth daily.     levETIRAcetam (KEPPRA) 500 MG tablet TAKE 2 TABLETS BY MOUTH TWICE DAILY 120 tablet 0   Multiple Vitamin (MULTIVITAMIN) tablet Take 1 tablet by mouth daily.     pomalidomide (POMALYST) 1  MG capsule Take 1 mg by mouth daily. Take 1 tablet by mouth for 7 days then rest for 7 days, then repeat for a 28 day cycle     rosuvastatin (CRESTOR) 10 MG tablet TAKE 1 TABLET(10 MG) BY MOUTH DAILY 15 tablet 0   No current facility-administered medications on file prior to visit.    ALLERGIES: Allergies  Allergen Reactions   Thyroid Hormones Other (See Comments)    FAMILY HISTORY: Family History  Problem Relation Age of Onset   Nephrolithiasis Mother    Alzheimer's disease Mother    Ulcers Mother    Prostate cancer Father    Multiple myeloma Father    Colon cancer Neg Hx     SOCIAL HISTORY: Social History   Socioeconomic History   Marital status: Married    Spouse name: Not on file   Number of children: Not on file   Years of education: Not on file   Highest education level: Not on file  Occupational History   Not on file  Tobacco Use   Smoking status: Former    Current packs/day: 0.00    Types: Cigarettes    Quit date: 06/21/1979    Years since quitting: 44.5   Smokeless tobacco: Never  Vaping Use   Vaping status:  Never Used  Substance and Sexual Activity   Alcohol use: Not Currently    Alcohol/week: 2.0 standard drinks of alcohol    Types: 2 Glasses of wine per week   Drug use: No   Sexual activity: Yes  Other Topics Concern   Not on file  Social History Narrative   Right handed   Two story home   Drinks caffeine   Social Drivers of Health   Financial Resource Strain: Not on file  Food Insecurity: Low Risk  (10/18/2023)   Received from [facility]   Hunger Vital Sign    Worried About Running Out of Food in the Last Year: Never true    Ran Out of Food in the Last Year: Never true  Transportation Needs: No Transportation Needs (10/18/2023)   Received from [facility]    In the past 12 months, has lack of reliable transportation kept you from medical appointments, meetings, work or from getting things needed for daily living? :  No  Physical Activity: Not on file  Stress: Not on file  Social Connections: Not on file  Intimate Partner Violence: Not on file     PHYSICAL EXAM: Vitals:   01/04/24 1005  BP: 126/79  Pulse: 96  SpO2: 98%   General: No acute distress Head:  Normocephalic/atraumatic Skin/Extremities: No rash, no edema Neurological Exam: alert and awake. No aphasia or dysarthria. Fund of knowledge is appropriate. Attention and concentration are normal.   Cranial nerves: Pupils equal, round. Extraocular movements intact with no nystagmus. Visual fields full.  No facial asymmetry.  Motor: Bulk and tone normal, muscle strength 5/5 throughout with no pronator drift.   Finger to nose testing intact.  Gait narrow-based and steady, able to tandem walk adequately.  Romberg negative.   IMPRESSION: This is a pleasant 78 yo RH woman with a history of Juvenile myoclonic epilepsy, who had been seizure-free since 1987 on chronic Dilantin therapy, until she had 2 provoked seizures in April 2018 and July 2018. She was switched to Levetiracetam in 2018 with no further seizures, no side effects on Levetiracetam 1000mg  BID, refills sent. Family has expressed concern about her memory, there was one instance of mild confusion after myeloma treatment in December. She is scheduled for Neurocognitive testing later this month, further recommendations depending on results. She is aware of [place] driving laws to stop driving after a seizure until 6 months seizure-free. Discuss Duloxetine/Xanax concerns with PCP. Follow-up in 1 year, call for any changes.   Thank you for allowing me to participate in her care.  Please do not hesitate to call for any questions or concerns.    Patrcia Dolly, M.D.   CC: Dr. Katrinka Blazing

## 2024-01-10 DIAGNOSIS — Z5111 Encounter for antineoplastic chemotherapy: Secondary | ICD-10-CM | POA: Diagnosis not present

## 2024-01-10 DIAGNOSIS — C9002 Multiple myeloma in relapse: Secondary | ICD-10-CM | POA: Diagnosis not present

## 2024-01-10 DIAGNOSIS — Z006 Encounter for examination for normal comparison and control in clinical research program: Secondary | ICD-10-CM | POA: Diagnosis not present

## 2024-01-10 DIAGNOSIS — C9001 Multiple myeloma in remission: Secondary | ICD-10-CM | POA: Diagnosis not present

## 2024-01-18 DIAGNOSIS — C9000 Multiple myeloma not having achieved remission: Secondary | ICD-10-CM | POA: Diagnosis not present

## 2024-01-18 DIAGNOSIS — D72820 Lymphocytosis (symptomatic): Secondary | ICD-10-CM | POA: Diagnosis not present

## 2024-01-18 DIAGNOSIS — D649 Anemia, unspecified: Secondary | ICD-10-CM | POA: Diagnosis not present

## 2024-01-18 DIAGNOSIS — C9001 Multiple myeloma in remission: Secondary | ICD-10-CM | POA: Diagnosis not present

## 2024-01-18 DIAGNOSIS — Z006 Encounter for examination for normal comparison and control in clinical research program: Secondary | ICD-10-CM | POA: Diagnosis not present

## 2024-01-18 DIAGNOSIS — S2241XD Multiple fractures of ribs, right side, subsequent encounter for fracture with routine healing: Secondary | ICD-10-CM | POA: Diagnosis not present

## 2024-01-20 DIAGNOSIS — C9002 Multiple myeloma in relapse: Secondary | ICD-10-CM | POA: Diagnosis not present

## 2024-01-20 DIAGNOSIS — C9001 Multiple myeloma in remission: Secondary | ICD-10-CM | POA: Diagnosis not present

## 2024-01-20 DIAGNOSIS — Z5111 Encounter for antineoplastic chemotherapy: Secondary | ICD-10-CM | POA: Diagnosis not present

## 2024-01-20 DIAGNOSIS — Z006 Encounter for examination for normal comparison and control in clinical research program: Secondary | ICD-10-CM | POA: Diagnosis not present

## 2024-01-25 ENCOUNTER — Encounter: Payer: Self-pay | Admitting: Psychology

## 2024-01-25 DIAGNOSIS — F329 Major depressive disorder, single episode, unspecified: Secondary | ICD-10-CM | POA: Insufficient documentation

## 2024-01-26 ENCOUNTER — Ambulatory Visit (INDEPENDENT_AMBULATORY_CARE_PROVIDER_SITE_OTHER): Payer: PRIVATE HEALTH INSURANCE | Admitting: Psychology

## 2024-01-26 ENCOUNTER — Ambulatory Visit: Payer: Self-pay

## 2024-01-26 ENCOUNTER — Encounter: Payer: Self-pay | Admitting: Psychology

## 2024-01-26 DIAGNOSIS — F0670 Mild neurocognitive disorder due to known physiological condition without behavioral disturbance: Secondary | ICD-10-CM

## 2024-01-26 DIAGNOSIS — R4189 Other symptoms and signs involving cognitive functions and awareness: Secondary | ICD-10-CM

## 2024-01-26 HISTORY — DX: Mild neurocognitive disorder due to known physiological condition without behavioral disturbance: F06.70

## 2024-01-26 NOTE — Progress Notes (Signed)
   Psychometrician Note   Cognitive testing was administered to Catherine Bullock by Shan Levans, B.S. (psychometrist) under the supervision of Dr. Newman Nickels, Ph.D., ABPP, licensed psychologist on 01/26/2024. [name] did not appear overtly distressed by the testing session per behavioral observation or responses across self-report questionnaires. Rest breaks were offered.    The battery of tests administered was selected by Dr. Newman Nickels, Ph.D., ABPP with consideration to [name]'s current level of functioning, the nature of her symptoms, emotional and behavioral responses during interview, level of literacy, observed level of motivation/effort, and the nature of the referral question. This battery was communicated to the psychometrist. Communication between Dr. Newman Nickels, Ph.D., ABPP and the psychometrist was ongoing throughout the evaluation and Dr. Newman Nickels, Ph.D., ABPP was immediately accessible at all times. Dr. Newman Nickels, Ph.D., ABPP provided supervision to the psychometrist on the date of this service to the extent necessary to assure the quality of all services provided.    Catherine Bullock will return within approximately 1-2 weeks for an interactive feedback session with Dr. Milbert Coulter at which time her test performances, clinical impressions, and treatment recommendations will be reviewed in detail. Ms. Abramo understands she can contact our office should she require our assistance before this time.  A total of 145 minutes of billable time were spent face-to-face with [name] by the psychometrist. This includes both test administration and scoring time. Billing for these services is reflected in the clinical report generated by Dr. Newman Nickels, Ph.D., ABPP  This note reflects time spent with the psychometrician and does not include test scores or any clinical interpretations made by Dr. Milbert Coulter. The full report will follow in a separate note.

## 2024-01-26 NOTE — Progress Notes (Signed)
 NEUROPSYCHOLOGICAL EVALUATION [place] [place] Department of Neurology  Date of Evaluation: January 26, 2024  Reason for Referral:   Catherine Bullock is a 78 y.o. right-handed Caucasian female referred by Patrcia Dolly, M.D., to characterize her current cognitive functioning and assist with diagnostic clarity and treatment planning in the context of subjective cognitive decline.   Assessment and Plan:   Clinical Impression(s): Catherine Bullock's pattern of performance is suggestive of prominent impairments surrounding visuospatial abilities, as well as both encoding (i.e., learning) and delayed retrieval aspects of memory. While variable, performances across processing speed and executive functioning also exhibited notable impairment across nearly all related tasks. Variability was further exhibited across verbal fluency and recognition/consolidation aspects of memory. Performances were appropriate relative to age-matched peers across attention/concentration, safety/judgment, receptive language, and confrontation naming. Functionally, Catherine Bullock and her husband described a good degree of sustained independence. As such, given evidence for cognitive dysfunction described above, she meets criteria for a Mild Neurocognitive Disorder ("mild cognitive impairment") at the present time.  The cause for cognitive impairment remains uncertain. During testing, outward anxiety was extremely apparent. This manifested in whole body tremulous experiences that persisted from the start of testing until the very end. Of note, Catherine Bullock was observed having a conversation with an employee immediately after leaving the testing room where her affect and presentation was calm, collected, and wholly unremarkable. It remains unclear how this degree of acute testing anxiety impacted obtained performances. However, it is worth highlighting that while a brain MRI in 2018 was unremarkable, a more recent MRI in  2023 suggested mild to moderate generalized atrophy. This would suggest age advanced cerebral atrophy approaching moderate severity occurring in only a 5-year time frame. This does elevate concerns for an underlying neurological process and certainly would not be caused by anxiety-based distress.  Catherine Bullock's current testing pattern is fairly non-specific in nature. Cognitive dysfunction surrounding executive dysfunction and memory impairment is common in individuals with a history of juvenile myoclonic epilepsy (JME). Some of the current findings could represent longstanding weaknesses relating back to this condition before it was effectively managed. However, this cannot be stated with certainty as there is no testing available for comparison purposes. Her current multiple myeloma medication infusion (gamunex-c/gammaked) is generally not associated with significant cognitive dysfunction. Anti-epileptic medications are known to create generally mild cognitive cognitive dysfunction in some cases however. Prominent visuospatial impairment, executive dysfunction, and memory weakness can be seen in various parkinsonian conditions such as Lewy body disease or corticobasal degeneration. However, she does not exhibit any behavioral features of these illnesses outside of tremors which would appear to be greatly impacted by acute anxiety. Her pattern of deficits could reasonably be seen in a vascular/medical presentation. However, both her prior brain MRIs suggested minimal microvascular disease and no prior infarctions, making this less likely. Underlying Alzheimer's disease cannot be ruled out, especially as retention rates ranged from 0% to 43% across memory testing. However, she did benefit from cueing across some recognition trials and confrontation naming was normatively appropriately. While this illness cannot be ruled out, it also cannot be ruled in with confidence at the present time. Overall, more firm  diagnostic clarity cannot be obtained presently. Time will likely need to pass to see if there is further evolution of cognitive impairment and a cognitive/behavioral pattern becomes more easily discernible. Continued medical monitoring will be important moving forward.    Recommendations: A repeat neuropsychological evaluation in 12-24 months is recommended to assess the  trajectory of future cognitive decline should it occur. This will also aid in future efforts towards improved diagnostic clarity.  Performance across neurocognitive testing is not a strong predictor of an individual's safety operating a motor vehicle. With that being said, prominent visuospatial deficits and executive dysfunction certainly raises ongoing concerns. It may be prudent for her and her family to pursue a formalized driving evaluation. If they desire to do this, they could reach out to the following agencies: [employer] in [place]: [phone] [facility]: [phone] [facility]: [phone] [facility]: [phone] or [phone]  Should there be progression of current deficits over time, Catherine Bullock is unlikely to regain any independent living skills lost. Therefore, it is recommended that she remain as involved as possible in all aspects of household chores, finances, and medication management, with supervision to ensure adequate performance. She will likely benefit from the establishment and maintenance of a routine in order to maximize her functional abilities over time.  It will be important for Catherine Bullock to have another person with her when in situations where she may need to process information, weigh the pros and cons of different options, and make decisions, in order to ensure that she fully understands and recalls all information to be considered.  If not already done, Catherine Bullock and her family may want to discuss her wishes regarding durable power of  attorney and medical decision making, so that she can have input into these choices. If they require legal assistance with this, long-term care resource access, or other aspects of estate planning, they could reach out to [employer] at [phone] for a free consultation.   Ms. Pedro is encouraged to attend to lifestyle factors for brain health (e.g., regular physical exercise, good nutrition habits and consideration of the MIND-DASH diet, regular participation in cognitively-stimulating activities, and general stress management techniques), which are likely to have benefits for both emotional adjustment and cognition. In fact, in addition to promoting good general health, regular exercise incorporating aerobic activities (e.g., brisk walking, jogging, cycling, etc.) has been demonstrated to be a very effective treatment for depression and stress, with similar efficacy rates to both antidepressant medication and psychotherapy. Optimal control of vascular risk factors (including safe cardiovascular exercise and adherence to dietary recommendations) is encouraged. Continued participation in activities which provide mental stimulation and social interaction is also recommended.   Important information should be provided to Ms. Deckard in written format in all instances. This information should be placed in a highly frequented and easily visible location within her home to promote recall. External strategies such as written notes in a consistently used memory journal, visual and nonverbal auditory cues such as a calendar on the refrigerator or appointments with alarm, such as on a cell phone, can also help maximize recall.  When learning new information, she would benefit from information being broken up into small, manageable pieces. She may also find it helpful to articulate the material in her own words and in a context to promote encoding at the onset of a new task. This material may need to be  repeated multiple times to promote encoding.  To address problems with processing speed, she may wish to consider:   -Ensuring that she is alerted when essential material or instructions are being presented   -Adjusting the speed at which new information is presented   -Allowing for more time in comprehending, processing, and responding in conversation   -Repeating and paraphrasing instructions or conversations aloud  To address problems with fluctuating attention and/or executive dysfunction, she may wish to consider:   -Avoiding external distractions when needing to concentrate   -Limiting exposure to fast paced environments with multiple sensory demands   -Writing down complicated information and using checklists   -Attempting and completing one task at a time (i.e., no multi-tasking)   -Verbalizing aloud each step of a task to maintain focus   -Taking frequent breaks during the completion of steps/tasks to avoid fatigue   -Reducing the amount of information considered at one time   -Scheduling more difficult activities for a time of day where she is usually most alert  Review of Records:   Ms. Nesby was seen by [facility] Marland KitchenPatrcia Dolly, M.D.) for seizure follow-up on 01/14/2023. Briefly, Ms. Simoni was diagnosed with juvenile myoclonic epilepsy around age 30. Seizures usually start with myoclonic jerks that progress to a convulsion. She had been seizure-free for over 30 years until she had two seizures in April and July 2018. These appeared to be provoked by medications. She was previously on Dilantin then switched to Levetiracetam 500mg  2 tablets BID (1000mg  BID) which she has been taking since 2018 with no further seizures. She was in the ER in June 2023 for a slowed and unsteady gait. Per records, she had taken a dose of Xanax in the middle of the night, then took 2 additional doses that morning. She seemed lethargic at church. By the time of her exam, she felt better. She agreed  that she took more Xanax than prescribed. She recalled having an out of body experience. This has not recurred. Specific to cognition, she described her memory as "just like normal, forgets like everybody else." Her son has said he has to repeat himself. ADL dysfunction was denied. Performance on a brief cognitive screening instrument (MMSE) was 28/30.   She was more recently seen by Dr. Karel Jarvis for follow-up on 01/04/2024. In the interim, her family had expressed greater concern surrounding memory. She experienced a multiple myeloma relapse, is followed at [facility] for IgD lambda myeloma, and is on treatment with [id number]. 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 It was noted that a recent cognitive screening task (MOCA) score was 15/30. However, it was also highlighted that she was highly anxious during testing. Her and her husband note that their son is the one concerned the most ("he is pressing me hard"). His concerns largely surround increase repetition in day-to-day conversation. ADL dysfunction was largely denied. There was an isolated instance about 3-4 months prior where she briefly got lost while driving to a doctors appointment. There was also mention of significant anxiety which may interfere with cognitive functioning. Ultimately, Ms. Gerbino was referred for a comprehensive neuropsychological evaluation to characterize her cognitive abilities and to assist with diagnostic clarity and treatment planning.   Her most recent multiple myeloma infusion (gamunex-c/gammaked) was performed on 01/20/2024.  Neuroimaging: Brain MRI on 02/24/2017 was unremarkable. Brain MRI on 04/11/2022 suggested mild to moderate generalized cerebral atrophy and minimal microvascular ischemic disease.  Past Medical History:  Diagnosis Date   Atypical chest pain 09/30/2021   Closed Barton's fracture of left radius with routine healing  02/13/2020   Colon adenoma    Tommi Rumps Quervain's disease (radial styloid tenosynovitis) 12/10/2020   Degenerative disk disease    Essential hypertension 01/27/2021   Essential thrombocythemia 09/28/2016   Family history of early CAD 09/30/2021   Generalized anxiety disorder  11/08/2016   GERD (gastroesophageal reflux disease)    History of autologous stem cell transplant 05/03/2017   Humerus head fracture, left, with routine healing 12/10/2020   IgG monoclonal gammopathy    Iron deficiency anemia 12/14/2011   Irritable bowel syndrome 12/14/2018   Leukopenia 12/14/2011   Liver dysfunction 11/08/2016   MGUS (monoclonal gammopathy of unknown significance) 12/14/2011   IgG lambda MGUS.  Bone marrow 11/04/09.     Mixed hyperlipidemia 09/30/2021   Multiple myeloma 11/08/2016   Multiple myeloma in relapse 09/28/2016   stem cell transplant 05/2017     MVP (mitral valve prolapse)    Nonintractable juvenile myoclonic epilepsy without status epilepticus 1958   Osteoporosis    Thrombocytosis     Past Surgical History:  Procedure Laterality Date   BREAST BIOPSY  1992   benign   TONSILLECTOMY  1961    Current Outpatient Medications:    acyclovir (ZOVIRAX) 400 MG tablet, Take 400 mg by mouth 2 (two) times daily., Disp: , Rfl:    ALPRAZolam (XANAX) 0.25 MG tablet, Take 1 tablet (0.25 mg total) by mouth every 8 (eight) hours as needed., Disp: 30 tablet, Rfl: 1   Calcium Citrate-Vitamin D3 315-6.25 MG-MCG TABS, Take 2 tablets by mouth daily with breakfast., Disp: , Rfl:    cyanocobalamin (VITAMIN B12) 1000 MCG tablet, Take 1 tablet by mouth daily., Disp: , Rfl:    DULoxetine (CYMBALTA) 30 MG capsule, Take 90 mg by mouth daily., Disp: , Rfl:    levETIRAcetam (KEPPRA) 500 MG tablet, Take 2 tablets (1,000 mg total) by mouth 2 (two) times daily., Disp: 360 tablet, Rfl: 4   Multiple Vitamins-Minerals (PRESERVISION AREDS PO), Take by mouth., Disp: , Rfl:    rosuvastatin (CRESTOR) 10 MG tablet, TAKE 1  TABLET(10 MG) BY MOUTH DAILY, Disp: 15 tablet, Rfl: 0  Clinical Interview:   The following information was obtained during a clinical interview with Ms. Corning prior to cognitive testing.  Cognitive Symptoms: Decreased short-term memory: Largely denied. She acknowledged that she "may forget some things" but feels normal and would be unaware of memory concerns if others (i.e., her son and sister) had not suggested ongoing difficulties to her directly. Her husband noted some generalized forgetfulness at times, but also denied his perception of any precipitous decline or progressive worsening of abilities over time. Medical records suggest Ms. Alemu's son expressing concerns surrounding increased repetition in day-to-day conversations.  Decreased long-term memory: Denied. Decreased attention/concentration: Denied. Reduced processing speed: Denied. Difficulties with executive functions: Denied. They also denied trouble with impulsivity or any prominent personality changes.  Difficulties with emotion regulation: Denied. Difficulties with receptive language: Denied. Difficulties with word finding: Denied. Instances were said to occur "very occasionally." Decreased visuoperceptual ability: Denied.  Difficulties completing ADLs: Largely denied. Ms. Egler remains independent with medication management. She continues to drive without issue. They did acknowledge an isolated instance about 4-5 months prior where she missed her turn and was temporarily disoriented while trying to get to a doctors appointment. No other instances were described. Her husband manages finances and bill paying which is longstanding in nature. Her husband did suggest that it takes Ms. Wishon a much longer time to pick an outfit and dress herself lately. He also noted that she has trouble operating her smartphone (e.g., wanting to search her contacts but ends up scrolling through her recent calls looking for certain  individuals).  Additional Medical History: History of traumatic brain injury/concussion: Denied. History of stroke: Denied. History of seizure activity:  Endorsed (see above). Her last seizure was said to occur in 2018. Per her and her husband, this was the result of a miscalculation around her chemotherapy medication. Prior to this, no seizure activity was said to have occurred for many years.  History of known exposure to toxins: Denied. Symptoms of chronic pain: Denied. Experience of frequent headaches/migraines: Denied. Frequent instances of dizziness/vertigo: Denied.  Sensory changes: She wears glasses with benefit. No other sensory changes/difficulties were reported.  Balance/coordination difficulties: Denied. Other motor difficulties: Denied.  Sleep History: Estimated hours obtained each night: 8 hours.  Difficulties falling asleep: Denied. Difficulties staying asleep: Denied. Feels rested and refreshed upon awakening: Endorsed.  History of snoring: Denied. History of waking up gasping for air: Denied. Witnessed breath cessation while asleep: Denied.  History of vivid dreaming: Denied. Excessive movement while asleep: Denied. Her husband did describe some occasional talking in her sleep.  Instances of acting out her dreams: Denied.  Psychiatric/Behavioral Health History: Depression: She described her current mood as "good" and denied to her knowledge any prior mental health concerns or formal diagnoses. Current or remote suicidal ideation, intent, or plan was denied.  Anxiety: Denied. However, she appeared visibly anxious and did suggest acute testing anxiety during interview. Mania: Denied. Trauma History: Denied. Visual/auditory hallucinations: Denied. Delusional thoughts: Denied.  Tobacco: Denied. Alcohol: She reported infrequent alcohol consumption (occasional glass of wine with meals) and denied a history of problematic alcohol abuse or dependence. Recreational  drugs: Denied.  Family History: Problem Relation Age of Onset   Nephrolithiasis Mother    Alzheimer's disease Mother    Ulcers Mother    Prostate cancer Father    Multiple myeloma Father    Colon cancer Neg Hx    This information was confirmed by Ms. Gullickson.  Academic/Vocational History: Highest level of educational attainment: 12 years. She graduated from high school and described herself as an average (B/C) student in academic settings. Math was suggested as a potential relative weakness.  History of developmental delay: Denied. History of grade repetition: Denied. Enrollment in special education courses: Denied. History of LD/ADHD: Denied.  Employment: Retired. She worked in various capacities including as a Ecologist, Diplomatic Services operational officer, and Psychiatric nurse.   Evaluation Results:   Behavioral Observations: Ms. Mackintosh was accompanied by her husband, arrived to her appointment on time, and was appropriately dressed and groomed. She appeared alert. Observed gait and station were within normal limits. Gross motor functioning appeared intact upon informal observation and no abnormal movements (e.g., tremors) were noted. Her affect was visibly anxious and she acknowledged some acute testing anxiety during interview. Spontaneous speech was fluent and word finding difficulties were not observed during the clinical interview. Thought processes were coherent, organized, and normal in content. Insight into her cognitive difficulties appeared somewhat poor and I do question that she has adequate insight into ongoing cognitive limitations.   During testing, outward anxiety was extremely apparent. This manifested in whole body tremulous experiences that persisted from the start of testing until the very end. These tremors did impact motorized testing to a slight degree. Of note, she was observed having a conversation with a known office employee immediately after leaving the testing room where  her affect and presentation was calm, collected, and wholly unremarkable. During testing, sustained attention was appropriate. Task engagement was adequate and she persisted when challenged. Overall, Ms. Gearing was cooperative with the clinical interview and subsequent testing procedures.   Adequacy of Effort: The validity of neuropsychological testing is limited by the extent  to which the individual being tested may be assumed to have exerted adequate effort during testing. Ms. Rayle expressed her intention to perform to the best of her abilities and exhibited adequate task engagement and persistence. Scores across stand-alone and embedded performance validity measures were within expectation. As such, the results of the current evaluation are believed to be a valid representation of Ms. Rosenau's current cognitive functioning.  Test Results: Ms. Posey was mildly disoriented at the time of the current evaluation. She incorrectly stated her age 78"), was nearly two hours off when estimating the current time, and was unable to name the current clinic.  Intellectual abilities based upon educational and vocational attainment were estimated to be in the average range. Premorbid abilities were estimated to be within the average range based upon a single-word reading test.   Processing speed was variable, ranging from the exceptionally low to average normative ranges. Basic attention was above average. More complex attention (e.g., working memory) was below average. Executive functioning was variable, ranging from the exceptionally low to average normative ranges. She performed in the average range across a task assessing safety and judgment.  Assessed receptive language abilities were average. Likewise, Ms. Tostenson did not exhibit any difficulties comprehending task instructions and answered all questions asked of her appropriately. Assessed expressive language was variable. Phonemic fluency was below  average, semantic fluency was well below average, and confrontation naming was below average to average.   Assessed visuospatial/visuoconstructional abilities were exceptionally low to well below average. Across her drawing of a clock, she exhibited mild numerical spacing abnormalities and was unable to place the clock hands. Points were lost across her copy of a complex figure due to numerous but generally mild visual distortions.    Learning (i.e., encoding) of novel verbal information was exceptionally low to well below average. Spontaneous delayed recall (i.e., retrieval) of previously learned information was also exceptionally low to well below average. Retention rates were 0% across a list learning task, 43% across a story learning task, and 38% across a figure drawing task. Performance across recognition tasks was variable, ranging from the well below average to average normative ranges, suggesting some limited evidence for information consolidation.   Results of emotional screening instruments suggested that recent symptoms of generalized anxiety were in the mild range, while symptoms of depression were within normal limits. A screening instrument assessing recent sleep quality suggested the presence of minimal sleep dysfunction.  Tables of Scores:   Note: This summary of test scores accompanies the interpretive report and should not be considered in isolation without reference to the appropriate sections in the text. Descriptors are based on appropriate normative data and may be adjusted based on clinical judgment. Terms such as "Within Normal Limits" and "Outside Normal Limits" are used when a more specific description of the test score cannot be determined.       Percentile - Normative Descriptor > 98 - Exceptionally High 91-97 - Well Above Average 75-90 - Above Average 25-74 - Average 9-24 - Below Average 2-8 - Well Below Average < 2 - Exceptionally Low       Validity:   DESCRIPTOR        DCT: --- --- Within Normal Limits  RBANS EI: --- --- Within Normal Limits       Orientation:      Raw Score Percentile   NAB Orientation, Form 1 25/29 --- ---       Cognitive Screening:      Raw Score Percentile  SLUMS: 16/30 --- ---       RBANS, Form A: Standard Score/ Scaled Score Percentile   Total Score 64 1 Exceptionally Low  Immediate Memory 61 <1 Exceptionally Low    List Learning 2 <1 Exceptionally Low    Story Memory 5 5 Well Below Average  Visuospatial/Constructional 62 1 Exceptionally Low    Figure Copy 4 2 Well Below Average    Line Orientation 7/20 <2 Exceptionally Low  Language 85 16 Below Average    Picture Naming 9/10 26-50 Average    Semantic Fluency 4 2 Well Below Average  Attention 68 2 Well Below Average    Digit Span 7 16 Below Average    Coding 3 1 Exceptionally Low  Delayed Memory 82 12 Below Average    List Recall 0/10 <2 Exceptionally Low    List Recognition 19/20 26-50 Average    Story Recall 4 2 Well Below Average    Story Recognition 7/12 5-7 Well Below Average    Figure Recall 5 5 Well Below Average    Figure Recognition 3/8 4-8 Well Below Average        Intellectual Functioning:      Standard Score Percentile   Test of Premorbid Functioning: 99 47 Average       Attention/Executive Function:     Trail Making Test (TMT): Raw Score (T Score) Percentile     Part A 73 secs.,  0 errors (30) 2 Well Below Average    Part B Discontinued --- Impaired         Scaled Score Percentile   WAIS-5 Coding: 4 2 Well Below Average  WAIS-5 Naming Speed Quantity: 11 63 Average        Scaled Score Percentile   WAIS-5 Digits Forwards: 12 75 Above Average  WAIS-5 Digit Sequencing: 7 16 Below Average        Scaled Score Percentile   WAIS-5 Similarities: 10 50 Average  WAIS-5 Figure Weights: 5 5 Well Below Average       D-KEFS Verbal Fluency Test: Raw Score (Scaled Score) Percentile     Letter Total Correct 26 (7) 16 Below Average    Category  Total Correct 18 (4) 2 Well Below Average    Category Switching Total Correct 5 (2) <1 Exceptionally Low    Category Switching Accuracy 3 (2) <1 Exceptionally Low      Total Set Loss Errors 0 (13) 84 Above Average      Total Repetition Errors 1 (12) 75 Above Average       NAB Executive Functions Module, Form 1: T Score Percentile     Judgment 55 69 Average       Language:     Verbal Fluency Test: Raw Score (T Score) Percentile     Phonemic Fluency (FAS) 26 (40) 16 Below Average    Animal Fluency 11 (34) 5 Well Below Average        NAB Language Module, Form 1: T Score Percentile     Auditory Comprehension 56 73 Average    Naming 26/31 (37) 9 Below Average       Visuospatial/Visuoconstruction:      Raw Score Percentile   Clock Drawing: 6/10 --- Impaired        Scaled Score Percentile   WAIS-5 Block Design: 2 <1 Exceptionally Low       Mood and Personality:      Raw Score Percentile   Geriatric Depression Scale: 3 --- Within Normal Limits  Geriatric Anxiety  Scale: 12 --- Mild    Somatic 6 --- Mild    Cognitive 3 --- Mild    Affective 3 --- Minimal       Additional Questionnaires:      Raw Score Percentile   PROMIS Sleep Disturbance Questionnaire: 21 --- None to Slight   Informed Consent and Coding/Compliance:   The current evaluation represents a clinical evaluation for the purposes previously outlined by the referral source and is in no way reflective of a forensic evaluation.   Ms. Stonehocker was provided with a verbal description of the nature and purpose of the present neuropsychological evaluation. Also reviewed were the foreseeable risks and/or discomforts and benefits of the procedure, limits of confidentiality, and mandatory reporting requirements of this provider. The patient was given the opportunity to ask questions and receive answers about the evaluation. Oral consent to participate was provided by the patient.   This evaluation was conducted by Newman Nickels,  Ph.D., ABPP-CN, board certified clinical neuropsychologist. Ms. Padovano completed a clinical interview with Dr. Milbert Coulter, billed as one unit [phone], and 145 minutes of cognitive testing and scoring, billed as one unit [phone] and four additional units [id number]. Psychometrist Shan Levans, B.S. assisted Dr. Milbert Coulter with test administration and scoring procedures. As a separate and discrete service, one unit [id number] and two units [phone] were billed for Dr. Tammy Sours time spent in interpretation and report writing.

## 2024-01-28 ENCOUNTER — Encounter (HOSPITAL_BASED_OUTPATIENT_CLINIC_OR_DEPARTMENT_OTHER): Payer: Self-pay | Admitting: Emergency Medicine

## 2024-01-28 ENCOUNTER — Emergency Department (HOSPITAL_BASED_OUTPATIENT_CLINIC_OR_DEPARTMENT_OTHER)
Admission: EM | Admit: 2024-01-28 | Discharge: 2024-01-28 | Disposition: A | Discharge status: Home / Self Care | Attending: Emergency Medicine | Admitting: Emergency Medicine

## 2024-01-28 ENCOUNTER — Other Ambulatory Visit: Payer: Self-pay

## 2024-01-28 DIAGNOSIS — Z856 Personal history of leukemia: Secondary | ICD-10-CM | POA: Insufficient documentation

## 2024-01-28 DIAGNOSIS — H5789 Other specified disorders of eye and adnexa: Secondary | ICD-10-CM | POA: Diagnosis present

## 2024-01-28 DIAGNOSIS — I10 Essential (primary) hypertension: Secondary | ICD-10-CM | POA: Diagnosis not present

## 2024-01-28 DIAGNOSIS — H1031 Unspecified acute conjunctivitis, right eye: Secondary | ICD-10-CM | POA: Insufficient documentation

## 2024-01-28 MED ORDER — POLYMYXIN B-TRIMETHOPRIM 10000-0.1 UNIT/ML-% OP SOLN: 2.0000 [drp] | Freq: Four times a day (QID) | OPHTHALMIC | 0 refills | Status: AC

## 2024-01-28 MED ORDER — TETRACAINE HCL 0.5 % OP SOLN
2.0000 [drp] | Freq: Once | OPHTHALMIC | Status: AC
  Administered 2024-01-28: 2 [drp] via OPHTHALMIC
  Filled 2024-01-28: qty 4

## 2024-01-28 MED ORDER — FLUORESCEIN SODIUM 1 MG OP STRP
1.0000 | ORAL_STRIP | Freq: Once | OPHTHALMIC | Status: AC
Start: 2024-01-28 — End: 2024-01-28
  Administered 2024-01-28: 1 via OPHTHALMIC
  Filled 2024-01-28: qty 1

## 2024-01-28 NOTE — Discharge Instructions (Signed)
 Please follow-up with your primary care provider and ophthalmologist in regards to recent symptoms and ER visit. You have an eye infection called conjunctivitis.  This can be caused by a viral or bacterial infection, but we treat with antibiotics either way to be safe. Please use the provided antibiotics or fill the provided prescription and use as directed.  Follow up as indicated on your instructions. Return to the Emergency Department if your symptoms worsen in spite of treatment including fevers, eye swelling, redness around the eye, new pain with eye movement, new vision changes.

## 2024-01-28 NOTE — ED Triage Notes (Signed)
 Right eye "crusted" this am, for past 3 mornings. She has red inflamed eye ,no fevers

## 2024-01-28 NOTE — ED Provider Notes (Signed)
 [name] EMERGENCY DEPARTMENT AT [facility] Provider Note   CSN: [id number] Arrival date & time: 01/28/24  1247     History  Chief Complaint  Patient presents with   Eye Problem    Catherine Bullock is a 78 y.o. female history of MVP, MGUS, anemia, multiple myeloma in relapse, thrombocythemia, IBS, hypertension presented with 2 days of right eye redness.  Patient states in the morning she wakes up her eye feels glued shut.  Patient does not wear contacts.  Patient denies any swelling around the area but states she has green discharge.  Patient does not have any pain moving her eyes around.  Patient denies vision changes.  Home Medications Prior to Admission medications   Medication Sig Start Date End Date Taking? Authorizing Provider  trimethoprim-polymyxin b (POLYTRIM) ophthalmic solution Place 2 drops into the right eye every 6 (six) hours for 7 days. 01/28/24 02/04/24 Yes Cerissa Zeiger, Beverly Gust, PA-C  acyclovir (ZOVIRAX) 400 MG tablet Take 400 mg by mouth 2 (two) times daily. 10/17/20   [provider]  ALPRAZolam Prudy Feeler) 0.25 MG tablet Take 1 tablet (0.25 mg total) by mouth every 8 (eight) hours as needed. 11/08/16   Si Gaul, MD  Calcium Citrate-Vitamin D3 315-6.25 MG-MCG TABS Take 2 tablets by mouth daily with breakfast. 05/16/23   [provider]  cyanocobalamin (VITAMIN B12) 1000 MCG tablet Take 1 tablet by mouth daily. 12/06/23 12/05/24  [provider]  DULoxetine (CYMBALTA) 30 MG capsule Take 90 mg by mouth daily. 11/01/23   [provider]  levETIRAcetam (KEPPRA) 500 MG tablet Take 2 tablets (1,000 mg total) by mouth 2 (two) times daily. 01/04/24   Van Clines, MD  Multiple Vitamins-Minerals (PRESERVISION AREDS PO) Take by mouth.    [provider]  rosuvastatin (CRESTOR) 10 MG tablet TAKE 1 TABLET(10 MG) BY MOUTH DAILY 01/06/23   Jake Bathe, MD      Allergies    Thyroid hormones    Review of Systems   Review of  Systems  Physical Exam Updated Vital Signs BP (!) 140/94 (BP Location: Right Arm)   Pulse 89   Temp 98.6 F (37 C)   Resp 18   Wt 53.5 kg   SpO2 99%   BMI 20.90 kg/m  Physical Exam Constitutional:      General: She is not in acute distress. Eyes:     General: Lids are normal. Lids are everted, no foreign bodies appreciated. Vision grossly intact. Gaze aligned appropriately. No visual field deficit.       Right eye: Discharge Chilton Si) present. No foreign body or hordeolum.        Left eye: No foreign body, discharge or hordeolum.     Extraocular Movements: Extraocular movements intact.     Conjunctiva/sclera:     Right eye: Right conjunctiva is injected.     Left eye: Left conjunctiva is not injected.     Pupils: Pupils are equal, round, and reactive to light.     Right eye: No corneal abrasion or fluorescein uptake. Seidel exam negative.     Left eye: No corneal abrasion or fluorescein uptake. Seidel exam negative.    Comments: No cherry red spot noted No blood and thunder noted No erythema or edema noted around the eyes No periorbital ecchymosis noted  Neurological:     Mental Status: She is alert.     ED Results / Procedures / Treatments   Labs (all labs ordered are listed, but only  abnormal results are displayed) Labs Reviewed - No data to display  EKG None  Radiology No results found.  Procedures Procedures    Medications Ordered in ED Medications  fluorescein ophthalmic strip 1 strip (1 strip Right Eye Given 01/28/24 1417)  tetracaine (PONTOCAINE) 0.5 % ophthalmic solution 2 drop (2 drops Right Eye Given 01/28/24 1417)    ED Course/ Medical Decision Making/ A&P                                 Medical Decision Making Risk Prescription drug management.   Catherine Bullock 78 y.o. presented today for eye pain.  Working DDx that I considered at this time includes, but not limited to, preorbital/orbital cellulitis, acute glaucoma, HSV infection, open globe,  conjunctivitis, hordeolum/chalazion, FB, CRAO/CRVO.  R/o DDx: preorbital/orbital cellulitis, acute glaucoma, HSV infection, open globe, hordeolum/chalazion, FB, CRAO/CRVO: These are considered less likely due to history of present illness, physical exam, lab/imaging findings.  Review of prior external notes: 01/26/2024 office visit  Unique Tests and My Independent Interpretation:  Visual Acuity: Bilateral Near: 20/20 Bilateral Distance: 20/20 R Near: 20/20 R Distance: 20/20 L Near: 20/20 L Distance: 20/20  Fluoroscein Stain: No reuptake  Social Determinants of Health: none  Discussion with Independent Historian:  Husband  Discussion of Management of Tests: None  Risk: Medium: prescription drug management  Risk Stratification Score: None  Plan: On exam patient was in no acute distress with stable vitals. Patient does not wear contacts.  On exam patient does have obvious conjunctivitis with conjunctival injection along with green discharge on the right eye.  Vision was grossly intact and visual acuity was reassuring.  Stain of the eye was also reassuring as well and so at this time despite patient has conjunctivitis and will prescribe her Polytrim and have her follow-up with her primary care provider.  Patient does not have any concerning features on exam though necessitate further workup.  Patient was given return precautions. Patient stable for discharge at this time.  Patient verbalized understanding of plan.  This chart was dictated using voice recognition software.  Despite best efforts to proofread,  errors can occur which can change the documentation meaning.        Final Clinical Impression(s) / ED Diagnoses Final diagnoses:  Acute conjunctivitis of right eye, unspecified acute conjunctivitis type    Rx / DC Orders ED Discharge Orders          Ordered    trimethoprim-polymyxin b (POLYTRIM) ophthalmic solution  Every 6 hours        01/28/24 1412               Remi Deter 01/28/24 1433    Lorre Nick, MD 01/29/24 [phone]

## 2024-02-02 ENCOUNTER — Ambulatory Visit (INDEPENDENT_AMBULATORY_CARE_PROVIDER_SITE_OTHER): Payer: PRIVATE HEALTH INSURANCE | Admitting: Psychology

## 2024-02-02 DIAGNOSIS — F0670 Mild neurocognitive disorder due to known physiological condition without behavioral disturbance: Secondary | ICD-10-CM | POA: Diagnosis not present

## 2024-02-02 NOTE — Progress Notes (Signed)
   Neuropsychology Feedback Session Catherine Bullock. [facility] Department of Neurology  Reason for Referral:   Catherine Bullock is a 78 y.o. right-handed Caucasian female referred by Patrcia Dolly, M.D., to characterize her current cognitive functioning and assist with diagnostic clarity and treatment planning in the context of subjective cognitive decline.   Feedback:   Catherine Bullock completed a comprehensive neuropsychological evaluation on 01/26/2024. Please refer to that encounter for the full report and recommendations. Briefly, results suggested prominent impairments surrounding visuospatial abilities, as well as both encoding (i.e., learning) and delayed retrieval aspects of memory. While variable, performances across processing speed and executive functioning also exhibited notable impairment across nearly all related tasks. Variability was further exhibited across verbal fluency and recognition/consolidation aspects of memory. The cause for cognitive impairment remains uncertain. During testing, outward anxiety was extremely apparent. This manifested in whole body tremulous experiences that persisted from the start of testing until the very end. Catherine Bullock is fairly non-specific in nature. Cognitive dysfunction surrounding executive dysfunction and memory impairment is common in individuals with a history of juvenile myoclonic epilepsy (JME). Some of the current findings could represent longstanding weaknesses relating back to this condition before it was effectively managed. However, this cannot be stated with certainty as there is no testing available for comparison purposes. Her current multiple myeloma medication infusion (gamunex-c/gammaked) is generally not associated with significant cognitive dysfunction. Anti-epileptic medications are known to create generally mild cognitive cognitive dysfunction in some cases however. Prominent visuospatial impairment,  executive dysfunction, and memory weakness can be seen in various parkinsonian conditions such as Lewy body disease or corticobasal degeneration. However, she does not exhibit any behavioral features of these illnesses outside of tremors which would appear to be greatly impacted by acute anxiety. Her Bullock of deficits could reasonably be seen in a vascular/medical presentation. However, both her prior brain MRIs suggested minimal microvascular disease and no prior infarctions, making this less likely. Underlying Alzheimer's disease cannot be ruled out, especially as retention rates ranged from 0% to 43% across memory testing. However, she did benefit from cueing across some recognition trials and confrontation naming was normatively appropriately. While this illness cannot be ruled out, it also cannot be ruled in with confidence at the present time. Overall, more firm diagnostic clarity cannot be obtained presently. Time will likely need to pass to see if there is further evolution of cognitive impairment and a cognitive/behavioral Bullock becomes more easily discernible.  Catherine Bullock was accompanied by her husband during the current feedback session. Content of the current session focused on the results of her neuropsychological evaluation. Catherine Bullock was given the opportunity to ask questions and her questions were answered. She expressed interest in an updated brain MRI and a discussion surrounding medication intervention surrounding memory issues. She was advised to discuss these with Dr. Karel Jarvis directly. I also informed Dr. Karel Jarvis of Catherine Bullock interests. Catherine Bullock was encouraged to reach out should additional questions arise. A copy of her report was provided at the conclusion of the visit.      One unit [phone] was billed for Dr. Tammy Sours time spent preparing for, conducting, and documenting the current feedback session with Catherine Bullock.

## 2024-02-08 DIAGNOSIS — C9000 Multiple myeloma not having achieved remission: Secondary | ICD-10-CM | POA: Diagnosis not present

## 2024-02-08 DIAGNOSIS — C9002 Multiple myeloma in relapse: Secondary | ICD-10-CM | POA: Diagnosis not present

## 2024-02-08 DIAGNOSIS — Z006 Encounter for examination for normal comparison and control in clinical research program: Secondary | ICD-10-CM | POA: Diagnosis not present

## 2024-02-08 DIAGNOSIS — C9001 Multiple myeloma in remission: Secondary | ICD-10-CM | POA: Diagnosis not present

## 2024-02-08 DIAGNOSIS — Z5111 Encounter for antineoplastic chemotherapy: Secondary | ICD-10-CM | POA: Diagnosis not present

## 2024-02-17 DIAGNOSIS — Z5111 Encounter for antineoplastic chemotherapy: Secondary | ICD-10-CM | POA: Diagnosis not present

## 2024-02-17 DIAGNOSIS — C9000 Multiple myeloma not having achieved remission: Secondary | ICD-10-CM | POA: Diagnosis not present

## 2024-02-17 DIAGNOSIS — C9001 Multiple myeloma in remission: Secondary | ICD-10-CM | POA: Diagnosis not present

## 2024-02-17 DIAGNOSIS — Z006 Encounter for examination for normal comparison and control in clinical research program: Secondary | ICD-10-CM | POA: Diagnosis not present

## 2024-02-17 DIAGNOSIS — C9002 Multiple myeloma in relapse: Secondary | ICD-10-CM | POA: Diagnosis not present

## 2024-02-29 NOTE — Progress Notes (Signed)
 Cardiology Office Note    Patient Name: Catherine Bullock Date of Encounter: 02/29/2024  Primary Care Provider:  Faustina Hood, MD Primary Cardiologist:  None Primary Electrophysiologist: None   Past Medical History    Past Medical History:  Diagnosis Date   Atypical chest pain 09/30/2021   Closed Barton's fracture of left radius with routine healing 02/13/2020   Colon adenoma    Catherine Bullock Quervain's disease (radial styloid tenosynovitis) 12/10/2020   Degenerative disk disease    Essential hypertension 01/27/2021   Essential thrombocythemia 09/28/2016   Family history of early CAD 09/30/2021   Generalized anxiety disorder 11/08/2016   GERD (gastroesophageal reflux disease)    History of autologous stem cell transplant 05/03/2017   Humerus head fracture, left, with routine healing 12/10/2020   IgG monoclonal gammopathy    Iron deficiency anemia 12/14/2011   Irritable bowel syndrome 12/14/2018   Leukopenia 12/14/2011   Liver dysfunction 11/08/2016   MGUS (monoclonal gammopathy of unknown significance) 12/14/2011   IgG lambda MGUS.  Bone marrow 11/04/09.     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 female with a PMH of HTN, HLD, aortic atherosclerosis MGUS, family history of CAD, GERD, multiple myeloma (in remission) who presents today for overdue follow-up.  Catherine Bullock was seen initially by Dr. Renna Bullock on 09/30/2021 for evaluation of family history of heart disease and left-sided chest pain.  She underwent a calcium  score for further risk stratification that showed score of 31 which is 49% for age, race and sex.  She was started  on Crestor  10 mg once daily.  Catherine Bullock presents today with her husband for follow-up.  She was last seen in 2022 by Dr. Renna Bullock and has done well with no new cardiac complaints since her previous follow-up. Her LDL cholesterol was last checked in December and was 97, above the goal of less than 70. Triglycerides were 180, above the goal of 150 or less. She has not seen her primary care doctor in a while and has not had her cholesterol checked since December. She plans to start walking again to help manage her cholesterol levels. She has a family history of coronary disease. Her blood pressure today was noted to be 148 systolic, but she has not been monitoring it at home recently. She plans to resume walking several miles per week to help manage her blood pressure. She has a history of multiple myeloma, which is currently in remission, and she is receiving injections for it. She was advised not to take aspirin due to her myeloma history. Patient denies chest pain, palpitations, dyspnea, PND, orthopnea, nausea, vomiting, dizziness, syncope, edema, weight gain, or early satiety.  Discussed the use of AI scribe software for clinical note transcription with the patient, who gave verbal consent to proceed.  History of Present Illness   Review of Systems  Please see the history of present illness.    All other systems reviewed and are otherwise negative except as noted above.  Physical Exam    Wt Readings from Last 3 Encounters:  01/28/24 118 lb (53.5 kg)  01/04/24 119 lb 6.4 oz (54.2  kg)  01/14/23 121 lb 12.8 oz (55.2 kg)   ZO:XWRUE were no vitals filed for this visit.,There is no height or weight on file to calculate BMI. GEN: Well nourished, well developed in no acute distress Neck: No JVD; No carotid bruits Pulmonary: Clear to auscultation without rales, wheezing or rhonchi  Cardiovascular: Normal rate. Regular rhythm. Normal S1. Normal S2.   Murmurs: There is no murmur.  ABDOMEN: Soft,  non-tender, non-distended EXTREMITIES:  No edema; No deformity   EKG/LABS/ Recent Cardiac Studies   ECG personally reviewed by me today -sinus rhythm with rate of 76 bpm and no acute changes consistent with previous EKG.  Risk Assessment/Calculations:     Lab Results  Component Value Date   WBC 4.5 04/11/2022   HGB 12.2 04/11/2022   HCT 36.7 04/11/2022   MCV 95.8 04/11/2022   PLT 174 04/11/2022   Lab Results  Component Value Date   CREATININE 0.94 04/11/2022   BUN 20 04/11/2022   NA 139 04/11/2022   K 4.2 04/11/2022   CL 104 04/11/2022   CO2 24 04/11/2022   Lab Results  Component Value Date   CHOL 152 02/23/2022   HDL 65 02/23/2022   LDLCALC 68 02/23/2022   TRIG 104 02/23/2022   CHOLHDL 2.3 02/23/2022    No results found for: "HGBA1C" Assessment & Plan    1.  Aortic atherosclerosis: - CT calcium  score completed 10/2021 with calcium  score of 31 and mild aortic atherosclerosis noted - Patient currently on Crestor  10 mg with LDL still remaining elevated - Will increase Crestor  to 20 mg daily and recheck LFT and lipids in 8 weeks  2.  Essential hypertension: -Blood pressure improved to 132/78. No antihypertensive medication. Family history increases cardiovascular risk. Lifestyle modifications emphasized. - Recheck blood pressure on departure. - Encourage periodic home blood pressure monitoring. - Advise on low sodium diet and regular exercise.  3.  Hyperlipidemia: -LDL at 97 mg/dL, triglycerides at 454 mg/dL. Currently on Crestor  10 mg. Discussed increasing to 20 mg. Emphasized lifestyle modifications. - Increase Crestor  to 20 mg daily. - Order lipid panel and liver function tests in 8 weeks. - Encourage regular exercise and dietary changes such as adopting a Mediterranean or [employer].  4.  Family history of early CAD: - Father and brother both had MIs -on Crestor  20 mg advised to increase activity level to at least 150 minutes/week.  5.  History of  MVP:  Mitral valve prolapse without symptoms. Explained potential symptoms warranting further evaluation. - Monitor for symptoms such as exercise-induced fatigue or edema. - No echocardiogram needed unless symptoms develop.  Disposition: Follow-up with None or APP in 12 months   Signed, Francene Ing, Retha Cast, NP 02/29/2024, 12:37 PM [facility]

## 2024-03-01 ENCOUNTER — Ambulatory Visit: Payer: PRIVATE HEALTH INSURANCE | Attending: Nurse Practitioner | Admitting: Nurse Practitioner

## 2024-03-01 ENCOUNTER — Encounter: Payer: Self-pay | Admitting: Nurse Practitioner

## 2024-03-01 VITALS — BP 148/88 | HR 75 | Ht 63.0 in | Wt 121.0 lb

## 2024-03-01 DIAGNOSIS — Z8249 Family history of ischemic heart disease and other diseases of the circulatory system: Secondary | ICD-10-CM | POA: Diagnosis not present

## 2024-03-01 DIAGNOSIS — I700 Atherosclerosis of aorta: Secondary | ICD-10-CM

## 2024-03-01 DIAGNOSIS — I10 Essential (primary) hypertension: Secondary | ICD-10-CM | POA: Diagnosis not present

## 2024-03-01 DIAGNOSIS — E785 Hyperlipidemia, unspecified: Secondary | ICD-10-CM

## 2024-03-01 MED ORDER — ROSUVASTATIN CALCIUM 20 MG PO TABS: 20.0000 mg | ORAL_TABLET | Freq: Every day | ORAL | 1 refills | Status: DC

## 2024-03-01 NOTE — Patient Instructions (Addendum)
 Medication Instructions:  INCREASE Crestor  to 20mg  Take 1 tablet once a day  *If you need a refill on your cardiac medications before your next appointment, please call your pharmacy*  Lab Work: 8 WEEKS FASTING LIPIDS & LFTS (04/26/24 or week of) If you have labs (blood work) drawn today and your tests are completely normal, you will receive your results only by: MyChart Message (if you have MyChart) OR A paper copy in the mail If you have any lab test that is abnormal or we need to change your treatment, we will call you to review the results.  Testing/Procedures: NONE ORDERED  Follow-Up: At [facility], you and your health needs are our priority.  As part of our continuing mission to provide you with exceptional heart care, our providers are all part of one team.  This team includes your primary Cardiologist (physician) and Advanced Practice Providers or APPs (Physician Assistants and Nurse Practitioners) who all work together to provide you with the care you need, when you need it.  Your next appointment:   12 month(s)  Provider:   Dorothye Gathers, MD   We recommend signing up for the patient portal called "MyChart".  Sign up information is provided on this After Visit Summary.  MyChart is used to connect with patients for Virtual Visits (Telemedicine).  Patients are able to view lab/test results, encounter notes, upcoming appointments, etc.  Non-urgent messages can be sent to your provider as well.   To learn more about what you can do with MyChart, go to [email or web address].   Other Instructions

## 2024-03-06 ENCOUNTER — Other Ambulatory Visit: Payer: Self-pay

## 2024-03-06 DIAGNOSIS — E785 Hyperlipidemia, unspecified: Secondary | ICD-10-CM

## 2024-03-06 DIAGNOSIS — Z8249 Family history of ischemic heart disease and other diseases of the circulatory system: Secondary | ICD-10-CM

## 2024-03-06 DIAGNOSIS — I700 Atherosclerosis of aorta: Secondary | ICD-10-CM

## 2024-03-06 DIAGNOSIS — I10 Essential (primary) hypertension: Secondary | ICD-10-CM

## 2024-03-07 ENCOUNTER — Telehealth: Payer: Self-pay | Admitting: Neurology

## 2024-03-07 DIAGNOSIS — C9002 Multiple myeloma in relapse: Secondary | ICD-10-CM | POA: Diagnosis not present

## 2024-03-07 DIAGNOSIS — C9001 Multiple myeloma in remission: Secondary | ICD-10-CM | POA: Diagnosis not present

## 2024-03-07 DIAGNOSIS — C9000 Multiple myeloma not having achieved remission: Secondary | ICD-10-CM | POA: Diagnosis not present

## 2024-03-07 DIAGNOSIS — Z5111 Encounter for antineoplastic chemotherapy: Secondary | ICD-10-CM | POA: Diagnosis not present

## 2024-03-07 NOTE — Telephone Encounter (Signed)
Patient scheduled for 5/9

## 2024-03-07 NOTE — Telephone Encounter (Signed)
 Son is not on DPR but is asking for sooner appt for mom/Pt., did not give any details of why Please call husband ELYSE ZERBA [phone]

## 2024-03-07 NOTE — Telephone Encounter (Signed)
 They are asking for a sooner appointment for a follow up MRI from one 2 years ago, pt was informed that we can put her on a wait list she said that is ok,

## 2024-03-09 ENCOUNTER — Encounter: Payer: Self-pay | Admitting: Neurology

## 2024-03-09 ENCOUNTER — Other Ambulatory Visit

## 2024-03-09 ENCOUNTER — Ambulatory Visit: Admitting: Neurology

## 2024-03-09 VITALS — BP 148/87 | HR 96 | Ht 63.0 in | Wt 122.4 lb

## 2024-03-09 DIAGNOSIS — R413 Other amnesia: Secondary | ICD-10-CM | POA: Diagnosis not present

## 2024-03-09 DIAGNOSIS — F0670 Mild neurocognitive disorder due to known physiological condition without behavioral disturbance: Secondary | ICD-10-CM | POA: Diagnosis not present

## 2024-03-09 MED ORDER — DONEPEZIL HCL 5 MG PO TABS: ORAL_TABLET | ORAL | 11 refills | Status: AC

## 2024-03-09 NOTE — Progress Notes (Signed)
 NEUROLOGY FOLLOW UP OFFICE NOTE  Catherine Bullock [id number] 1946-10-31  HISTORY OF PRESENT ILLNESS: I had the pleasure of seeing Catherine Bullock in follow-up in the neurology clinic on 03/09/2024.  The patient was last seen 2 months ago for seizures and presents for an earlier visit to discuss Neuropsychological evaluation results. She is again accompanied by her husband who helps supplement the history today.  Records and images were personally reviewed where available. She presents to discuss Neuropsychological evaluation results and recommendations. Evaluation was done 12/2023 with prominent impairments surrounding visuospatial abilities, as well as both encoding and delayed retrieval aspects of memory, processing speed and executive functioning. Outward anxiety was extremely apparent. Current testing pattern is fairly non-specific, cognitive dysfunction surrounding executive function and memory impairment is common in patients with JME. Prominent visuospatial impairment, executive dysfunction, and memory weakness can be seen in parkinsonian conditions, but could also be seen in vascular disease. Underlying Alzheimer's disease could not be ruled out. Repeat MRI and medication intervention were discussed on feedback session.   She denies any headaches, dizziness, vision changes, anosmia, no falls. She has occasional constipation. Her husand notes she talks in her sleep, moving around, scratching him the other night. Once in a while she gets a little shakiness, but states "I don't have tremors." She denies getting lost driving or missing medications. She states she is "a little high strung" and asks about her anxiety medications. She feels the Duloxetine is not working, she gets really nervous and when she feels really nervous or antsy she takes Xanax . She rarely takes it, she reports taking only one dose in the past 3-6 months. Her husband reminds her she does not have refills. She asks the same question in the  office about taking medication for memory.    History on Initial Assessment 01/12/2017: This is a pleasant 78 yo RH with a history of multiple myeloma, hypertension, juvenile myoclonic epilepsy. She had her first seizure in the 9th grade, at that time attributed to sleep deprivation and stress. She had her second seizure in 1972, and had several more until 1987. She started Dilantin  in 1976 and would have breakthrough seizures due to medication compliance. She reports seizures always started with myoclonic jerks that would proceed to a convulsion. She denies any myoclonic jerks or convulsions since 1987, she has been very compliant with Dilantin  200mg  in AM, 100mg  in PM with no side effects. She and her husband deny any staring episodes. She recalls one time in 1987 when she missed doses of Dilantin , she was having some cognitive changes, then came to driving to her parents' house with no recollection of events. This has not recurred since then. She denies any olfactory/gustatory hallucinations, deja vu, rising epigastric sensation, focal numbness/tingling/weakness. She has been undergoing treatment for multiple myeloma, and was noted to have pancytopenia due to interaction of lenalidomide with her Dilantin . Per records, levels have started to recover. She was switched to Keppra  last week, instructed to stop Dilantin  and start Keppra . She has been off Dilantin  for a week and taking Keppra  500mg  BID with no side effects. She denies any myoclonic jerks or convulsions. She has minimized driving. She denies any headaches, dizziness, diplopia, dysarthria/dysphagia, neck/back pain, bowel/bladder dysfunction. No falls.   Epilepsy Risk Factors:  Her son started had 2 seizures as a teenager and is also taking Keppra . Otherwise she had a normal birth and early development.  There is no history of febrile convulsions, CNS infections such as meningitis/encephalitis, significant traumatic  brain injury, neurosurgical  procedures.  Diagnostic Data: MRI brain with and without contrast 02/24/17 which did not show any acute changes, hippocampi symmetric with no abnormal signal or enhancement seen. EEG at [facility] 10/2023: mild nonspecific slowing of the backtround, rare activation of high amplitude generalized spike polyspike and wave discharges lasting less than a second.  PAST MEDICAL HISTORY: Past Medical History:  Diagnosis Date   Atypical chest pain 09/30/2021   Closed Barton's fracture of left radius with routine healing 02/13/2020   Colon adenoma    Eddie Good Quervain's disease (radial styloid tenosynovitis) 12/10/2020   Degenerative disk disease    Essential hypertension 01/27/2021   Essential thrombocythemia 09/28/2016   Family history of early CAD 09/30/2021   Generalized anxiety disorder 11/08/2016   GERD (gastroesophageal reflux disease)    History of autologous stem cell transplant 05/03/2017   Humerus head fracture, left, with routine healing 12/10/2020   IgG monoclonal gammopathy    Iron deficiency anemia 12/14/2011   Irritable bowel syndrome 12/14/2018   Leukopenia 12/14/2011   Liver dysfunction 11/08/2016   MGUS (monoclonal gammopathy of unknown significance) 12/14/2011   IgG lambda MGUS.  Bone marrow 11/04/09.     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     ALPRAZolam  (XANAX ) 0.25 MG tablet Take 1 tablet (0.25 mg total) by mouth every 8 (eight) hours as needed. 30 tablet 1   Calcium  Citrate-Vitamin D3  315-6.25 MG-MCG TABS Take 2 tablets by mouth daily with breakfast.     cyanocobalamin  (VITAMIN B12) 1000 MCG tablet Take 1 tablet by mouth daily.     DULoxetine (CYMBALTA) 30 MG capsule Take 90 mg by mouth daily.     levETIRAcetam  (KEPPRA ) 500 MG tablet Take 2 tablets (1,000 mg total) by mouth 2 (two) times daily. 360 tablet 4   Multiple Vitamins-Minerals (PRESERVISION AREDS PO) Take by mouth.     rosuvastatin  (CRESTOR ) 20 MG tablet Take 1 tablet (20 mg total) by mouth daily. 90 tablet 1   sulfamethoxazole-trimethoprim  (BACTRIM DS) 800-160 MG tablet Take 1 tablet by mouth 3 (three) times a week.     traMADol (ULTRAM) 50 MG tablet Take 50 mg by mouth 2 (two) times daily as needed.     No current facility-administered medications on file prior to visit.    ALLERGIES: Allergies  Allergen Reactions   Thyroid  Hormones Other (See Comments)    FAMILY HISTORY: Family History  Problem Relation Age of Onset   Nephrolithiasis Mother    Alzheimer's disease Mother    Ulcers Mother    Prostate cancer Father    Multiple myeloma Father    Colon cancer Neg Hx     SOCIAL HISTORY: Social History   Socioeconomic History   Marital status: Married    Spouse name: Not on file   Number of children: Not on file   Years of education: 12   Highest education level: High school graduate  Occupational History   Occupation: Retired  Tobacco Use   Smoking status: Former  Current packs/day: 0.00    Types: Cigarettes    Quit date: 06/21/1979    Years since quitting: 44.7   Smokeless tobacco: Never  Vaping Use   Vaping status: Never Used  Substance and Sexual Activity   Alcohol use: Yes    Alcohol/week: 2.0 standard drinks of alcohol    Types: 2 Glasses of wine per week    Comment: occ   Drug use: No   Sexual activity: Yes  Other Topics Concern   Not on file  Social History Narrative   Right handed   Two story home   Drinks caffeine   Social Drivers of Health   Financial Resource  Strain: Not on file  Food Insecurity: Low Risk  (10/18/2023)   Received from [facility]   Hunger Vital Sign    Worried About Running Out of Food in the Last Year: Never true    Ran Out of Food in the Last Year: Never true  Transportation Needs: No Transportation Needs (10/18/2023)   Received from [facility]    In the past 12 months, has lack of reliable transportation kept you from medical appointments, meetings, work or from getting things needed for daily living? : No  Physical Activity: Not on file  Stress: Not on file  Social Connections: Not on file  Intimate Partner Violence: Not on file     PHYSICAL EXAM: Vitals:   03/09/24 1409  BP: (!) 148/87  Pulse: 96  SpO2: 98%   General: No acute distress Head:  Normocephalic/atraumatic Skin/Extremities: No rash, no edema Neurological Exam: alert and awake. No aphasia or dysarthria. Fund of knowledge is appropriate. Attention and concentration are reduced, she repeats the same question at least twice during the visit.  Cranial nerves: Pupils equal, round. Extraocular movements intact with no nystagmus. Visual fields full.  No facial asymmetry.  Motor: Bulk and tone normal, no cogwheeling, muscle strength 5/5 throughout with no pronator drift.   Finger to nose testing intact.  Gait narrow-based and steady, able to tandem walk adequately.  Romberg negative.   IMPRESSION: This is a pleasant 78 yo RH woman with a history of Juvenile myoclonic epilepsy, who had been seizure-free since 1987 on chronic Dilantin  therapy, until she had 2 provoked seizures in April 2018 and July 2018. She was switched to Levetiracetam  in 2018 with no further seizures, continue Levetiracetam  1000mg  BID. Recent Neuropsychological evaluation indicated Mild Cognitive Impairment, etiology unclear. No parkinsonian signs on exam. Repeat MRI recommended, we will order MRI brain without contrast to assess for underlying structural abnormality and  vascular load. We discussed starting Donepezil  5mg  1/2 tablet daily for 2 weeks, then increase to 1 tablet daily, side effects and expectations discussed. We discussed doing bloodwork for plasma biomarkers to detect levels of proteins that indicate risk for Alzheimer's disease. She was advised to speak to Dr. Felipe Horton about Duloxetine concerns and anxiety. Neuropsychological testing also recommended formal driving evaluation. Follow-up as scheduled in November, call for any changes.    Thank you for allowing me to participate in her care.  Please do not hesitate to call for any questions or concerns.    Rayfield Cairo, M.D.   CC: Dr. Felipe Horton

## 2024-03-09 NOTE — Patient Instructions (Signed)
 Good to see you again.  Schedule MRI brain without contrast  2. Have bloodwork done for AD testing  3. Start Donepezil 5mg : take 1/2 tablet daily for 2 weeks, then increase to 1 tablet daily  4. Please discuss with Dr. Felipe Horton about the Duloxetine not working, consider Psychiatry referral  5. Continue Keppra  500mg : take 2 tablets twice a day  6. Follow-up as scheduled in November, call for any changes   RECOMMENDATIONS FOR ALL PATIENTS WITH MEMORY PROBLEMS: 1. Continue to exercise (Recommend 30 minutes of walking everyday, or 3 hours every week) 2. Increase social interactions - continue going to [place] and enjoy social gatherings with friends and family 3. Eat healthy, avoid fried foods and eat more fruits and vegetables 4. Maintain adequate blood pressure, blood sugar, and blood cholesterol level. Reducing the risk of stroke and cardiovascular disease also helps promoting better memory. 5. Avoid stressful situations. Live a simple life and avoid aggravations. Organize your time and prepare for the next day in anticipation. 6. Sleep well, avoid any interruptions of sleep and avoid any distractions in the bedroom that may interfere with adequate sleep quality 7. Avoid sugar, avoid sweets as there is a strong link between excessive sugar intake, diabetes, and cognitive impairment We discussed the Mediterranean diet, which has been shown to help patients reduce the risk of progressive memory disorders and reduces cardiovascular risk. This includes eating fish, eat fruits and green leafy vegetables, nuts like almonds and hazelnuts, walnuts, and also use olive oil. Avoid fast foods and fried foods as much as possible. Avoid sweets and sugar as sugar use has been linked to worsening of memory function.  There is always a concern of gradual progression of memory problems. If this is the case, then we may need to adjust level of care according to patient needs. Support, both to the patient and  caregiver, should then be put into place.        Mediterranean Diet  Why follow it? Research shows. Those who follow the Mediterranean diet have a reduced risk of heart disease  The diet is associated with a reduced incidence of Parkinson's and Alzheimer's diseases People following the diet may have longer life expectancies and lower rates of chronic diseases  The Dietary Guidelines for Americans recommends the Mediterranean diet as an eating plan to promote health and prevent disease  What Is the Mediterranean Diet?  Healthy eating plan based on typical foods and recipes of Mediterranean-style cooking The diet is primarily a plant based diet; these foods should make up a majority of meals   Starches - Plant based foods should make up a majority of meals - They are an important sources of vitamins, minerals, energy, antioxidants, and fiber - Choose whole grains, foods high in fiber and minimally processed items  - Typical grain sources include wheat, oats, barley, corn, brown rice, bulgar, farro, millet, polenta, couscous  - Various types of beans include chickpeas, lentils, fava beans, black beans, white beans   Fruits  Veggies - Large quantities of antioxidant rich fruits & veggies; 6 or more servings  - Vegetables can be eaten raw or lightly drizzled with oil and cooked  - Vegetables common to the traditional Mediterranean Diet include: artichokes, arugula, beets, broccoli, brussel sprouts, cabbage, carrots, celery, collard greens, cucumbers, eggplant, kale, leeks, lemons, lettuce, mushrooms, okra, onions, peas, peppers, potatoes, pumpkin, radishes, rutabaga, shallots, spinach, sweet potatoes, turnips, zucchini - Fruits common to the Mediterranean Diet include: apples, apricots, avocados, cherries, clementines, dates, figs,  grapefruits, grapes, melons, nectarines, oranges, peaches, pears, pomegranates, strawberries, tangerines  Fats - Replace butter and margarine with healthy oils, such  as olive oil, canola oil, and tahini  - Limit nuts to no more than a handful a day  - Nuts include walnuts, almonds, pecans, pistachios, pine nuts  - Limit or avoid candied, honey roasted or heavily salted nuts - Olives are central to the Mediterranean diet - can be eaten whole or used in a variety of dishes   Meats Protein - Limiting red meat: no more than a few times a month - When eating red meat: choose lean cuts and keep the portion to the size of deck of cards - Eggs: approx. 0 to 4 times a week  - Fish and lean poultry: at least 2 a week  - Healthy protein sources include, chicken, [place], lean beef, lamb - Increase intake of seafood such as tuna, salmon, trout, mackerel, shrimp, scallops - Avoid or limit high fat processed meats such as sausage and bacon  Dairy - Include moderate amounts of low fat dairy products  - Focus on healthy dairy such as fat free yogurt, skim milk, low or reduced fat cheese - Limit dairy products higher in fat such as whole or 2% milk, cheese, ice cream  Alcohol - Moderate amounts of red wine is ok  - No more than 5 oz daily for women (all ages) and men older than age 78  - No more than 10 oz of wine daily for men younger than 66  Other - Limit sweets and other desserts  - Use herbs and spices instead of salt to flavor foods  - Herbs and spices common to the traditional Mediterranean Diet include: basil, bay leaves, chives, cloves, cumin, fennel, garlic, lavender, marjoram, mint, oregano, parsley, pepper, rosemary, sage, savory, sumac, tarragon, thyme   It's not just a diet, it's a lifestyle:  The Mediterranean diet includes lifestyle factors typical of those in the region  Foods, drinks and meals are best eaten with others and savored Daily physical activity is important for overall good health This could be strenuous exercise like running and aerobics This could also be more leisurely activities such as walking, housework, yard-work, or taking the  stairs Moderation is the key; a balanced and healthy diet accommodates most foods and drinks Consider portion sizes and frequency of consumption of certain foods   Meal Ideas & Options:  Breakfast:  Whole wheat toast or whole wheat English muffins with peanut butter & hard boiled egg Steel cut oats topped with apples & cinnamon and skim milk  Fresh fruit: banana, strawberries, melon, berries, peaches  Smoothies: strawberries, bananas, greek yogurt, peanut butter Low fat greek yogurt with blueberries and granola  Egg white omelet with spinach and mushrooms Breakfast couscous: whole wheat couscous, apricots, skim milk, cranberries  Sandwiches:  Hummus and grilled vegetables (peppers, zucchini, squash) on whole wheat bread   Grilled chicken on whole wheat pita with lettuce, tomatoes, cucumbers or tzatziki  [place] salad on whole wheat bread: tuna salad made with greek yogurt, olives, red peppers, capers, green onions Garlic rosemary lamb pita: lamb sauted with garlic, rosemary, salt & pepper; add lettuce, cucumber, greek yogurt to pita - flavor with lemon juice and black pepper  Seafood:  Mediterranean grilled salmon, seasoned with garlic, basil, parsley, lemon juice and black pepper Shrimp, lemon, and spinach whole-grain pasta salad made with low fat greek yogurt  Seared scallops with lemon orzo  Seared tuna steaks seasoned salt,  pepper, coriander topped with tomato mixture of olives, tomatoes, olive oil, minced garlic, parsley, green onions and cappers  Meats:  Herbed greek chicken salad with kalamata olives, cucumber, feta  Red bell peppers stuffed with spinach, bulgur, lean ground beef (or lentils) & topped with feta   Kebabs: skewers of chicken, tomatoes, onions, zucchini, squash  [place] burgers: made with red onions, mint, dill, lemon juice, feta cheese topped with roasted red peppers Vegetarian Cucumber salad: cucumbers, artichoke hearts, celery, red onion, feta cheese, tossed in olive  oil & lemon juice  Hummus and whole grain pita points with a greek salad (lettuce, tomato, feta, olives, cucumbers, red onion) Lentil soup with celery, carrots made with vegetable broth, garlic, salt and pepper  Tabouli salad: parsley, bulgur, mint, scallions, cucumbers, tomato, radishes, lemon juice, olive oil, salt and pepper.

## 2024-03-12 ENCOUNTER — Encounter: Payer: Self-pay | Admitting: Neurology

## 2024-03-16 DIAGNOSIS — Z5111 Encounter for antineoplastic chemotherapy: Secondary | ICD-10-CM | POA: Diagnosis not present

## 2024-03-16 DIAGNOSIS — C9000 Multiple myeloma not having achieved remission: Secondary | ICD-10-CM | POA: Diagnosis not present

## 2024-03-16 DIAGNOSIS — C9001 Multiple myeloma in remission: Secondary | ICD-10-CM | POA: Diagnosis not present

## 2024-03-16 DIAGNOSIS — C9002 Multiple myeloma in relapse: Secondary | ICD-10-CM | POA: Diagnosis not present

## 2024-03-20 LAB — CARDIO IQ APOE GENOTYPE

## 2024-03-20 LAB — NEUROFILAMENT LIGHT CHAIN (NFL), PLASMA: Neurofilament Light Chain (NfL), Plasma: 4.75 pg/mL (ref <12.52)

## 2024-03-20 LAB — QUEST AD-DETECT™, BETA-AMYLOID 42/40 RATIO, PLASMA
ABETA 40: 365 pg/mL
ABETA 42/40 RATIO: 0.142 — ABNORMAL LOW (ref >=0.170)
ABETA 42: 52 pg/mL

## 2024-03-20 LAB — QUEST AD-DETECT PHOSPHORYLATED TAU217(P-TAU217), PLASMA: Quest Detect PTAU217, Plasma: 1.3 pg/mL — ABNORMAL HIGH (ref <=0.15)

## 2024-03-20 LAB — QUEST AD-DETECT® PHOSPHORYLATED TAU181(P-TAU181), PLASMA: QUEST AD DETECT PTAU181, PLASMA: 2.84 pg/mL — ABNORMAL HIGH (ref <=1.07)

## 2024-03-28 DIAGNOSIS — C9001 Multiple myeloma in remission: Secondary | ICD-10-CM | POA: Diagnosis not present

## 2024-03-28 DIAGNOSIS — M7918 Myalgia, other site: Secondary | ICD-10-CM | POA: Diagnosis not present

## 2024-04-04 DIAGNOSIS — C9002 Multiple myeloma in relapse: Secondary | ICD-10-CM | POA: Diagnosis not present

## 2024-04-04 DIAGNOSIS — Z006 Encounter for examination for normal comparison and control in clinical research program: Secondary | ICD-10-CM | POA: Diagnosis not present

## 2024-04-04 DIAGNOSIS — C9001 Multiple myeloma in remission: Secondary | ICD-10-CM | POA: Diagnosis not present

## 2024-04-11 DIAGNOSIS — H0102A Squamous blepharitis right eye, upper and lower eyelids: Secondary | ICD-10-CM | POA: Diagnosis not present

## 2024-04-11 DIAGNOSIS — H353131 Nonexudative age-related macular degeneration, bilateral, early dry stage: Secondary | ICD-10-CM | POA: Diagnosis not present

## 2024-04-11 DIAGNOSIS — H43822 Vitreomacular adhesion, left eye: Secondary | ICD-10-CM | POA: Diagnosis not present

## 2024-04-11 DIAGNOSIS — H25813 Combined forms of age-related cataract, bilateral: Secondary | ICD-10-CM | POA: Diagnosis not present

## 2024-04-11 DIAGNOSIS — H0102B Squamous blepharitis left eye, upper and lower eyelids: Secondary | ICD-10-CM | POA: Diagnosis not present

## 2024-04-11 DIAGNOSIS — H5203 Hypermetropia, bilateral: Secondary | ICD-10-CM | POA: Diagnosis not present

## 2024-04-11 DIAGNOSIS — H16203 Unspecified keratoconjunctivitis, bilateral: Secondary | ICD-10-CM | POA: Diagnosis not present

## 2024-04-11 DIAGNOSIS — H43813 Vitreous degeneration, bilateral: Secondary | ICD-10-CM | POA: Diagnosis not present

## 2024-04-11 DIAGNOSIS — H35342 Macular cyst, hole, or pseudohole, left eye: Secondary | ICD-10-CM | POA: Diagnosis not present

## 2024-04-11 DIAGNOSIS — H52223 Regular astigmatism, bilateral: Secondary | ICD-10-CM | POA: Diagnosis not present

## 2024-04-11 DIAGNOSIS — H524 Presbyopia: Secondary | ICD-10-CM | POA: Diagnosis not present

## 2024-04-13 DIAGNOSIS — C9002 Multiple myeloma in relapse: Secondary | ICD-10-CM | POA: Diagnosis not present

## 2024-04-13 DIAGNOSIS — Z006 Encounter for examination for normal comparison and control in clinical research program: Secondary | ICD-10-CM | POA: Diagnosis not present

## 2024-04-13 DIAGNOSIS — C9001 Multiple myeloma in remission: Secondary | ICD-10-CM | POA: Diagnosis not present

## 2024-04-17 DIAGNOSIS — M25551 Pain in right hip: Secondary | ICD-10-CM | POA: Diagnosis not present

## 2024-04-18 ENCOUNTER — Ambulatory Visit
Admission: RE | Admit: 2024-04-18 | Discharge: 2024-04-18 | Disposition: A | Discharge status: Home / Self Care | Source: Ambulatory Visit | Attending: Neurology | Admitting: Neurology

## 2024-04-18 DIAGNOSIS — G309 Alzheimer's disease, unspecified: Secondary | ICD-10-CM | POA: Diagnosis not present

## 2024-04-18 DIAGNOSIS — F0670 Mild neurocognitive disorder due to known physiological condition without behavioral disturbance: Secondary | ICD-10-CM

## 2024-04-18 DIAGNOSIS — G319 Degenerative disease of nervous system, unspecified: Secondary | ICD-10-CM | POA: Diagnosis not present

## 2024-04-18 DIAGNOSIS — I6782 Cerebral ischemia: Secondary | ICD-10-CM | POA: Diagnosis not present

## 2024-04-18 MED ORDER — GADOPICLENOL 0.5 MMOL/ML IV SOLN
6.0000 mL | Freq: Once | INTRAVENOUS | Status: AC | PRN
  Administered 2024-04-18: 6 mL via INTRAVENOUS

## 2024-04-19 DIAGNOSIS — R4589 Other symptoms and signs involving emotional state: Secondary | ICD-10-CM | POA: Diagnosis not present

## 2024-04-19 DIAGNOSIS — F02A4 Dementia in other diseases classified elsewhere, mild, with anxiety: Secondary | ICD-10-CM | POA: Diagnosis not present

## 2024-04-19 DIAGNOSIS — G301 Alzheimer's disease with late onset: Secondary | ICD-10-CM | POA: Diagnosis not present

## 2024-04-23 DIAGNOSIS — M25551 Pain in right hip: Secondary | ICD-10-CM | POA: Diagnosis not present

## 2024-04-27 ENCOUNTER — Encounter: Payer: Self-pay | Admitting: Neurology

## 2024-05-09 DIAGNOSIS — Z5111 Encounter for antineoplastic chemotherapy: Secondary | ICD-10-CM | POA: Diagnosis not present

## 2024-05-09 DIAGNOSIS — C9002 Multiple myeloma in relapse: Secondary | ICD-10-CM | POA: Diagnosis not present

## 2024-05-09 DIAGNOSIS — Z006 Encounter for examination for normal comparison and control in clinical research program: Secondary | ICD-10-CM | POA: Diagnosis not present

## 2024-05-09 DIAGNOSIS — D801 Nonfamilial hypogammaglobulinemia: Secondary | ICD-10-CM | POA: Diagnosis not present

## 2024-05-09 DIAGNOSIS — C9001 Multiple myeloma in remission: Secondary | ICD-10-CM | POA: Diagnosis not present

## 2024-05-09 DIAGNOSIS — Z5112 Encounter for antineoplastic immunotherapy: Secondary | ICD-10-CM | POA: Diagnosis not present

## 2024-05-16 DIAGNOSIS — Z5112 Encounter for antineoplastic immunotherapy: Secondary | ICD-10-CM | POA: Diagnosis not present

## 2024-05-16 DIAGNOSIS — C9001 Multiple myeloma in remission: Secondary | ICD-10-CM | POA: Diagnosis not present

## 2024-05-16 DIAGNOSIS — C9002 Multiple myeloma in relapse: Secondary | ICD-10-CM | POA: Diagnosis not present

## 2024-05-16 DIAGNOSIS — D801 Nonfamilial hypogammaglobulinemia: Secondary | ICD-10-CM | POA: Diagnosis not present

## 2024-05-16 DIAGNOSIS — Z5111 Encounter for antineoplastic chemotherapy: Secondary | ICD-10-CM | POA: Diagnosis not present

## 2024-05-18 DIAGNOSIS — H35342 Macular cyst, hole, or pseudohole, left eye: Secondary | ICD-10-CM | POA: Diagnosis not present

## 2024-05-18 DIAGNOSIS — H2513 Age-related nuclear cataract, bilateral: Secondary | ICD-10-CM | POA: Diagnosis not present

## 2024-05-28 DIAGNOSIS — M25551 Pain in right hip: Secondary | ICD-10-CM | POA: Diagnosis not present

## 2024-06-06 DIAGNOSIS — Z006 Encounter for examination for normal comparison and control in clinical research program: Secondary | ICD-10-CM | POA: Diagnosis not present

## 2024-06-06 DIAGNOSIS — C9000 Multiple myeloma not having achieved remission: Secondary | ICD-10-CM | POA: Diagnosis not present

## 2024-06-06 DIAGNOSIS — C9001 Multiple myeloma in remission: Secondary | ICD-10-CM | POA: Diagnosis not present

## 2024-06-06 DIAGNOSIS — Z5111 Encounter for antineoplastic chemotherapy: Secondary | ICD-10-CM | POA: Diagnosis not present

## 2024-06-06 DIAGNOSIS — R109 Unspecified abdominal pain: Secondary | ICD-10-CM | POA: Diagnosis not present

## 2024-06-06 DIAGNOSIS — C9002 Multiple myeloma in relapse: Secondary | ICD-10-CM | POA: Diagnosis not present

## 2024-06-13 DIAGNOSIS — R109 Unspecified abdominal pain: Secondary | ICD-10-CM | POA: Diagnosis not present

## 2024-06-13 DIAGNOSIS — Z006 Encounter for examination for normal comparison and control in clinical research program: Secondary | ICD-10-CM | POA: Diagnosis not present

## 2024-06-13 DIAGNOSIS — C9001 Multiple myeloma in remission: Secondary | ICD-10-CM | POA: Diagnosis not present

## 2024-06-13 DIAGNOSIS — Z5111 Encounter for antineoplastic chemotherapy: Secondary | ICD-10-CM | POA: Diagnosis not present

## 2024-06-13 DIAGNOSIS — C9002 Multiple myeloma in relapse: Secondary | ICD-10-CM | POA: Diagnosis not present

## 2024-06-13 DIAGNOSIS — C9000 Multiple myeloma not having achieved remission: Secondary | ICD-10-CM | POA: Diagnosis not present

## 2024-06-14 DIAGNOSIS — I10 Essential (primary) hypertension: Secondary | ICD-10-CM | POA: Diagnosis not present

## 2024-06-14 DIAGNOSIS — E785 Hyperlipidemia, unspecified: Secondary | ICD-10-CM | POA: Diagnosis not present

## 2024-06-14 DIAGNOSIS — I700 Atherosclerosis of aorta: Secondary | ICD-10-CM | POA: Diagnosis not present

## 2024-06-14 DIAGNOSIS — Z8249 Family history of ischemic heart disease and other diseases of the circulatory system: Secondary | ICD-10-CM | POA: Diagnosis not present

## 2024-06-15 ENCOUNTER — Ambulatory Visit: Payer: Self-pay | Admitting: General Practice

## 2024-06-15 DIAGNOSIS — E785 Hyperlipidemia, unspecified: Secondary | ICD-10-CM

## 2024-06-15 LAB — HEPATIC FUNCTION PANEL
ALT: 15 IU/L (ref 0–32)
AST: 21 IU/L (ref 0–40)
Albumin: 4.2 g/dL (ref 3.8–4.8)
Alkaline Phosphatase: 86 IU/L (ref 44–121)
Bilirubin Total: <0.2 mg/dL (ref 0.0–1.2)
Bilirubin, Direct: 0.1 mg/dL (ref 0.00–0.40)
Total Protein: 6.5 g/dL (ref 6.0–8.5)

## 2024-06-15 LAB — LIPID PANEL
Chol/HDL Ratio: 2.4 ratio (ref 0.0–4.4)
Cholesterol, Total: 128 mg/dL (ref 100–199)
HDL: 54 mg/dL (ref >39)
LDL Chol Calc (NIH): 39 mg/dL (ref 0–99)
Triglycerides: 227 mg/dL — ABNORMAL HIGH (ref 0–149)
VLDL Cholesterol Cal: 35 mg/dL (ref 5–40)

## 2024-06-20 DIAGNOSIS — Z006 Encounter for examination for normal comparison and control in clinical research program: Secondary | ICD-10-CM | POA: Diagnosis not present

## 2024-06-20 DIAGNOSIS — Z5111 Encounter for antineoplastic chemotherapy: Secondary | ICD-10-CM | POA: Diagnosis not present

## 2024-06-20 DIAGNOSIS — C9001 Multiple myeloma in remission: Secondary | ICD-10-CM | POA: Diagnosis not present

## 2024-06-20 DIAGNOSIS — C9000 Multiple myeloma not having achieved remission: Secondary | ICD-10-CM | POA: Diagnosis not present

## 2024-06-20 DIAGNOSIS — R109 Unspecified abdominal pain: Secondary | ICD-10-CM | POA: Diagnosis not present

## 2024-06-20 DIAGNOSIS — C9002 Multiple myeloma in relapse: Secondary | ICD-10-CM | POA: Diagnosis not present

## 2024-07-11 DIAGNOSIS — Z006 Encounter for examination for normal comparison and control in clinical research program: Secondary | ICD-10-CM | POA: Diagnosis not present

## 2024-07-11 DIAGNOSIS — B348 Other viral infections of unspecified site: Secondary | ICD-10-CM | POA: Diagnosis not present

## 2024-07-11 DIAGNOSIS — F419 Anxiety disorder, unspecified: Secondary | ICD-10-CM | POA: Diagnosis not present

## 2024-07-11 DIAGNOSIS — Z5111 Encounter for antineoplastic chemotherapy: Secondary | ICD-10-CM | POA: Diagnosis not present

## 2024-07-11 DIAGNOSIS — D649 Anemia, unspecified: Secondary | ICD-10-CM | POA: Diagnosis not present

## 2024-07-11 DIAGNOSIS — E559 Vitamin D deficiency, unspecified: Secondary | ICD-10-CM | POA: Diagnosis not present

## 2024-07-11 DIAGNOSIS — D801 Nonfamilial hypogammaglobulinemia: Secondary | ICD-10-CM | POA: Diagnosis not present

## 2024-07-11 DIAGNOSIS — C9001 Multiple myeloma in remission: Secondary | ICD-10-CM | POA: Diagnosis not present

## 2024-07-11 DIAGNOSIS — F0280 Dementia in other diseases classified elsewhere without behavioral disturbance: Secondary | ICD-10-CM | POA: Diagnosis not present

## 2024-07-11 DIAGNOSIS — Z9484 Stem cells transplant status: Secondary | ICD-10-CM | POA: Diagnosis not present

## 2024-07-11 DIAGNOSIS — D709 Neutropenia, unspecified: Secondary | ICD-10-CM | POA: Diagnosis not present

## 2024-07-11 DIAGNOSIS — R052 Subacute cough: Secondary | ICD-10-CM | POA: Diagnosis not present

## 2024-07-11 DIAGNOSIS — E538 Deficiency of other specified B group vitamins: Secondary | ICD-10-CM | POA: Diagnosis not present

## 2024-07-11 DIAGNOSIS — G300 Alzheimer's disease with early onset: Secondary | ICD-10-CM | POA: Diagnosis not present

## 2024-07-11 DIAGNOSIS — M810 Age-related osteoporosis without current pathological fracture: Secondary | ICD-10-CM | POA: Diagnosis not present

## 2024-07-11 DIAGNOSIS — C9002 Multiple myeloma in relapse: Secondary | ICD-10-CM | POA: Diagnosis not present

## 2024-07-11 DIAGNOSIS — D473 Essential (hemorrhagic) thrombocythemia: Secondary | ICD-10-CM | POA: Diagnosis not present

## 2024-07-18 DIAGNOSIS — Z9484 Stem cells transplant status: Secondary | ICD-10-CM | POA: Diagnosis not present

## 2024-07-18 DIAGNOSIS — C9002 Multiple myeloma in relapse: Secondary | ICD-10-CM | POA: Diagnosis not present

## 2024-07-18 DIAGNOSIS — D649 Anemia, unspecified: Secondary | ICD-10-CM | POA: Diagnosis not present

## 2024-07-18 DIAGNOSIS — E538 Deficiency of other specified B group vitamins: Secondary | ICD-10-CM | POA: Diagnosis not present

## 2024-07-18 DIAGNOSIS — C9001 Multiple myeloma in remission: Secondary | ICD-10-CM | POA: Diagnosis not present

## 2024-07-18 DIAGNOSIS — Z006 Encounter for examination for normal comparison and control in clinical research program: Secondary | ICD-10-CM | POA: Diagnosis not present

## 2024-07-18 DIAGNOSIS — D709 Neutropenia, unspecified: Secondary | ICD-10-CM | POA: Diagnosis not present

## 2024-07-18 DIAGNOSIS — B348 Other viral infections of unspecified site: Secondary | ICD-10-CM | POA: Diagnosis not present

## 2024-07-18 DIAGNOSIS — E559 Vitamin D deficiency, unspecified: Secondary | ICD-10-CM | POA: Diagnosis not present

## 2024-07-18 DIAGNOSIS — G300 Alzheimer's disease with early onset: Secondary | ICD-10-CM | POA: Diagnosis not present

## 2024-07-18 DIAGNOSIS — F0280 Dementia in other diseases classified elsewhere without behavioral disturbance: Secondary | ICD-10-CM | POA: Diagnosis not present

## 2024-07-18 DIAGNOSIS — Z5111 Encounter for antineoplastic chemotherapy: Secondary | ICD-10-CM | POA: Diagnosis not present

## 2024-07-20 DIAGNOSIS — D709 Neutropenia, unspecified: Secondary | ICD-10-CM | POA: Diagnosis not present

## 2024-07-20 DIAGNOSIS — Z5111 Encounter for antineoplastic chemotherapy: Secondary | ICD-10-CM | POA: Diagnosis not present

## 2024-07-20 DIAGNOSIS — B348 Other viral infections of unspecified site: Secondary | ICD-10-CM | POA: Diagnosis not present

## 2024-07-20 DIAGNOSIS — Z9484 Stem cells transplant status: Secondary | ICD-10-CM | POA: Diagnosis not present

## 2024-07-20 DIAGNOSIS — G300 Alzheimer's disease with early onset: Secondary | ICD-10-CM | POA: Diagnosis not present

## 2024-07-20 DIAGNOSIS — Z006 Encounter for examination for normal comparison and control in clinical research program: Secondary | ICD-10-CM | POA: Diagnosis not present

## 2024-07-20 DIAGNOSIS — E538 Deficiency of other specified B group vitamins: Secondary | ICD-10-CM | POA: Diagnosis not present

## 2024-07-20 DIAGNOSIS — F0280 Dementia in other diseases classified elsewhere without behavioral disturbance: Secondary | ICD-10-CM | POA: Diagnosis not present

## 2024-07-20 DIAGNOSIS — D649 Anemia, unspecified: Secondary | ICD-10-CM | POA: Diagnosis not present

## 2024-07-20 DIAGNOSIS — C9001 Multiple myeloma in remission: Secondary | ICD-10-CM | POA: Diagnosis not present

## 2024-07-20 DIAGNOSIS — E559 Vitamin D deficiency, unspecified: Secondary | ICD-10-CM | POA: Diagnosis not present

## 2024-07-20 DIAGNOSIS — C9002 Multiple myeloma in relapse: Secondary | ICD-10-CM | POA: Diagnosis not present

## 2024-07-25 DIAGNOSIS — E538 Deficiency of other specified B group vitamins: Secondary | ICD-10-CM | POA: Diagnosis not present

## 2024-07-25 DIAGNOSIS — B348 Other viral infections of unspecified site: Secondary | ICD-10-CM | POA: Diagnosis not present

## 2024-07-25 DIAGNOSIS — Z006 Encounter for examination for normal comparison and control in clinical research program: Secondary | ICD-10-CM | POA: Diagnosis not present

## 2024-07-25 DIAGNOSIS — C9002 Multiple myeloma in relapse: Secondary | ICD-10-CM | POA: Diagnosis not present

## 2024-07-25 DIAGNOSIS — C9001 Multiple myeloma in remission: Secondary | ICD-10-CM | POA: Diagnosis not present

## 2024-07-25 DIAGNOSIS — E559 Vitamin D deficiency, unspecified: Secondary | ICD-10-CM | POA: Diagnosis not present

## 2024-07-25 DIAGNOSIS — F0280 Dementia in other diseases classified elsewhere without behavioral disturbance: Secondary | ICD-10-CM | POA: Diagnosis not present

## 2024-07-25 DIAGNOSIS — Z9484 Stem cells transplant status: Secondary | ICD-10-CM | POA: Diagnosis not present

## 2024-07-25 DIAGNOSIS — G300 Alzheimer's disease with early onset: Secondary | ICD-10-CM | POA: Diagnosis not present

## 2024-07-25 DIAGNOSIS — D649 Anemia, unspecified: Secondary | ICD-10-CM | POA: Diagnosis not present

## 2024-07-25 DIAGNOSIS — Z5111 Encounter for antineoplastic chemotherapy: Secondary | ICD-10-CM | POA: Diagnosis not present

## 2024-07-25 DIAGNOSIS — D709 Neutropenia, unspecified: Secondary | ICD-10-CM | POA: Diagnosis not present

## 2024-08-17 DIAGNOSIS — C9001 Multiple myeloma in remission: Secondary | ICD-10-CM | POA: Diagnosis not present

## 2024-08-17 DIAGNOSIS — Z006 Encounter for examination for normal comparison and control in clinical research program: Secondary | ICD-10-CM | POA: Diagnosis not present

## 2024-08-17 DIAGNOSIS — C9002 Multiple myeloma in relapse: Secondary | ICD-10-CM | POA: Diagnosis not present

## 2024-08-17 DIAGNOSIS — Z5111 Encounter for antineoplastic chemotherapy: Secondary | ICD-10-CM | POA: Diagnosis not present

## 2024-08-22 DIAGNOSIS — R918 Other nonspecific abnormal finding of lung field: Secondary | ICD-10-CM | POA: Diagnosis not present

## 2024-08-22 DIAGNOSIS — Z5111 Encounter for antineoplastic chemotherapy: Secondary | ICD-10-CM | POA: Diagnosis not present

## 2024-08-22 DIAGNOSIS — Z9484 Stem cells transplant status: Secondary | ICD-10-CM | POA: Diagnosis not present

## 2024-08-22 DIAGNOSIS — R911 Solitary pulmonary nodule: Secondary | ICD-10-CM | POA: Diagnosis not present

## 2024-08-22 DIAGNOSIS — B348 Other viral infections of unspecified site: Secondary | ICD-10-CM | POA: Diagnosis not present

## 2024-08-22 DIAGNOSIS — Z006 Encounter for examination for normal comparison and control in clinical research program: Secondary | ICD-10-CM | POA: Diagnosis not present

## 2024-08-22 DIAGNOSIS — D649 Anemia, unspecified: Secondary | ICD-10-CM | POA: Diagnosis not present

## 2024-08-22 DIAGNOSIS — C9001 Multiple myeloma in remission: Secondary | ICD-10-CM | POA: Diagnosis not present

## 2024-08-22 DIAGNOSIS — D709 Neutropenia, unspecified: Secondary | ICD-10-CM | POA: Diagnosis not present

## 2024-08-22 DIAGNOSIS — C9002 Multiple myeloma in relapse: Secondary | ICD-10-CM | POA: Diagnosis not present

## 2024-09-07 ENCOUNTER — Ambulatory Visit: Payer: PRIVATE HEALTH INSURANCE | Admitting: Neurology

## 2024-09-12 ENCOUNTER — Other Ambulatory Visit: Payer: Self-pay | Admitting: Nurse Practitioner

## 2024-09-14 DIAGNOSIS — Z006 Encounter for examination for normal comparison and control in clinical research program: Secondary | ICD-10-CM | POA: Diagnosis not present

## 2024-09-14 DIAGNOSIS — C9002 Multiple myeloma in relapse: Secondary | ICD-10-CM | POA: Diagnosis not present

## 2024-09-14 DIAGNOSIS — C9001 Multiple myeloma in remission: Secondary | ICD-10-CM | POA: Diagnosis not present

## 2024-09-17 DIAGNOSIS — R918 Other nonspecific abnormal finding of lung field: Secondary | ICD-10-CM | POA: Diagnosis not present

## 2024-09-19 ENCOUNTER — Encounter: Payer: Self-pay | Admitting: Neurology

## 2024-09-19 DIAGNOSIS — Z006 Encounter for examination for normal comparison and control in clinical research program: Secondary | ICD-10-CM | POA: Diagnosis not present

## 2024-09-19 DIAGNOSIS — C9002 Multiple myeloma in relapse: Secondary | ICD-10-CM | POA: Diagnosis not present

## 2024-09-19 DIAGNOSIS — C9001 Multiple myeloma in remission: Secondary | ICD-10-CM | POA: Diagnosis not present

## 2024-10-02 DIAGNOSIS — K219 Gastro-esophageal reflux disease without esophagitis: Secondary | ICD-10-CM | POA: Diagnosis not present

## 2024-10-02 DIAGNOSIS — R0982 Postnasal drip: Secondary | ICD-10-CM | POA: Diagnosis not present

## 2024-10-02 DIAGNOSIS — C9001 Multiple myeloma in remission: Secondary | ICD-10-CM | POA: Diagnosis not present

## 2024-10-02 DIAGNOSIS — R053 Chronic cough: Secondary | ICD-10-CM | POA: Diagnosis not present

## 2024-10-24 ENCOUNTER — Institutional Professional Consult (permissible substitution): Payer: PRIVATE HEALTH INSURANCE | Admitting: Psychology

## 2024-10-24 ENCOUNTER — Ambulatory Visit: Payer: Self-pay

## 2024-11-12 ENCOUNTER — Encounter: Payer: PRIVATE HEALTH INSURANCE | Admitting: Psychology

## 2025-01-03 ENCOUNTER — Ambulatory Visit: Payer: PRIVATE HEALTH INSURANCE | Admitting: Neurology

## 2025-02-05 ENCOUNTER — Ambulatory Visit: Payer: Self-pay

## 2025-02-05 ENCOUNTER — Institutional Professional Consult (permissible substitution): Payer: PRIVATE HEALTH INSURANCE | Admitting: Psychology

## 2025-02-12 ENCOUNTER — Encounter: Payer: PRIVATE HEALTH INSURANCE | Admitting: Psychology

## 2025-04-04 ENCOUNTER — Ambulatory Visit: Admitting: Neurology

## 2025-04-16 ENCOUNTER — Ambulatory Visit: Payer: Self-pay

## 2025-04-16 ENCOUNTER — Institutional Professional Consult (permissible substitution): Payer: PRIVATE HEALTH INSURANCE | Admitting: Psychology
